# Patient Record
Sex: Male | Born: 1945 | Race: White | Hispanic: No | Marital: Married | Smoking: Former smoker
Health system: Southern US, Community
[De-identification: ages and names within clinical notes are randomized; demographics above are authoritative.]

## PROBLEM LIST (undated history)

## (undated) DIAGNOSIS — R140 Abdominal distension (gaseous): Secondary | ICD-10-CM

## (undated) DIAGNOSIS — R7301 Impaired fasting glucose: Secondary | ICD-10-CM

## (undated) DIAGNOSIS — E162 Hypoglycemia, unspecified: Secondary | ICD-10-CM

## (undated) DIAGNOSIS — G479 Sleep disorder, unspecified: Secondary | ICD-10-CM

## (undated) DIAGNOSIS — Z953 Presence of xenogenic heart valve: Secondary | ICD-10-CM

## (undated) DIAGNOSIS — R0683 Snoring: Secondary | ICD-10-CM

## (undated) DIAGNOSIS — R5383 Other fatigue: Secondary | ICD-10-CM

## (undated) DIAGNOSIS — K900 Celiac disease: Secondary | ICD-10-CM

## (undated) DIAGNOSIS — G4730 Sleep apnea, unspecified: Secondary | ICD-10-CM

## (undated) DIAGNOSIS — G4710 Hypersomnia, unspecified: Secondary | ICD-10-CM

## (undated) HISTORY — DX: Sleep disorder, unspecified: G47.9

## (undated) HISTORY — DX: Other fatigue: R53.83

## (undated) HISTORY — DX: Impaired fasting glucose: R73.01

## (undated) HISTORY — PX: TUMOR EXCISION: SHX421

## (undated) HISTORY — DX: Hypoglycemia, unspecified: E16.2

## (undated) HISTORY — DX: Sleep apnea, unspecified: G47.30

## (undated) HISTORY — DX: Hypersomnia, unspecified: G47.10

## (undated) HISTORY — DX: Snoring: R06.83

## (undated) HISTORY — DX: Abdominal distension (gaseous): R14.0

---

## 2006-10-24 ENCOUNTER — Emergency Department: Payer: Self-pay | Admitting: Internal Medicine

## 2006-10-24 ENCOUNTER — Other Ambulatory Visit: Payer: Self-pay

## 2006-11-08 ENCOUNTER — Other Ambulatory Visit: Payer: Self-pay

## 2006-11-08 ENCOUNTER — Emergency Department: Payer: Self-pay | Admitting: Emergency Medicine

## 2006-11-12 HISTORY — PX: AORTIC VALVE REPAIR: SHX6306

## 2007-04-24 DIAGNOSIS — K589 Irritable bowel syndrome without diarrhea: Secondary | ICD-10-CM | POA: Insufficient documentation

## 2007-04-24 DIAGNOSIS — F411 Generalized anxiety disorder: Secondary | ICD-10-CM | POA: Insufficient documentation

## 2008-02-08 IMAGING — CT CT HEAD WITHOUT CONTRAST
2 series · 16 of 30 positions shown, 20 images · non-contrast
Comparison: none

REASON FOR EXAM: Dizziness
COMMENTS:

PROCEDURE:     CT  - CT HEAD WITHOUT CONTRAST  - October 24, 2006 [DATE]
RESULT:
HISTORY: Dizziness.

[Series 2: without · axial · non-contrast · 0.41mm/px · z∈[+918,+1038]mm · 13 of 30 slices shown, 17 images]
[im 3/30  brain]
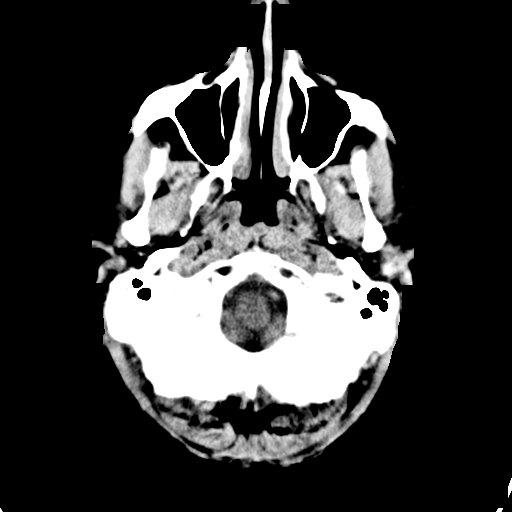
[im 3/30  bone]
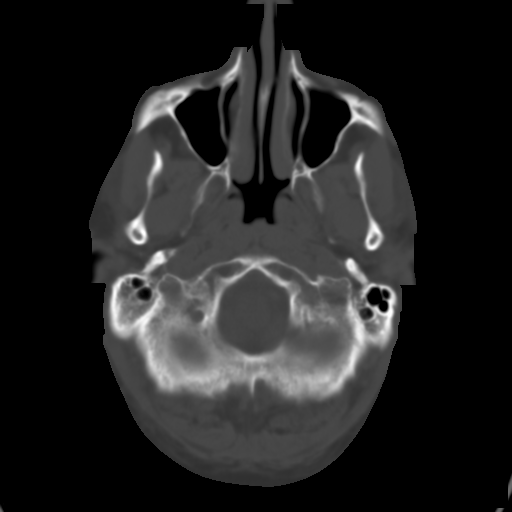
[im 5/30  brain]
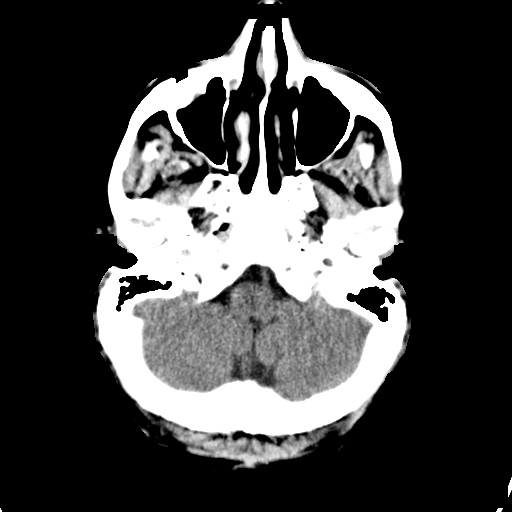
[im 7/30  brain]
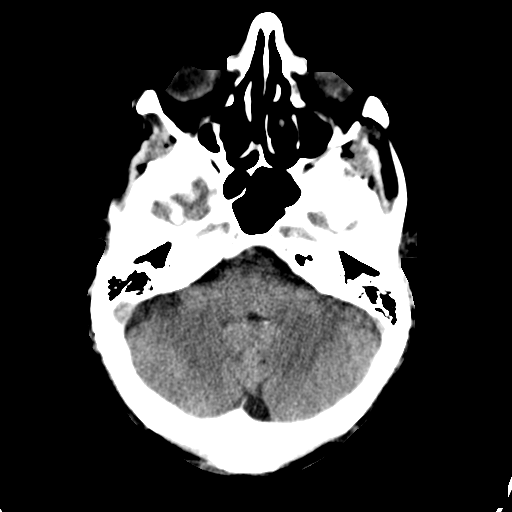
[im 9/30  brain]
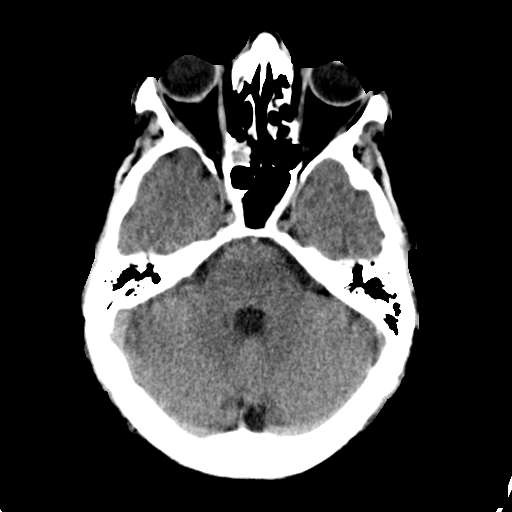
[im 11/30  brain]
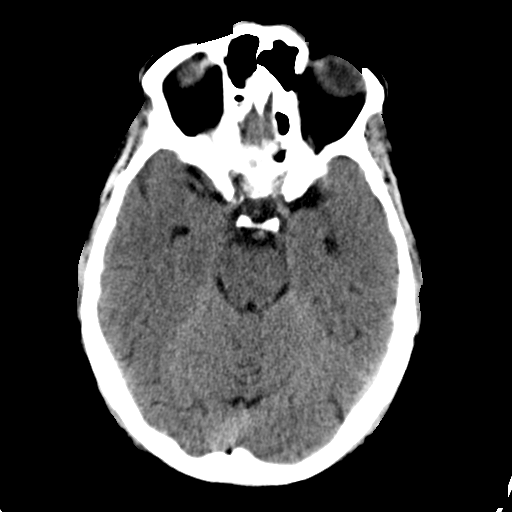
[im 11/30  bone]
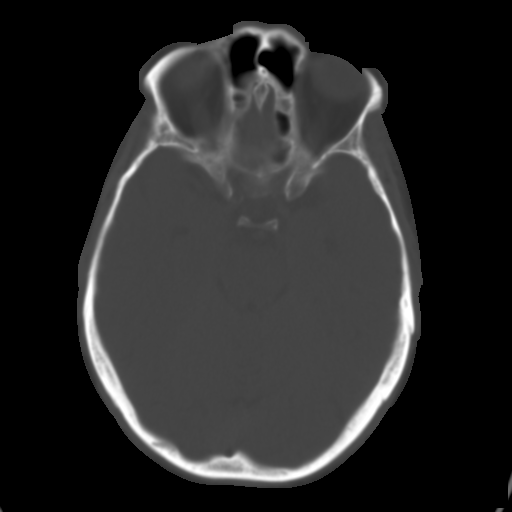
[im 13/30  brain]
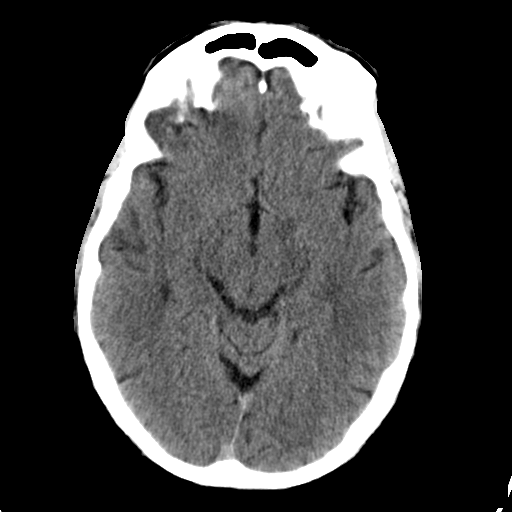
[im 15/30  brain]
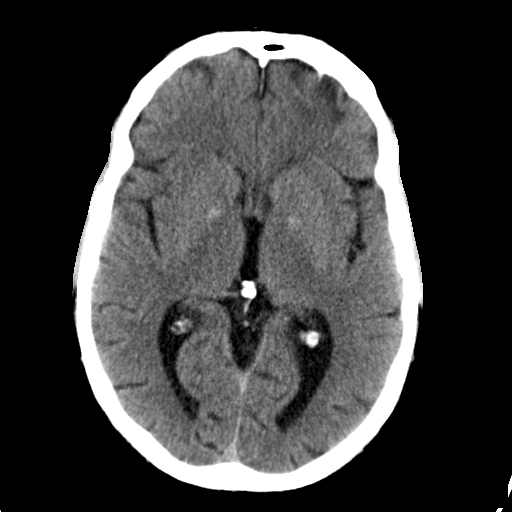
[im 17/30  brain]
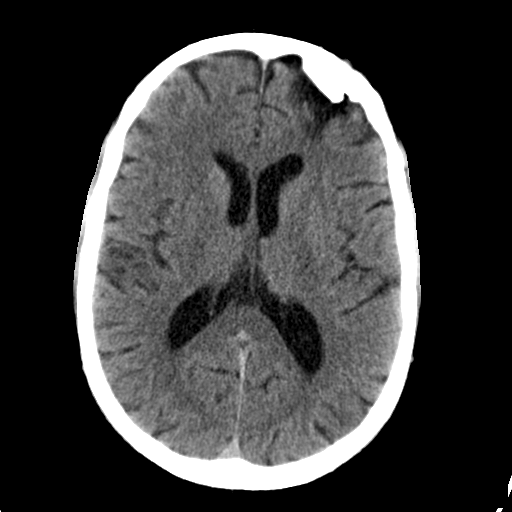
[im 19/30  brain]
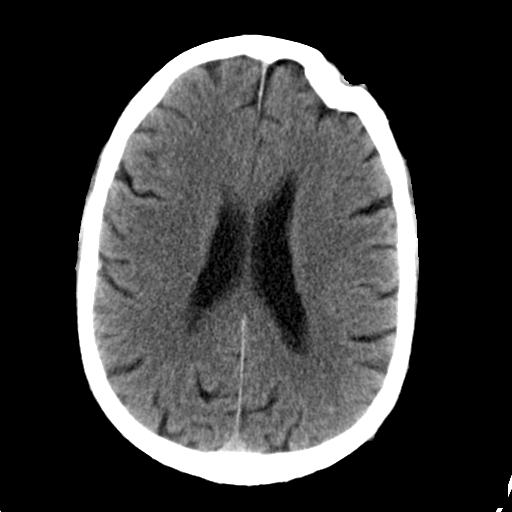
[im 19/30  bone]
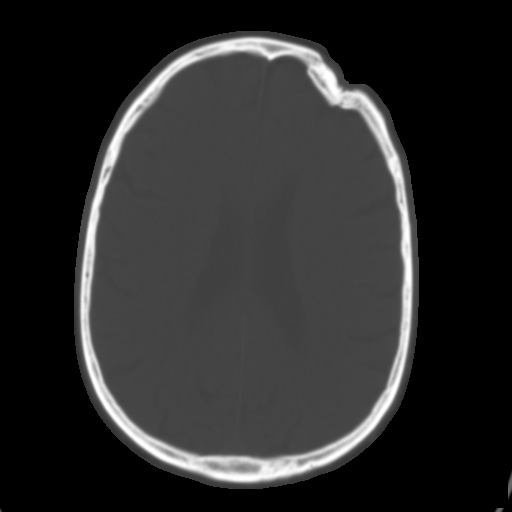
[im 21/30  brain]
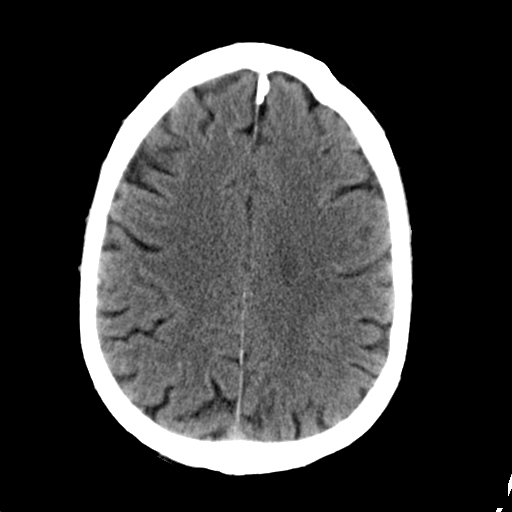
[im 23/30  brain]
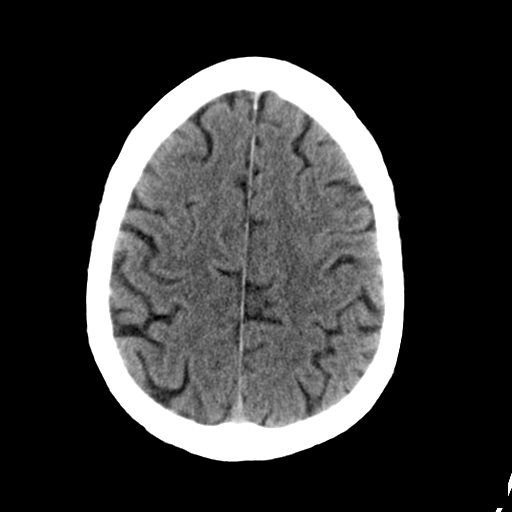
[im 25/30  brain]
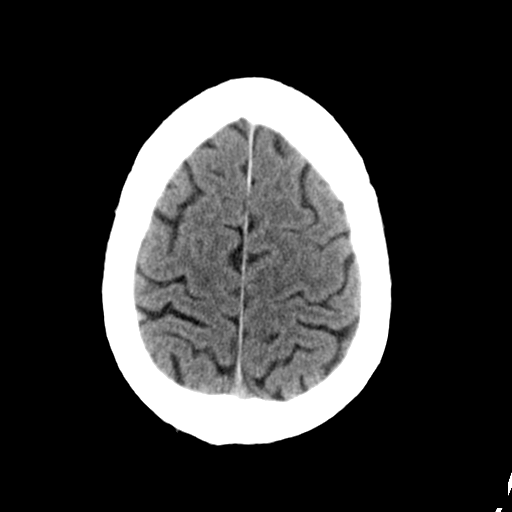
[im 27/30  brain]
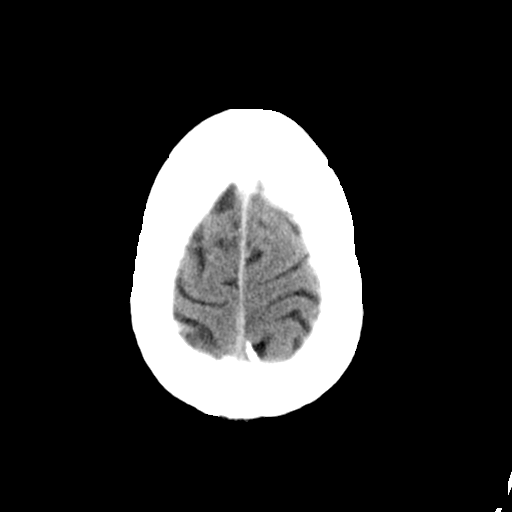
[im 27/30  bone]
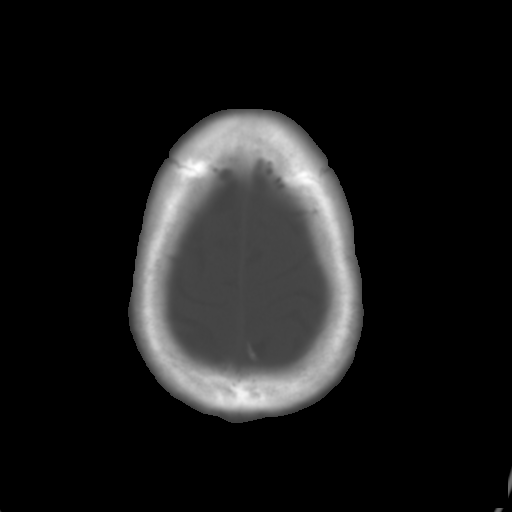

[Series 3: bone · axial · 0.41mm/px · z∈[+918,+958]mm · 3 of 30 slices shown]
[im 3/30  bone]
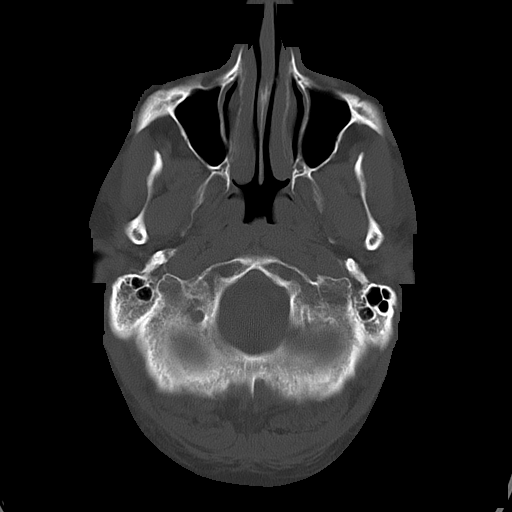
[im 7/30  bone]
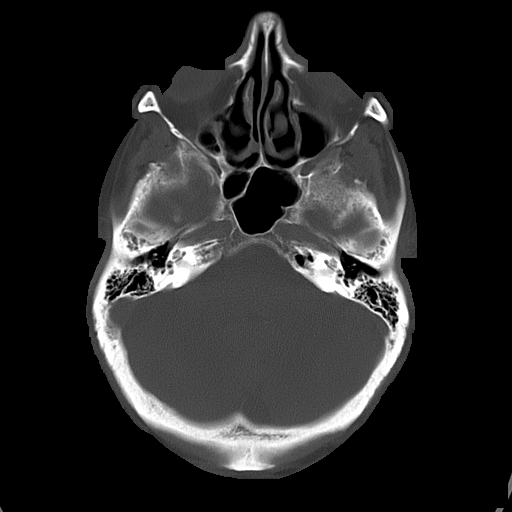
[im 11/30  bone]
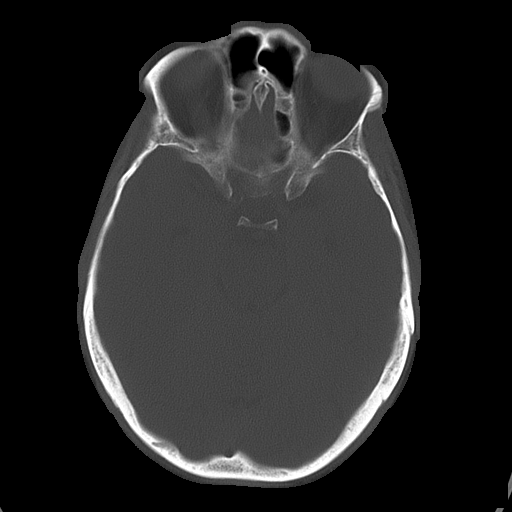

[16 of 30 positions shown; findings below may reference images not displayed]

FINDINGS: Standard nonenhanced Head CT is obtained.   The patient has a
fracture of the LEFT frontal lobe.   No evidence of mass lesion noted.
Bilateral basal ganglia calcification is noted.
IMPRESSION: 1.     Depressed LEFT frontal fracture with underlying mild
encephalomalacia.  The fracture may be old.  The fracture is nonunited.
2.     No acute intracranial abnormality is identified.

## 2008-09-13 HISTORY — PX: CATARACT EXTRACTION: SUR2

## 2011-02-05 DIAGNOSIS — Z954 Presence of other heart-valve replacement: Secondary | ICD-10-CM | POA: Insufficient documentation

## 2013-06-20 ENCOUNTER — Emergency Department: Payer: Self-pay | Admitting: Internal Medicine

## 2013-06-20 LAB — CBC
HGB: 15.1 g/dL (ref 13.0–18.0)
MCH: 31.6 pg (ref 26.0–34.0)
MCV: 89 fL (ref 80–100)
Platelet: 140 10*3/uL — ABNORMAL LOW (ref 150–440)
RBC: 4.78 10*6/uL (ref 4.40–5.90)
WBC: 7.3 10*3/uL (ref 3.8–10.6)

## 2013-06-20 LAB — TROPONIN I: Troponin-I: <0.02 ng/mL

## 2013-06-20 LAB — COMPREHENSIVE METABOLIC PANEL
Albumin: 3.8 g/dL (ref 3.4–5.0)
Anion Gap: 3 — ABNORMAL LOW (ref 7–16)
Bilirubin,Total: 0.3 mg/dL (ref 0.2–1.0)
Calcium, Total: 8.7 mg/dL (ref 8.5–10.1)
Chloride: 106 mmol/L (ref 98–107)
Creatinine: 1.03 mg/dL (ref 0.60–1.30)
EGFR (Non-African Amer.): >60
Glucose: 128 mg/dL — ABNORMAL HIGH (ref 65–99)
Osmolality: 276 (ref 275–301)
Potassium: 4 mmol/L (ref 3.5–5.1)
Sodium: 137 mmol/L (ref 136–145)
Total Protein: 6.9 g/dL (ref 6.4–8.2)

## 2013-06-20 LAB — URINALYSIS, COMPLETE
Bilirubin,UR: NEGATIVE
Blood: NEGATIVE
Glucose,UR: NEGATIVE mg/dL (ref 0–75)
Ketone: NEGATIVE
Nitrite: NEGATIVE
Ph: 5 (ref 4.5–8.0)
RBC,UR: NONE SEEN /HPF (ref 0–5)
Specific Gravity: 1.005 (ref 1.003–1.030)
Squamous Epithelial: NONE SEEN
WBC UR: <1 /HPF (ref 0–5)

## 2013-06-22 ENCOUNTER — Encounter: Payer: Self-pay | Admitting: Neurology

## 2013-06-22 ENCOUNTER — Encounter (INDEPENDENT_AMBULATORY_CARE_PROVIDER_SITE_OTHER): Payer: Self-pay

## 2013-06-22 ENCOUNTER — Ambulatory Visit (INDEPENDENT_AMBULATORY_CARE_PROVIDER_SITE_OTHER): Payer: 59 | Admitting: Neurology

## 2013-06-22 VITALS — BP 131/69 | HR 70 | Resp 16 | Ht 69.0 in | Wt 170.0 lb

## 2013-06-22 DIAGNOSIS — R0609 Other forms of dyspnea: Secondary | ICD-10-CM

## 2013-06-22 DIAGNOSIS — R351 Nocturia: Secondary | ICD-10-CM

## 2013-06-22 DIAGNOSIS — Z954 Presence of other heart-valve replacement: Secondary | ICD-10-CM

## 2013-06-22 DIAGNOSIS — R0683 Snoring: Secondary | ICD-10-CM

## 2013-06-22 DIAGNOSIS — G4710 Hypersomnia, unspecified: Secondary | ICD-10-CM | POA: Insufficient documentation

## 2013-06-22 HISTORY — DX: Snoring: R06.83

## 2013-06-22 HISTORY — DX: Hypersomnia, unspecified: G47.10

## 2013-06-22 NOTE — Progress Notes (Signed)
[facility]  Provider:  Melvyn Novas, M D  Referring Provider: Betsey Holiday, MD Primary Care Physician:  Betsey Holiday, MD      HPI:  Randy Duncan is a caucasian , right handed , 67 y.o. male  Is seen here as a referral  from Dr.John Earlene Plater for a sleep consultation.   Randy Duncan is seen here today for a sleep consultation based on a clinical symptom constellation of morning headaches, excessive fatigue, excessive daytime sleepiness, fatigue on exertion. He snores loudly according to his wife.   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 The patient wakes up with headaches but not from headaches. His sleep is not as restorative as it used to be His concern  was at a new valve leak or stenosis may have caused his current symptoms, this was not confirmed- his cardiologist told him he just yesterday that his tests look good and that the valve is functioning.  He also has an impaired fasting glucose but is not diabetic as of yet. He has sometimes hypoglycemic episodes, he complains of some abdominal bloating he seems to have irritable bowel symptoms. He just recently was tested by echocardiogram, comprehensive metabolic panel with thyroid panel hemoglobin A1c, and a CBC with differential was obtained all in normal range. His blood pressures remain at the high normal systolic range of 140 diastolic in the 70s, his pulse is regular in the 70s his body mass index is 28.5.  Randy Duncan reports that he felt too tired too fatigued to heavy even walk to his car and back, he has avoided climbing stairs, he states there is no limit from muscle tone, or shortness of breath on that also looked for pain. He just feels limited by fatigue. He takes Klonopin since 1994 1 mg tid po. This is taken  for anxiety, following the Thanksgiving 1993 - when his brother in law died attending the  dinner and his mother learnt she had breast-cancer .    Sleep habits - goes to bedroom  by 9 PM and falls asleep with the TV on , within one hour. He is too fatigued to move by that time.  He sleeps alone, due to his snoring. He has 2-4 bathroom breaks nightly, he wakes by his snoring. Dry mouth in the morning.  He can initiate sleep again, sometimes he is worried and will not sleep (rare) . He gets about 7 hours of sleep . He rises at 5.30 , wakes spontanousley, and has similar week days and week and times.  No daytime naps- but wifes reports he  Is dozing off within 15 minutes of sitting still any time , after work , on weekends.  He drinks de- caffeine caffee 2 in AM and one Pepsi with caffeine.  No regular exercise routine .    Review of Systems: Out of a complete 14 system review, the patient complains of only the following symptoms, and all other reviewed systems are negative.  snoring.  Fatigue,  Worried/anxious about finances.   History   Social History  . Marital Status: Married    Spouse Name: Harriett Sine    Number of Children: 3  . Years of Education: 14   Occupational History  .  Lab [employer]   Social History Main Topics  . Smoking status: Former Games developer  . Smokeless tobacco: Former Neurosurgeon     Comment:  quit 02/2012  . Alcohol Use: No     Comment: qluit in 2006  . Drug Use: No  . Sexual Activity: Not on file   Other Topics Concern  . Not on file   Social History Narrative   Patient is right handed, consumes 1 soda daily,resides with wife and 2 adult children.    Family History  Problem Relation Age of Onset  . Celiac disease Daughter     Past Medical History  Diagnosis Date  . Hypoglycemia   . Snoring   . Sleep disorder   . Impaired fasting glucose   . Abdominal bloating   . Fatigue     Past Surgical History  Procedure Laterality Date  . Aortic valve repair  11/2006  . Cataract extraction Bilateral 2010    Current Outpatient Prescriptions  Medication Sig  Dispense Refill  . aspirin 81 MG tablet Take 81 mg by mouth daily.      . clonazePAM (KLONOPIN) 0.5 MG tablet        No current facility-administered medications for this visit.    Allergies as of 06/22/2013 - Review Complete 06/22/2013  Allergen Reaction Noted  . Keflex [cephalexin]  06/22/2013  . Wellbutrin [bupropion]  06/22/2013    Vitals: BP 131/69  Pulse 70  Resp 16  Ht 5\' 9"  (1.753 m)  Wt 170 lb (77.111 kg)  BMI 25.09 kg/m2 Last Weight:  Wt Readings from Last 1 Encounters:  06/22/13 170 lb (77.111 kg)   Last Height:   Ht Readings from Last 1 Encounters:  06/22/13 5\' 9"  (1.753 m)    Physical exam:  General: The patient is awake, alert and appears not in acute distress. The patient is well groomed. Head: Normocephalic, atraumatic. Neck is supple. Mallampati 4 , neck circumference: 15 ,  Cardiovascular:  Regular rate and rhythm, without carotid bruit, and without distended neck veins. Respiratory: Lungs are clear to auscultation. Skin:  Without evidence of edema, or rash Trunk: BMI is elevated and patient  has normal posture.  Neurologic exam : The patient is awake and alert, oriented to place and time.  Memory subjective  described as intact.  There is a normal attention span & concentration ability. Speech is fluent without   dysarthria, dysphonia or aphasia. Mood and affect are appropriate.  Cranial nerves: Pupils are equal and briskly reactive to light. Funduscopic exam without  evidence of pallor or edema.  Extraocular movements  in vertical and horizontal planes intact and without nystagmus. Visual fields by finger perimetry are intact. Hearing to finger rub intact.  Facial sensation intact to fine touch. Facial motor strength is symmetric and tongue and uvula move midline.  Motor exam: Normal tone and normal muscle bulk and symmetric normal strength in all extremities.  Sensory:  Fine touch, pinprick and vibration were tested in all extremities.  Proprioception is tested in the upper extremities  normal.  Coordination: Rapid alternating movements in the fingers/hands is tested and normal. Finger-to-nose maneuver tested and normal without evidence of ataxia, dysmetria or tremor.  Gait and station: Patient walks without assistive device -Strength within normal limits. Stance is stable and normal. Tandem gait is  unfragmented. Romberg testing is  normal.  Deep tendon reflexes: in the  upper and lower extremities are symmetric and intact. Babinski maneuver response is downgoing.   Assessment:  After physical and neurologic examination, review of laboratory studies, imaging, neurophysiology testing and pre-existing records, assessment :  Snoring , nocturia and suspected OSA - Epworth score and  FSS score high. Epworth score is and was at 11 points, the fatigue severity score is very highly rated at 55 points.  Heart valve  Is functioning well. TSH and HBAIc were not abnormal.    Plan:  Treatment plan and additional workup : SPLIT night study. Sleep hygiene , information and implementation.

## 2013-06-22 NOTE — Patient Instructions (Signed)
Hypersomnia Hypersomnia usually brings recurrent episodes of excessive daytime sleepiness or prolonged nighttime sleep. It is different than feeling tired due to lack of or interrupted sleep at night. People with hypersomnia are compelled to nap repeatedly during the day. This is often at inappropriate times such as:  At work.  During a meal.  In conversation. These daytime naps usually provide no relief. This disorder typically affects adolescents and young adults. CAUSES  This condition may be caused by:  Another sleep disorder (such as narcolepsy or sleep apnea).  Dysfunction of the autonomic nervous system.  Drug or alcohol abuse.  A physical problem, such as:  A tumor.  Head trauma. This is damage caused by an accident.  Injury to the central nervous system.  Certain medications, or medicine withdrawal.  Medical conditions may contribute to the disorder, including:  Multiple sclerosis.  Depression.  Encephalitis.  Epilepsy.  Obesity.  Some people appear to have a genetic predisposition to this disorder. In others, there is no known cause. SYMPTOMS   Patients often have difficulty waking from a long sleep. They may feel dazed or confused.  Other symptoms may include:  Anxiety.  Increased irritation (inflammation).  Decreased energy.  Restlessness.  Slow thinking.  Slow speech.  Loss of appetite.  Hallucinations.  Memory difficulty.  Tremors, Tics.  Some patients lose the ability to function in family, social, occupational, or other settings. TREATMENT  Treatment is symptomatic in nature. Stimulants and other drugs may be used to treat this disorder. Changes in behavior may help. For example, avoid night work and social activities that delay bed time. Changes in diet may offer some relief. Patients should avoid alcohol and caffeine. PROGNOSIS  The likely outcome (prognosis) for persons with hypersomnia depends on the cause of the disorder.  The disorder itself is not life threatening. But it can have serious consequences. For example, automobile accidents can be caused by falling asleep while driving. The attacks usually continue indefinitely. Document Released: 08/20/2002 Document Revised: 11/22/2011 Document Reviewed: 07/24/2008 Exit[employer] Patient Information 2014 [employer].  

## 2013-07-08 ENCOUNTER — Ambulatory Visit (INDEPENDENT_AMBULATORY_CARE_PROVIDER_SITE_OTHER): Payer: 59

## 2013-07-08 DIAGNOSIS — R0683 Snoring: Secondary | ICD-10-CM

## 2013-07-08 DIAGNOSIS — G4733 Obstructive sleep apnea (adult) (pediatric): Secondary | ICD-10-CM

## 2013-07-08 DIAGNOSIS — R351 Nocturia: Secondary | ICD-10-CM

## 2013-07-08 DIAGNOSIS — G4710 Hypersomnia, unspecified: Secondary | ICD-10-CM

## 2013-07-08 DIAGNOSIS — Z954 Presence of other heart-valve replacement: Secondary | ICD-10-CM

## 2013-07-19 ENCOUNTER — Telehealth: Payer: Self-pay | Admitting: Neurology

## 2013-07-19 NOTE — Telephone Encounter (Signed)
I called and left a message for the patient to callback to the office to speak with me Jasmine December) about his sleep study results.

## 2013-07-23 ENCOUNTER — Telehealth: Payer: Self-pay | Admitting: Neurology

## 2013-07-23 NOTE — Telephone Encounter (Signed)
I called and spoke with the patient about his sleep study results. I informed the patient that the didn't reveal any obstructive sleep apnea or central sleep apnea, but it did reveal UARS and Dr. Vickey Huger would like for him to see his dentist about using dental device. Patient understood and I will mail a copy to him and fax a copy to Dr. Duayne Cal office.

## 2013-07-24 ENCOUNTER — Encounter: Payer: Self-pay | Admitting: *Deleted

## 2018-02-01 DIAGNOSIS — R918 Other nonspecific abnormal finding of lung field: Secondary | ICD-10-CM | POA: Insufficient documentation

## 2018-08-20 ENCOUNTER — Encounter (HOSPITAL_COMMUNITY): Payer: Self-pay | Admitting: *Deleted

## 2018-08-20 ENCOUNTER — Other Ambulatory Visit: Payer: Self-pay

## 2018-08-20 ENCOUNTER — Emergency Department (HOSPITAL_COMMUNITY)
Admission: EM | Admit: 2018-08-20 | Discharge: 2018-08-20 | Disposition: A | Discharge status: Home / Self Care | Payer: Medicare HMO | Attending: Emergency Medicine | Admitting: Emergency Medicine

## 2018-08-20 DIAGNOSIS — R29898 Other symptoms and signs involving the musculoskeletal system: Secondary | ICD-10-CM

## 2018-08-20 DIAGNOSIS — Z87891 Personal history of nicotine dependence: Secondary | ICD-10-CM | POA: Insufficient documentation

## 2018-08-20 DIAGNOSIS — R030 Elevated blood-pressure reading, without diagnosis of hypertension: Secondary | ICD-10-CM | POA: Diagnosis not present

## 2018-08-20 DIAGNOSIS — R002 Palpitations: Secondary | ICD-10-CM

## 2018-08-20 DIAGNOSIS — Z7982 Long term (current) use of aspirin: Secondary | ICD-10-CM | POA: Diagnosis not present

## 2018-08-20 DIAGNOSIS — R531 Weakness: Secondary | ICD-10-CM | POA: Diagnosis not present

## 2018-08-20 HISTORY — DX: Celiac disease: K90.0

## 2018-08-20 LAB — URINALYSIS, ROUTINE W REFLEX MICROSCOPIC
Bilirubin Urine: NEGATIVE
Glucose, UA: NEGATIVE mg/dL
Hgb urine dipstick: NEGATIVE
Ketones, ur: NEGATIVE mg/dL
LEUKOCYTES UA: NEGATIVE
Nitrite: NEGATIVE
Protein, ur: NEGATIVE mg/dL
Specific Gravity, Urine: 1.006 (ref 1.005–1.030)
pH: 5 (ref 5.0–8.0)

## 2018-08-20 LAB — CBC WITH DIFFERENTIAL/PLATELET
Abs Immature Granulocytes: 0.08 10*3/uL — ABNORMAL HIGH (ref 0.00–0.07)
Basophils Absolute: 0 10*3/uL (ref 0.0–0.1)
Basophils Relative: 0 %
Eosinophils Absolute: 0 10*3/uL (ref 0.0–0.5)
Eosinophils Relative: 0 %
HCT: 45.5 % (ref 39.0–52.0)
HEMOGLOBIN: 15.6 g/dL (ref 13.0–17.0)
Immature Granulocytes: 1 %
Lymphocytes Relative: 19 %
Lymphs Abs: 2.6 10*3/uL (ref 0.7–4.0)
MCH: 31.3 pg (ref 26.0–34.0)
MCHC: 34.3 g/dL (ref 30.0–36.0)
MCV: 91.2 fL (ref 80.0–100.0)
MONO ABS: 0.8 10*3/uL (ref 0.1–1.0)
Monocytes Relative: 6 %
Neutro Abs: 10.2 10*3/uL — ABNORMAL HIGH (ref 1.7–7.7)
Neutrophils Relative %: 74 %
Platelets: 174 10*3/uL (ref 150–400)
RBC: 4.99 MIL/uL (ref 4.22–5.81)
RDW: 11.8 % (ref 11.5–15.5)
WBC: 13.8 10*3/uL — ABNORMAL HIGH (ref 4.0–10.5)
nRBC: 0 % (ref 0.0–0.2)

## 2018-08-20 LAB — BASIC METABOLIC PANEL
Anion gap: 10 (ref 5–15)
BUN: 13 mg/dL (ref 8–23)
CHLORIDE: 105 mmol/L (ref 98–111)
CO2: 24 mmol/L (ref 22–32)
Calcium: 9.7 mg/dL (ref 8.9–10.3)
Creatinine, Ser: 0.86 mg/dL (ref 0.61–1.24)
GFR calc Af Amer: >60 mL/min (ref >60)
GFR calc non Af Amer: >60 mL/min (ref >60)
GLUCOSE: 120 mg/dL — AB (ref 70–99)
Potassium: 3.8 mmol/L (ref 3.5–5.1)
Sodium: 139 mmol/L (ref 135–145)

## 2018-08-20 LAB — TSH: TSH: 1.785 u[IU]/mL (ref 0.350–4.500)

## 2018-08-20 LAB — MAGNESIUM: Magnesium: 2.5 mg/dL — ABNORMAL HIGH (ref 1.7–2.4)

## 2018-08-20 NOTE — Discharge Instructions (Addendum)
You were seen in the ER for palpitations and elevated blood pressure reading.  You also reported overall low energy, leg weakness.  Work-up today is reassuring.  No signs of urinary tract infection.  Your electrolytes are normal.  Your thyroid blood work is normal as well.  The cause of your palpitations may be from recent medication changes.  We will defer any further medication adjustment to your primary cardiology and primary care team.  Continue tracking and logging her blood pressure readings at home.  Follow-up with cardiology as soon as possible for further discussion of your palpitations, elevated blood pressure.  I recommend you also contact psychiatrist to further discuss recent medication changes and possible side effects.  Call your primary care doctor to make an appointment for further discussion if you continue to have leg weakness, low energy.

## 2018-08-20 NOTE — ED Provider Notes (Signed)
[facility]-EMERGENCY DEPT Provider Note   CSN: [id number] Arrival date & time: 08/20/18  1855     History   Chief Complaint Chief Complaint  Patient presents with  . Hypertension    HPI Randy Duncan is a 72 y.o. male with history of aortic valve replacement, anxiety, depression, insomnia, former smoker is here for evaluation of elevated blood pressure and palpitations.  Patient states he started feeling nighttime palpitations in the beginning of November.  Palpitations are intermittent and occur only in the middle of the night when he gets up to urinate.  Initially he thought it was because he stopped taking Remeron on October 16.  He made an appointment to speak to his cardiologist about this and saw Dr. Leeann Must 2 days ago.  Dr. Wendie Simmer recommended observation and monitoring of his BP and follow-up in the office if it did not improve.  Patient has been very vigilant about his blood pressure and he brings 3 pages of BP logs.  His blood pressure readings range from 140-210 systolic, 60-111 diastolic over the last 2 days.  Tonight his blood pressure was 200 systolic and he called cardiology emergency line, the RN told him to come to the ER.  He has never been on antihypertensive medications before.  He has associated leg weakness for 1 week, he describes this as his legs feeling heavy and weaker.  He overall feels less energy.  He had similar symptoms 1 years ago when he was told he had vitamin D deficiency.  He has been compliant with vitamin D supplementation but recently stopped taking a multivitamin.  Since stopping Remeron he has noticed his anxiety is worse in his sleeping pattern has been more irregular.  Wife at bedside states he appears to be more anxious. No recent changes to caffeine intake. No tobacco use.    He denies any fevers, cough, chest pain, shortness of breath, headache, vision changes, abdominal or back pain.  No interventions.  No alleviating or  aggravating factors. HPI  Past Medical History:  Diagnosis Date  . Abdominal bloating   . Celiac disease   . Fatigue   . 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 Hypoglycemia   . Impaired fasting glucose   . Sleep disorder   . Snoring   . Snoring 06/22/2013    Patient Active Problem List   Diagnosis Date Noted  . 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 Snoring 06/22/2013    Past Surgical History:  Procedure Laterality Date  . AORTIC VALVE REPAIR  11/2006  . CATARACT EXTRACTION Bilateral 2010  . TUMOR EXCISION     Left parotid gland ([place] in [place]        Home Medications    Prior to Admission medications   Medication Sig Start Date End Date Taking? Authorizing Provider  aspirin 81 MG tablet Take 81 mg by mouth daily.   Yes [provider]  clonazePAM (KLONOPIN) 0.5 MG tablet Take 0.5 mg by mouth 3 (three) times daily.  05/10/13  Yes [provider]    Family History Family History  Problem Relation Age of Onset  . Celiac disease Daughter     Social History Social History   Tobacco Use  . Smoking status: Former Games developer  . Smokeless tobacco: Never Used  . Tobacco comment: quit 02/2012  Substance Use Topics  . Alcohol use: No    Comment: quit in 2006  . Drug use: No     Allergies  Keflex [cephalexin] and Wellbutrin [bupropion]   Review of Systems Review of Systems  Cardiovascular: Positive for palpitations.  Neurological: Positive for weakness.  Psychiatric/Behavioral: Positive for sleep disturbance. The patient is nervous/anxious.   All other systems reviewed and are negative.    Physical Exam Updated Vital Signs BP (!) 173/86   Pulse 66   Temp (!) 97.5 F (36.4 C) (Oral)   Resp 13   Ht 5\' 7"  (1.702 m)   Wt 82.6 kg   SpO2 96%   BMI 28.51 kg/m   Physical Exam  Constitutional: He is oriented to person, place, and time. He appears well-developed and well-nourished.  No distress. Non  toxic.   HENT:  Head: Normocephalic and atraumatic.  Nose: Nose normal.  Moist mucous membranes. Oropharynx and tonsils normal.   Eyes: Pupils are equal, round, and reactive to light. EOM are normal.  Neck: Neck supple.  Cardiovascular: Normal rate and regular rhythm.  RRR. No S3.   2+ DP, radial and femoral pulses bilaterally No LE edema  Pulmonary/Chest: Effort normal and breath sounds normal. No respiratory distress.  Lungs CTAB. No crackles.  Abdominal: Soft. Bowel sounds are normal. There is no tenderness.  No obvious distention.   Neurological: He is alert and oriented to person, place, and time.  Speech is fluent without obvious dysarthria or aphasia. Strength 5/5 in upper and lower extremities   Sensation to light touch intact in bilateral face, upper and lower extremities No pronator drift. No leg drop.  CN II-XII grossly intact bilaterally.  Skin: Skin is warm and dry. Capillary refill takes less than 2 seconds.  Psychiatric: He has a normal mood and affect. His behavior is normal. Judgment and thought content normal.     ED Treatments / Results  Labs (all labs ordered are listed, but only abnormal results are displayed) Labs Reviewed  CBC WITH DIFFERENTIAL/PLATELET - Abnormal; Notable for the following components:      Result Value   WBC 13.8 (*)    Neutro Abs 10.2 (*)    Abs Immature Granulocytes 0.08 (*)    All other components within normal limits  BASIC METABOLIC PANEL - Abnormal; Notable for the following components:   Glucose, Bld 120 (*)    All other components within normal limits  MAGNESIUM - Abnormal; Notable for the following components:   Magnesium 2.5 (*)    All other components within normal limits  URINALYSIS, ROUTINE W REFLEX MICROSCOPIC - Abnormal; Notable for the following components:   Color, Urine STRAW (*)    All other components within normal limits  TSH  T4, FREE    EKG EKG Interpretation  Date/Time:  Sunday August 20 2018  20:51:31 EST Ventricular Rate:  80 PR Interval:    QRS Duration: 92 QT Interval:  393 QTC Calculation: 454 R Axis:   21 Text Interpretation:  Sinus rhythm Anteroseptal infarct, age indeterminate Confirmed by Benjiman CorePickering, Nathan [phone]([id number]) on 08/20/2018 10:58:12 PM   Radiology No results found.  Procedures Procedures (including critical care time)  Medications Ordered in ED Medications - No data to display   Initial Impression / Assessment and Plan / ED Course  I have reviewed the triage vital signs and the nursing notes.  Pertinent labs & imaging results that were available during my care of the patient were reviewed by me and considered in my medical decision making (see chart for details).    [age] year old is here for elevated blood pressure readings.  History, work-up in the ER consistent  with uncomplicated, asymptomatic hypertension.  He was seen by cardiology 2 days ago.  I reviewed cardiology note.  Dr. Leeann Must suspects patient's palpitations are from increased cardiac awareness from coming off of Remeron.  Reported BP range 140-210/60-111 over last two days.  BP range in ER 140-200/73-102.  He reports associated palpitations.  No clinical features to suggest end organ dysfunction: no thunderclap HA, seizures, stroke symptoms, vision changes, CP, SOB, cough, orthopnea, LE edema, back/abdominal pain, hematuria. Given reported leg weakness, age, lack of infectious symptoms I will add TSH/T4, UA to r/o infection.  I do not think we need CXR, he has no fever, cough, CP, tachycardia, tachypnea, hypoxia and I doubt PNA.  His neuro exam is grossly normal without asymmetric weakness in extremities, I doubt CVA or CNS pathology given overall presentation.   2253: Creatinine normal. UA without infection. Electrolytes WNL. TSH WNL.  No arrhythmias on EKG, intermittent PVCs while on monitor as previously noted by cardiology.  Work up today in ER is not suggestive of acute end organ damage.  I do not  think there is indication for aggressive BP control or further emergent work up.  I considered initiating antihypertensives but given pt's underlying worsening anxiety, it is unclear if this could be contributing.  I would prefer to have pt f/u with his cardiologist, psychiatrist for any further medication adjustments.  Will dc with f/u with cardiology within one week for recheck of BP for any needed med changes.  His palpitations may be multifactorial from anxiety vs PVCs.  Discussed with Dr Rubin Payor. Pt and wife are comfortable with close f/u with cards/PCP/psych. Return preacutions given. I will message his outpatient physicians to facilitate f/u for further discussion of palpitations, elevated BP, generalized/leg weakness and low energy.   Final Clinical Impressions(s) / ED Diagnoses   Final diagnoses:  Elevated blood pressure reading in office without diagnosis of hypertension  Leg weakness, bilateral    ED Discharge Orders    None       Jerrell Mylar 08/20/18 2309    Benjiman Core, MD 08/21/18 [phone]

## 2018-08-20 NOTE — ED Triage Notes (Signed)
Pt presents with high BP that has been increasing over the past 3 weeks.  Pt was told to stop taking his Remeron. Pt up to this point, pt did not need any BP medication except when he was recovering from his valve surgery that occurred on 11/23/06.  Pt a/o x 4 and ambulatory. Pt reports no pain but feels weak.

## 2018-08-21 LAB — T4, FREE: Free T4: 0.94 ng/dL (ref 0.82–1.77)

## 2018-10-02 DIAGNOSIS — I10 Essential (primary) hypertension: Secondary | ICD-10-CM | POA: Diagnosis present

## 2018-10-25 DIAGNOSIS — I2510 Atherosclerotic heart disease of native coronary artery without angina pectoris: Secondary | ICD-10-CM | POA: Diagnosis present

## 2019-10-05 ENCOUNTER — Ambulatory Visit: Payer: Medicare HMO

## 2019-10-05 ENCOUNTER — Ambulatory Visit: Payer: Medicare HMO | Attending: Internal Medicine

## 2019-10-05 DIAGNOSIS — Z23 Encounter for immunization: Secondary | ICD-10-CM | POA: Insufficient documentation

## 2019-10-05 NOTE — Progress Notes (Signed)
   Covid-19 Vaccination Clinic  Name:  Randy Duncan    MRN: [id number] DOB: August 05, 1946  10/05/2019  [name] was observed post Covid-19 immunization for 15 minutes without incidence. He was provided with Vaccine Information Sheet and instruction to access the V-Safe system.   [name] was instructed to call 911 with any severe reactions post vaccine: [name] Difficulty breathing  . Swelling of your face and throat  . A fast heartbeat  . A bad rash all over your body  . Dizziness and weakness    Immunizations Administered    Name Date Dose VIS Date Route   Pfizer COVID-19 Vaccine 10/05/2019 11:18 AM 0.3 mL 08/24/2019 Intramuscular   Manufacturer: [employer], [employer]   Lot: [id number]   NDC: [address]-8

## 2019-10-26 ENCOUNTER — Ambulatory Visit: Payer: Medicare HMO | Attending: Internal Medicine

## 2019-10-26 ENCOUNTER — Ambulatory Visit: Payer: Medicare HMO

## 2019-10-26 DIAGNOSIS — Z23 Encounter for immunization: Secondary | ICD-10-CM | POA: Insufficient documentation

## 2019-10-26 NOTE — Progress Notes (Signed)
   Covid-19 Vaccination Clinic  Name:  Maude Gloor    MRN: [id number] DOB: May 22, 1946  10/26/2019  Mr. Mian was observed post Covid-19 immunization for 15 minutes without incidence. He was provided with Vaccine Information Sheet and instruction to access the V-Safe system.   Mr. Costales was instructed to call 911 with any severe reactions post vaccine: [name] Difficulty breathing  . Swelling of your face and throat  . A fast heartbeat  . A bad rash all over your body  . Dizziness and weakness    Immunizations Administered    Name Date Dose VIS Date Route   Pfizer COVID-19 Vaccine 10/26/2019 10:40 AM 0.3 mL 08/24/2019 Intramuscular   Manufacturer: [employer], [employer]   Lot: [id number]   NDC: [address]-8

## 2020-06-05 DIAGNOSIS — F1320 Sedative, hypnotic or anxiolytic dependence, uncomplicated: Secondary | ICD-10-CM | POA: Insufficient documentation

## 2022-01-28 DIAGNOSIS — J432 Centrilobular emphysema: Secondary | ICD-10-CM | POA: Insufficient documentation

## 2022-05-07 DIAGNOSIS — D61818 Other pancytopenia: Secondary | ICD-10-CM | POA: Insufficient documentation

## 2023-01-19 ENCOUNTER — Emergency Department (HOSPITAL_COMMUNITY)
Admission: EM | Admit: 2023-01-19 | Discharge: 2023-01-19 | Disposition: A | Discharge status: Home / Self Care | Payer: Medicare HMO | Attending: Emergency Medicine | Admitting: Emergency Medicine

## 2023-01-19 ENCOUNTER — Encounter (HOSPITAL_COMMUNITY): Payer: Self-pay

## 2023-01-19 ENCOUNTER — Emergency Department (HOSPITAL_COMMUNITY): Payer: Medicare HMO

## 2023-01-19 ENCOUNTER — Other Ambulatory Visit: Payer: Self-pay

## 2023-01-19 DIAGNOSIS — R531 Weakness: Secondary | ICD-10-CM | POA: Insufficient documentation

## 2023-01-19 DIAGNOSIS — Z7982 Long term (current) use of aspirin: Secondary | ICD-10-CM | POA: Insufficient documentation

## 2023-01-19 DIAGNOSIS — R5383 Other fatigue: Secondary | ICD-10-CM | POA: Diagnosis not present

## 2023-01-19 DIAGNOSIS — R0602 Shortness of breath: Secondary | ICD-10-CM | POA: Diagnosis not present

## 2023-01-19 DIAGNOSIS — D696 Thrombocytopenia, unspecified: Secondary | ICD-10-CM | POA: Insufficient documentation

## 2023-01-19 DIAGNOSIS — M25512 Pain in left shoulder: Secondary | ICD-10-CM | POA: Insufficient documentation

## 2023-01-19 LAB — URINALYSIS, ROUTINE W REFLEX MICROSCOPIC
Bilirubin Urine: NEGATIVE
Glucose, UA: NEGATIVE mg/dL
Hgb urine dipstick: NEGATIVE
Ketones, ur: NEGATIVE mg/dL
Leukocytes,Ua: NEGATIVE
Nitrite: NEGATIVE
Protein, ur: NEGATIVE mg/dL
Specific Gravity, Urine: 1.002 — ABNORMAL LOW (ref 1.005–1.030)
pH: 7 (ref 5.0–8.0)

## 2023-01-19 LAB — BASIC METABOLIC PANEL
Anion gap: 6 (ref 5–15)
BUN: 16 mg/dL (ref 8–23)
CO2: 25 mmol/L (ref 22–32)
Calcium: 9.2 mg/dL (ref 8.9–10.3)
Chloride: 107 mmol/L (ref 98–111)
Creatinine, Ser: 1.09 mg/dL (ref 0.61–1.24)
GFR, Estimated: >60 mL/min (ref >60)
Glucose, Bld: 113 mg/dL — ABNORMAL HIGH (ref 70–99)
Potassium: 4.4 mmol/L (ref 3.5–5.1)
Sodium: 138 mmol/L (ref 135–145)

## 2023-01-19 LAB — BRAIN NATRIURETIC PEPTIDE: B Natriuretic Peptide: 229.2 pg/mL — ABNORMAL HIGH (ref 0.0–100.0)

## 2023-01-19 LAB — CBC
HCT: 36.9 % — ABNORMAL LOW (ref 39.0–52.0)
Hemoglobin: 12.7 g/dL — ABNORMAL LOW (ref 13.0–17.0)
MCH: 34 pg (ref 26.0–34.0)
MCHC: 34.4 g/dL (ref 30.0–36.0)
MCV: 98.9 fL (ref 80.0–100.0)
Platelets: 117 10*3/uL — ABNORMAL LOW (ref 150–400)
RBC: 3.73 MIL/uL — ABNORMAL LOW (ref 4.22–5.81)
RDW: 11.9 % (ref 11.5–15.5)
WBC: 6.6 10*3/uL (ref 4.0–10.5)
nRBC: 0 % (ref 0.0–0.2)

## 2023-01-19 LAB — TROPONIN I (HIGH SENSITIVITY): Troponin I (High Sensitivity): 11 ng/L (ref <18)

## 2023-01-19 LAB — CBG MONITORING, ED: Glucose-Capillary: 92 mg/dL (ref 70–99)

## 2023-01-19 MED ORDER — SODIUM CHLORIDE 0.9 % IV BOLUS
500.0000 mL | Freq: Once | INTRAVENOUS | Status: AC
  Administered 2023-01-19: 500 mL via INTRAVENOUS

## 2023-01-19 NOTE — ED Provider Notes (Signed)
I provided a substantive portion of the care of this patient.  I personally made/approved the management plan for this patient and take responsibility for the patient management.  EKG Interpretation  Date/Time:  Wednesday Jan 19 2023 11:39:57 EDT Ventricular Rate:  52 PR Interval:  222 QRS Duration: 94 QT Interval:  489 QTC Calculation: 455 R Axis:   21 Text Interpretation: Sinus rhythm Prolonged PR interval Abnormal R-wave progression, early transition No significant change since last tracing Confirmed by Lorre Nick ([id number]) on 01/19/2023 1:64:105 PM   77 year old male presents with weakness that started yesterday continued till today.  Patient states he has been fatigued and weak with slightly foggy memory.  Denies any focal weakness.  Feels at his baseline at this time.  Workup here is negative.  Patient assessed today and feels well enough to go home.  Will follow-up with his doctor return precautions given   Lorre Nick, MD 01/19/23 1624

## 2023-01-19 NOTE — Discharge Instructions (Signed)
You have been seen today for your complaint of fatigue. Your lab work was overall very reassuring. Your imaging was reassuring and showed no abnormalities. Home care instructions are as follows:  Eat and drink normal diet with plenty of protein Follow up with: Your primary care provider within the next week if your symptoms do not improve Please seek immediate medical care if you develop any of the following symptoms: You feel confused, feel like you might faint, or faint. Your vision is blurry or you have a severe headache. You have severe pain in your abdomen, your back, or the area between your waist and hips (pelvis). You have chest pain, shortness of breath, or an irregular or fast heartbeat. You are unable to urinate, or you urinate less than normal. You have abnormal bleeding from the rectum, nose, lungs, nipples, or, if you are male, the vagina. You vomit blood. You have thoughts about hurting yourself or others. At this time there does not appear to be the presence of an emergent medical condition, however there is always the potential for conditions to change. Please read and follow the below instructions.  Do not take your medicine if  develop an itchy rash, swelling in your mouth or lips, or difficulty breathing; call 911 and seek immediate emergency medical attention if this occurs.  You may review your lab tests and imaging results in their entirety on your MyChart account.  Please discuss all results of fully with your primary care provider and other specialist at your follow-up visit.  Note: Portions of this text may have been transcribed using voice recognition software. Every effort was made to ensure accuracy; however, inadvertent computerized transcription errors may still be present.

## 2023-01-19 NOTE — ED Provider Notes (Signed)
[place] EMERGENCY DEPARTMENT AT [facility] Provider Note   CSN: [id number] Arrival date & time: 01/19/23  1126     History  Chief Complaint  Patient presents with   Fatigue   Weakness    Randy Duncan is a 77 y.o. male.  With history of aortic valve replacement, celiac disease, fatigue who presents to the ED for evaluation of generalized weakness.  He states his weakness began yesterday and has gotten worse.  Typically plays golf 3 days/week.  Attempted to play today but states he was too fatigued and had to stop after 2 holes.  He noticed some left arm numbness yesterday that has since resolved.  He is attributing this to some left shoulder pain that he had yesterday.  Also endorsing memory fog and slight shortness of breath.  States that shortness of breath is constant but worse with exertion, this started early this morning.  He does have some left-sided chest pressure.  He denies chest pain, abdominal pain, fevers, chills, back pain, cough.   Weakness      Home Medications Prior to Admission medications   Medication Sig Start Date End Date Taking? Authorizing Provider  aspirin 81 MG tablet Take 81 mg by mouth daily.    [provider]  clonazePAM (KLONOPIN) 0.5 MG tablet Take 0.5 mg by mouth 3 (three) times daily.  05/10/13   [provider]      Allergies    Keflex [cephalexin] and Wellbutrin [bupropion]    Review of Systems   Review of Systems  Constitutional:  Positive for fatigue.  Neurological:  Positive for weakness.  All other systems reviewed and are negative.   Physical Exam Updated Vital Signs BP (!) 140/56 (BP Location: Right Arm)   Pulse (!) 50   Temp 97.6 F (36.4 C) (Oral)   Resp 13   Ht 5\' 7"  (1.702 m)   Wt 73.2 kg   SpO2 100%   BMI 25.26 kg/m  Physical Exam Vitals and nursing note reviewed.  Constitutional:      General: He is not in acute distress.    Appearance: He is well-developed.     Comments: Resting  comfortably in bed  HENT:     Head: Normocephalic and atraumatic.  Eyes:     Conjunctiva/sclera: Conjunctivae normal.  Cardiovascular:     Rate and Rhythm: Normal rate and regular rhythm.     Pulses:          Radial pulses are 2+ on the right side and 2+ on the left side.     Heart sounds: No murmur heard. Pulmonary:     Effort: Pulmonary effort is normal. No respiratory distress.     Breath sounds: Normal breath sounds. No wheezing, rhonchi or rales.  Abdominal:     Palpations: Abdomen is soft.     Tenderness: There is no abdominal tenderness. There is no guarding.  Musculoskeletal:        General: No swelling.     Cervical back: Neck supple.     Right lower leg: No edema.     Left lower leg: No edema.  Skin:    General: Skin is warm and dry.     Capillary Refill: Capillary refill takes less than 2 seconds.  Neurological:     General: No focal deficit present.     Mental Status: He is alert and oriented to person, place, and time.  Psychiatric:        Mood and Affect: Mood  normal.        Behavior: Behavior normal.     ED Results / Procedures / Treatments   Labs (all labs ordered are listed, but only abnormal results are displayed) Labs Reviewed  BASIC METABOLIC PANEL - Abnormal; Notable for the following components:      Result Value   Glucose, Bld 113 (*)    All other components within normal limits  CBC - Abnormal; Notable for the following components:   RBC 3.73 (*)    Hemoglobin 12.7 (*)    HCT 36.9 (*)    Platelets 117 (*)    All other components within normal limits  URINALYSIS, ROUTINE W REFLEX MICROSCOPIC - Abnormal; Notable for the following components:   Color, Urine COLORLESS (*)    Specific Gravity, Urine 1.002 (*)    All other components within normal limits  BRAIN NATRIURETIC PEPTIDE - Abnormal; Notable for the following components:   B Natriuretic Peptide 229.2 (*)    All other components within normal limits  CBG MONITORING, ED  TROPONIN I (HIGH  SENSITIVITY)  TROPONIN I (HIGH SENSITIVITY)    EKG EKG Interpretation  Date/Time:  Wednesday Jan 19 2023 11:39:57 EDT Ventricular Rate:  52 PR Interval:  222 QRS Duration: 94 QT Interval:  489 QTC Calculation: 455 R Axis:   21 Text Interpretation: Sinus rhythm Prolonged PR interval Abnormal R-wave progression, early transition No significant change since last tracing Confirmed by Lorre Nick ([id number]) on 01/19/2023 1:27:43 PM  Radiology DG Chest 2 View  Result Date: 01/19/2023 CLINICAL DATA:  Shortness of breath EXAM: CHEST - 2 VIEW COMPARISON:  CXR 06/20/13 FINDINGS: Status post median sternotomy. No pleural effusion. No pneumothorax. Normal cardiac and mediastinal contours. No focal airspace opacity. No radiographically apparent displaced rib fractures. Visualized upper abdomen is unremarkable. Degenerative changes at the right [facility] joint. IMPRESSION: No focal airspace opacity. Electronically Signed   By: Lorenza Cambridge M.D.   On: 01/19/2023 14:28    Procedures Procedures    Medications Ordered in ED Medications  sodium chloride 0.9 % bolus 500 mL (500 mLs Intravenous New Bag/Given 01/19/23 1532)    ED Course/ Medical Decision Making/ A&P                             Medical Decision Making Amount and/or Complexity of Data Reviewed Labs: ordered. Radiology: ordered.  This patient presents to the ED for concern of fatigue, this involves an extensive number of treatment options, and is a complaint that carries with it a high risk of complications and morbidity. The differential diagnosis of weakness includes but is not limited to neurologic causes (GBS, myasthenia gravis, CVA, MS, ALS, transverse myelitis, spinal cord injury, CVA, botulism, ) and other causes: ACS, Arrhythmia, syncope, orthostatic hypotension, sepsis, hypoglycemia, electrolyte disturbance, hypothyroidism, respiratory failure, symptomatic anemia, dehydration, heat injury, polypharmacy, malignancy.    Co morbidities  that complicate the patient evaluation   aortic valve replacement, anxiety, depression, former smoker  My initial workup includes labs, imaging, EKG  Additional history obtained from: Nursing notes from this visit. Family wife is present and provides a portion of the history  I ordered, reviewed and interpreted labs which include: CBC, BMP, urinalysis, troponin, BNP.  Borderline anemia with hemoglobin of 12.7.  Thrombocytopenia of 117 which appears similar to previous labs on care everywhere.  Elevation of his BNP to 229 without evidence of fluid overload on exam or imaging.  I ordered imaging studies  including chest x-ray I independently visualized and interpreted imaging which showed normal I agree with the radiologist interpretation  Cardiac Monitoring:  The patient was maintained on a cardiac monitor.  I personally viewed and interpreted the cardiac monitored which showed an underlying rhythm of: Sinus bradycardia  Afebrile, hemodynamically stable.  77 year old male presenting to the ED for evaluation of generalized fatigue that began yesterday.  Got worse today.  States he did not eat much at all yesterday.  Has also not been drinking much water.  This is likely the cause of his symptoms.  He was complaining of some left arm numbness that he noticed after an episode of left shoulder pain.  EKG without ischemic changes, initial troponin and chest x-ray normal.  Do not believe this is cardiac in nature.  He appears otherwise very well on exam and is in no acute distress.  He was encouraged to follow-up with his primary care provider and primary cardiologist within the next week for reevaluation if his symptoms do not improve.  He was given strict return precautions.  Stable at discharge.  At this time there does not appear to be any evidence of an acute emergency medical condition and the patient appears stable for discharge with appropriate outpatient follow up. Diagnosis was discussed with  patient who verbalizes understanding of care plan and is agreeable to discharge. I have discussed return precautions with patient and wife who verbalizes understanding. Patient encouraged to follow-up with their PCP within 1 week. All questions answered.  Patient's case discussed with Dr. Freida Busman who agrees with plan to discharge with follow-up.   Note: Portions of this report may have been transcribed using voice recognition software. Every effort was made to ensure accuracy; however, inadvertent computerized transcription errors may still be present.        Final Clinical Impression(s) / ED Diagnoses Final diagnoses:  Other fatigue    Rx / DC Orders ED Discharge Orders     None         Mora Bellman 01/19/23 1626    Lorre Nick, MD 01/20/23 2317

## 2023-01-19 NOTE — ED Triage Notes (Signed)
Left hand/arm numbness and tingling and left shoulder pain that started yesterday. Some numbness in right fingers today. Fatigue, weakness, memory fog, slight shortness of breath all the time, worse with exertion.  Hx of aortic valve replacement.

## 2023-02-09 ENCOUNTER — Ambulatory Visit: Payer: Medicare HMO | Admitting: Internal Medicine

## 2023-02-09 ENCOUNTER — Encounter: Payer: Self-pay | Admitting: Internal Medicine

## 2023-02-09 VITALS — BP 160/76 | HR 62 | Temp 97.7°F | Ht 67.0 in | Wt 155.6 lb

## 2023-02-09 DIAGNOSIS — R0602 Shortness of breath: Secondary | ICD-10-CM

## 2023-02-09 DIAGNOSIS — Z122 Encounter for screening for malignant neoplasm of respiratory organs: Secondary | ICD-10-CM | POA: Diagnosis not present

## 2023-02-09 DIAGNOSIS — I10 Essential (primary) hypertension: Secondary | ICD-10-CM

## 2023-02-09 DIAGNOSIS — Z87891 Personal history of nicotine dependence: Secondary | ICD-10-CM | POA: Diagnosis not present

## 2023-02-09 NOTE — Patient Instructions (Signed)
Please schedule follow up scheduled with myself in 1 months.  If my schedule is not open yet, we will contact you with a reminder closer to that time. Please call [phone] if you haven't heard from [place] a month before.   Before your next visit I would like you to have:  Full set of PFTs, see me after.   Please measure your blood pressure and heart rate daily and call your primary care or your cardiologist to get your blood pressure under better control. I think this is most responsible for your symptoms.   We will order breathing testing to rule out lung disease.  You are next due for a CT scan for lung cancer screening in May 2025.

## 2023-02-09 NOTE — Progress Notes (Signed)
Randy Duncan    [id number]    Feb 10, 1946  Primary Care Physician:Bouska, Onalee Hua, MD  Referring Physician: Tracey Harries, MD [address] [place],  [place] [address] Reason for Consultation: shortness of breath, fatigue Date of Consultation: 02/09/2023  Chief complaint:   Chief Complaint  Patient presents with   Follow-up    Sob, and fatigue. pt had a stress test, and a cath. Last week and is still feeling sob. This all started May 8th      HPI: Randy Duncan is a 77 y.o. man who presents for new patient evaluation for shortness of breath and fatigue. He has a history of childhood asthma and tobacco use disorder. He has a history of CAD.   On  May 8th had sudden onset shortness of breath and fatigue. Went to [facility] ED and they ruled out MI. Saw his cardiologist at [facility]. He had a positive stress test May 17th and underwent LHC at [facility].   Results were: "Fatigue and weakness Hypertensive hemodynamics Elevated left ventricular end-diastolic filling pressure Nonobstructive coronary artery disease.  Moderate branch vessel disease of the ostial proximal first diagonal branch.  Mild nonobstructive disease in the right coronary artery. False positive Cardiolite stress test Status post aortic valve replacement with no significant hemodynamic Gradient  RECOMMENDATIONS: Continue with medical management and risk factor modification. Follow-up with Dr. Fransisco Beau for further evaluation and management."   Sudden onset sob and fatigue three weeks ago. He plays golf three days a week. He was unable to play golf after 2 holes. Whole body weakness. No cough or wheezing. Felt like he couldn't get a full breath in.  Symptoms are constant, all day, but do not bother him in his sleep. He has not been prescribed anything for this. His Blood pressure has been a little on the high side. SBP>150s. His amlodipine was increased from 5 to 10 and he had a bright red flushed rash on  his cheeks, face, neck. He is now doing ok on 5 mg. He also takes 12.5 mg of toprol XL.    Denies recurrent pneumonia or bronchitis.     Social history:  Occupation: retired, used to work as Tree surgeon.  Exposures: Smoking history: Has smoked on and off for 40 years. 1 ppd = 40 pack years. Quit June 2013  Social History   Occupational History    Employer: [employer]  Tobacco Use   Smoking status: Former    Packs/day: 1.00    Years: 40.00    Additional pack years: 0.00    Total pack years: 40.00    Types: Cigarettes   Smokeless tobacco: Never   Tobacco comments:    quit 02/2012  Vaping Use   Vaping Use: Never used  Substance and Sexual Activity   Alcohol use: No    Comment: quit in 2006   Drug use: No   Sexual activity: Not on file    Relevant family history:  Family History  Problem Relation Age of Onset   Emphysema Maternal Grandfather    Celiac disease Daughter    Lung disease Neg Hx     Past Medical History:  Diagnosis Date   Abdominal bloating    Celiac disease    Fatigue    Hypersomnia with sleep apnea, 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 Gen:      No acute distress ENT:  no nasal polyps, mucus membranes moist Lungs:    No increased respiratory effort, symmetric chest wall excursion, clear to auscultation bilaterally, no wheezes or crackles CV:         Regular rate and rhythm; no murmurs, rubs, or gallops.  No pedal edema Abd:      + bowel sounds; soft, non-tender; no distension MSK: no acute synovitis of  DIP or PIP joints, no mechanics hands.  Skin:      Warm and dry; no rashes Neuro: normal speech, no focal facial asymmetry Psych: alert and oriented x3, normal mood and affect   Data Reviewed/Medical Decision Making:  Independent interpretation of tests: Imaging:  Review of patient's chest xray May 2024 images revealed no acute pulmonary process. [name] images have been independently reviewed by me.    PFTs:    Labs:  Lab Results  Component Value Date   NA 138 01/19/2023   K 4.4 01/19/2023   CO2 25 01/19/2023   GLUCOSE 113 (H) 01/19/2023   BUN 16 01/19/2023   CREATININE 1.09 01/19/2023   CALCIUM 9.2 01/19/2023   GFRNONAA >60 01/19/2023   Lab Results  Component Value Date   WBC 6.6 01/19/2023   HGB 12.7 (L) 01/19/2023   HCT 36.9 (L) 01/19/2023   MCV 98.9 01/19/2023   PLT 117 (L) 01/19/2023      Immunization status:  Immunization History  Administered Date(s) Administered   Fluad Quad(high Dose 65+) 06/05/2020, 09/09/2022   PFIZER(Purple Top)SARS-COV-2 Vaccination 10/05/2019, 10/26/2019, 07/19/2020   PNEUMOCOCCAL CONJUGATE-20 09/09/2022   Pneumococcal Polysaccharide-23 05/02/2019   Tdap 07/24/2007     I reviewed prior external note(s) from pcp, cardiology  I reviewed the result(s) of the labs and imaging as noted above.   I have ordered pft   Assessment:  Shortness of breath History of Smoking CAD S/p AVR Hypertension, not well controlled   Plan/Recommendations: SBP was 160 and on repeat was 158. He reports white coat hypertension,.   Please measure your blood pressure and heart rate daily and call your primary care or your cardiologist to get your blood pressure under better control. I think this is most responsible for your symptoms.   Full set of PFTs, see me after.  We will order breathing testing to rule out lung disease.  You are next due for a CT scan for lung cancer screening in May 2025.    Return to Care: Return in about 4 weeks  (around 03/09/2023) for next available PFT, follow up after.  Durel Salts, MD Pulmonary and Critical Care Medicine [facility] Office:[phone]  CC: Tracey Harries, MD

## 2023-02-10 ENCOUNTER — Telehealth: Payer: Self-pay | Admitting: Internal Medicine

## 2023-02-10 NOTE — Telephone Encounter (Signed)
Pt calling in because he want to try to get a Pft sch before July 9th informed pt we dont have a Full Pft avail before the 9th

## 2023-02-12 ENCOUNTER — Encounter: Payer: Self-pay | Admitting: Internal Medicine

## 2023-02-14 NOTE — Telephone Encounter (Signed)
ATC X1 LVM for patient to call the office back 

## 2023-02-14 NOTE — Telephone Encounter (Signed)
Pft has already been rescheduled. Closing encounter.

## 2023-02-27 ENCOUNTER — Emergency Department (HOSPITAL_COMMUNITY): Payer: Medicare HMO

## 2023-02-27 ENCOUNTER — Encounter (HOSPITAL_COMMUNITY): Payer: Self-pay

## 2023-02-27 ENCOUNTER — Inpatient Hospital Stay (HOSPITAL_COMMUNITY)
Admission: EM | Admit: 2023-02-27 | Discharge: 2023-03-05 | DRG: 291 | Disposition: A | Discharge status: Home Health | Payer: Medicare HMO | Attending: Internal Medicine | Admitting: Internal Medicine

## 2023-02-27 ENCOUNTER — Other Ambulatory Visit: Payer: Self-pay

## 2023-02-27 DIAGNOSIS — I351 Nonrheumatic aortic (valve) insufficiency: Secondary | ICD-10-CM | POA: Insufficient documentation

## 2023-02-27 DIAGNOSIS — Z8379 Family history of other diseases of the digestive system: Secondary | ICD-10-CM | POA: Diagnosis not present

## 2023-02-27 DIAGNOSIS — Z79899 Other long term (current) drug therapy: Secondary | ICD-10-CM

## 2023-02-27 DIAGNOSIS — R072 Precordial pain: Secondary | ICD-10-CM | POA: Diagnosis not present

## 2023-02-27 DIAGNOSIS — Z91018 Allergy to other foods: Secondary | ICD-10-CM

## 2023-02-27 DIAGNOSIS — G4733 Obstructive sleep apnea (adult) (pediatric): Secondary | ICD-10-CM | POA: Diagnosis present

## 2023-02-27 DIAGNOSIS — I5043 Acute on chronic combined systolic (congestive) and diastolic (congestive) heart failure: Secondary | ICD-10-CM

## 2023-02-27 DIAGNOSIS — E441 Mild protein-calorie malnutrition: Secondary | ICD-10-CM | POA: Diagnosis present

## 2023-02-27 DIAGNOSIS — R17 Unspecified jaundice: Secondary | ICD-10-CM | POA: Diagnosis present

## 2023-02-27 DIAGNOSIS — Z825 Family history of asthma and other chronic lower respiratory diseases: Secondary | ICD-10-CM

## 2023-02-27 DIAGNOSIS — E119 Type 2 diabetes mellitus without complications: Secondary | ICD-10-CM | POA: Diagnosis present

## 2023-02-27 DIAGNOSIS — Z7982 Long term (current) use of aspirin: Secondary | ICD-10-CM | POA: Diagnosis not present

## 2023-02-27 DIAGNOSIS — Z87891 Personal history of nicotine dependence: Secondary | ICD-10-CM

## 2023-02-27 DIAGNOSIS — I2489 Other forms of acute ischemic heart disease: Secondary | ICD-10-CM | POA: Diagnosis present

## 2023-02-27 DIAGNOSIS — E785 Hyperlipidemia, unspecified: Secondary | ICD-10-CM | POA: Diagnosis present

## 2023-02-27 DIAGNOSIS — R7989 Other specified abnormal findings of blood chemistry: Secondary | ICD-10-CM | POA: Diagnosis present

## 2023-02-27 DIAGNOSIS — K900 Celiac disease: Secondary | ICD-10-CM | POA: Diagnosis present

## 2023-02-27 DIAGNOSIS — Z1152 Encounter for screening for COVID-19: Secondary | ICD-10-CM

## 2023-02-27 DIAGNOSIS — I509 Heart failure, unspecified: Secondary | ICD-10-CM | POA: Diagnosis not present

## 2023-02-27 DIAGNOSIS — E871 Hypo-osmolality and hyponatremia: Secondary | ICD-10-CM | POA: Diagnosis not present

## 2023-02-27 DIAGNOSIS — I420 Dilated cardiomyopathy: Secondary | ICD-10-CM | POA: Diagnosis present

## 2023-02-27 DIAGNOSIS — Z953 Presence of xenogenic heart valve: Secondary | ICD-10-CM | POA: Diagnosis not present

## 2023-02-27 DIAGNOSIS — G4710 Hypersomnia, unspecified: Secondary | ICD-10-CM | POA: Diagnosis present

## 2023-02-27 DIAGNOSIS — Z881 Allergy status to other antibiotic agents status: Secondary | ICD-10-CM

## 2023-02-27 DIAGNOSIS — J811 Chronic pulmonary edema: Secondary | ICD-10-CM | POA: Diagnosis present

## 2023-02-27 DIAGNOSIS — J432 Centrilobular emphysema: Secondary | ICD-10-CM | POA: Diagnosis not present

## 2023-02-27 DIAGNOSIS — N179 Acute kidney failure, unspecified: Secondary | ICD-10-CM | POA: Diagnosis present

## 2023-02-27 DIAGNOSIS — D649 Anemia, unspecified: Secondary | ICD-10-CM | POA: Diagnosis present

## 2023-02-27 DIAGNOSIS — Z952 Presence of prosthetic heart valve: Secondary | ICD-10-CM | POA: Diagnosis not present

## 2023-02-27 DIAGNOSIS — I10 Essential (primary) hypertension: Secondary | ICD-10-CM | POA: Diagnosis not present

## 2023-02-27 DIAGNOSIS — I110 Hypertensive heart disease with heart failure: Secondary | ICD-10-CM | POA: Diagnosis not present

## 2023-02-27 DIAGNOSIS — E8779 Other fluid overload: Principal | ICD-10-CM

## 2023-02-27 DIAGNOSIS — L89151 Pressure ulcer of sacral region, stage 1: Secondary | ICD-10-CM | POA: Diagnosis present

## 2023-02-27 DIAGNOSIS — D696 Thrombocytopenia, unspecified: Secondary | ICD-10-CM | POA: Diagnosis present

## 2023-02-27 DIAGNOSIS — Z886 Allergy status to analgesic agent status: Secondary | ICD-10-CM

## 2023-02-27 DIAGNOSIS — I2510 Atherosclerotic heart disease of native coronary artery without angina pectoris: Secondary | ICD-10-CM | POA: Diagnosis present

## 2023-02-27 DIAGNOSIS — J189 Pneumonia, unspecified organism: Secondary | ICD-10-CM | POA: Diagnosis present

## 2023-02-27 DIAGNOSIS — J810 Acute pulmonary edema: Secondary | ICD-10-CM | POA: Diagnosis not present

## 2023-02-27 DIAGNOSIS — J9601 Acute respiratory failure with hypoxia: Secondary | ICD-10-CM | POA: Diagnosis present

## 2023-02-27 DIAGNOSIS — I350 Nonrheumatic aortic (valve) stenosis: Secondary | ICD-10-CM | POA: Diagnosis not present

## 2023-02-27 DIAGNOSIS — L8990 Pressure ulcer of unspecified site, unspecified stage: Secondary | ICD-10-CM | POA: Diagnosis present

## 2023-02-27 DIAGNOSIS — Z888 Allergy status to other drugs, medicaments and biological substances status: Secondary | ICD-10-CM

## 2023-02-27 HISTORY — DX: Presence of xenogenic heart valve: Z95.3

## 2023-02-27 LAB — TROPONIN I (HIGH SENSITIVITY)
Troponin I (High Sensitivity): 45 ng/L — ABNORMAL HIGH (ref <18)
Troponin I (High Sensitivity): 50 ng/L — ABNORMAL HIGH (ref <18)
Troponin I (High Sensitivity): 101 ng/L (ref <18)
Troponin I (High Sensitivity): 91 ng/L — ABNORMAL HIGH (ref <18)

## 2023-02-27 LAB — CBC WITH DIFFERENTIAL/PLATELET
Abs Immature Granulocytes: 0.2 10*3/uL — ABNORMAL HIGH (ref 0.00–0.07)
Basophils Absolute: 0 10*3/uL (ref 0.0–0.1)
Basophils Relative: 0 %
Eosinophils Absolute: 0 10*3/uL (ref 0.0–0.5)
Eosinophils Relative: 0 %
HCT: 32 % — ABNORMAL LOW (ref 39.0–52.0)
Hemoglobin: 11.2 g/dL — ABNORMAL LOW (ref 13.0–17.0)
Immature Granulocytes: 2 %
Lymphocytes Relative: 7 %
Lymphs Abs: 0.8 10*3/uL (ref 0.7–4.0)
MCH: 33.9 pg (ref 26.0–34.0)
MCHC: 35 g/dL (ref 30.0–36.0)
MCV: 97 fL (ref 80.0–100.0)
Monocytes Absolute: 1.2 10*3/uL — ABNORMAL HIGH (ref 0.1–1.0)
Monocytes Relative: 10 %
Neutro Abs: 9.4 10*3/uL — ABNORMAL HIGH (ref 1.7–7.7)
Neutrophils Relative %: 81 %
Platelets: 134 10*3/uL — ABNORMAL LOW (ref 150–400)
RBC: 3.3 MIL/uL — ABNORMAL LOW (ref 4.22–5.81)
RDW: 11.8 % (ref 11.5–15.5)
WBC: 11.7 10*3/uL — ABNORMAL HIGH (ref 4.0–10.5)
nRBC: 0 % (ref 0.0–0.2)

## 2023-02-27 LAB — COMPREHENSIVE METABOLIC PANEL
ALT: 39 U/L (ref 0–44)
AST: 28 U/L (ref 15–41)
Albumin: 3.2 g/dL — ABNORMAL LOW (ref 3.5–5.0)
Alkaline Phosphatase: 59 U/L (ref 38–126)
Anion gap: 10 (ref 5–15)
BUN: 15 mg/dL (ref 8–23)
CO2: 20 mmol/L — ABNORMAL LOW (ref 22–32)
Calcium: 8.4 mg/dL — ABNORMAL LOW (ref 8.9–10.3)
Chloride: 98 mmol/L (ref 98–111)
Creatinine, Ser: 0.83 mg/dL (ref 0.61–1.24)
GFR, Estimated: >60 mL/min (ref >60)
Glucose, Bld: 138 mg/dL — ABNORMAL HIGH (ref 70–99)
Potassium: 4 mmol/L (ref 3.5–5.1)
Sodium: 128 mmol/L — ABNORMAL LOW (ref 135–145)
Total Bilirubin: 1.4 mg/dL — ABNORMAL HIGH (ref 0.3–1.2)
Total Protein: 6.2 g/dL — ABNORMAL LOW (ref 6.5–8.1)

## 2023-02-27 LAB — RESP PANEL BY RT-PCR (RSV, FLU A&B, COVID)  RVPGX2
Influenza A by PCR: NEGATIVE
Influenza B by PCR: NEGATIVE
Resp Syncytial Virus by PCR: NEGATIVE
SARS Coronavirus 2 by RT PCR: NEGATIVE

## 2023-02-27 LAB — URINALYSIS, W/ REFLEX TO CULTURE (INFECTION SUSPECTED)
Bacteria, UA: NONE SEEN
Bilirubin Urine: NEGATIVE
Glucose, UA: NEGATIVE mg/dL
Hgb urine dipstick: NEGATIVE
Ketones, ur: 5 mg/dL — AB
Leukocytes,Ua: NEGATIVE
Nitrite: NEGATIVE
Protein, ur: NEGATIVE mg/dL
Specific Gravity, Urine: 1.013 (ref 1.005–1.030)
pH: 5 (ref 5.0–8.0)

## 2023-02-27 LAB — MAGNESIUM: Magnesium: 1.8 mg/dL (ref 1.7–2.4)

## 2023-02-27 LAB — BRAIN NATRIURETIC PEPTIDE: B Natriuretic Peptide: 744.7 pg/mL — ABNORMAL HIGH (ref 0.0–100.0)

## 2023-02-27 LAB — LACTIC ACID, PLASMA
Lactic Acid, Venous: 0.9 mmol/L (ref 0.5–1.9)
Lactic Acid, Venous: 0.8 mmol/L (ref 0.5–1.9)

## 2023-02-27 MED ORDER — VANCOMYCIN HCL 1500 MG/300ML IV SOLN
1500.0000 mg | INTRAVENOUS | Status: AC
  Administered 2023-02-27: 1500 mg via INTRAVENOUS
  Filled 2023-02-27: qty 300

## 2023-02-27 MED ORDER — CLONAZEPAM 0.25 MG PO TBDP
0.2500 mg | ORAL_TABLET | ORAL | Status: DC | PRN
  Administered 2023-02-27 – 2023-03-05 (×19): 0.25 mg via ORAL
  Filled 2023-02-27: qty 1
  Filled 2023-02-27: qty 2
  Filled 2023-02-27: qty 1
  Filled 2023-02-27 (×2): qty 2
  Filled 2023-02-27 (×4): qty 1
  Filled 2023-02-27: qty 2
  Filled 2023-02-27 (×3): qty 1
  Filled 2023-02-27 (×6): qty 2

## 2023-02-27 MED ORDER — SODIUM CHLORIDE 0.9 % IV BOLUS
1000.0000 mL | Freq: Once | INTRAVENOUS | Status: AC
  Administered 2023-02-27: 1000 mL via INTRAVENOUS

## 2023-02-27 MED ORDER — IOHEXOL 350 MG/ML SOLN
100.0000 mL | Freq: Once | INTRAVENOUS | Status: AC | PRN
  Administered 2023-02-27: 75 mL via INTRAVENOUS

## 2023-02-27 MED ORDER — HYDRALAZINE HCL 25 MG PO TABS
25.0000 mg | ORAL_TABLET | Freq: Two times a day (BID) | ORAL | Status: DC
  Administered 2023-02-27 – 2023-03-05 (×6): 25 mg via ORAL
  Filled 2023-02-27 (×10): qty 1

## 2023-02-27 MED ORDER — SODIUM CHLORIDE (PF) 0.9 % IJ SOLN
INTRAMUSCULAR | Status: AC
  Filled 2023-02-27: qty 50

## 2023-02-27 MED ORDER — NITROGLYCERIN 0.4 MG SL SUBL
0.4000 mg | SUBLINGUAL_TABLET | Freq: Once | SUBLINGUAL | Status: AC
  Administered 2023-02-27: 0.4 mg via SUBLINGUAL
  Filled 2023-02-27: qty 1

## 2023-02-27 MED ORDER — ASPIRIN 81 MG PO TBEC
81.0000 mg | DELAYED_RELEASE_TABLET | Freq: Every day | ORAL | Status: DC
  Administered 2023-02-27 – 2023-03-05 (×6): 81 mg via ORAL
  Filled 2023-02-27 (×7): qty 1

## 2023-02-27 MED ORDER — SODIUM CHLORIDE 0.9 % IV SOLN
2.0000 g | INTRAVENOUS | Status: AC
  Administered 2023-02-27: 2 g via INTRAVENOUS
  Filled 2023-02-27: qty 12.5

## 2023-02-27 MED ORDER — ONDANSETRON HCL 4 MG/2ML IJ SOLN: 4.0000 mg | Freq: Four times a day (QID) | INTRAMUSCULAR | Status: DC | PRN

## 2023-02-27 MED ORDER — RANOLAZINE ER 500 MG PO TB12
1000.0000 mg | ORAL_TABLET | Freq: Two times a day (BID) | ORAL | Status: DC
  Administered 2023-02-27 – 2023-03-05 (×11): 1000 mg via ORAL
  Filled 2023-02-27 (×13): qty 2

## 2023-02-27 MED ORDER — NITROGLYCERIN 0.4 MG/SPRAY TL SOLN: 1.0000 | Status: DC | PRN

## 2023-02-27 MED ORDER — ALBUTEROL SULFATE (2.5 MG/3ML) 0.083% IN NEBU
2.5000 mg | INHALATION_SOLUTION | Freq: Three times a day (TID) | RESPIRATORY_TRACT | Status: DC
  Administered 2023-02-27 – 2023-03-03 (×11): 2.5 mg via RESPIRATORY_TRACT
  Filled 2023-02-27 (×12): qty 3

## 2023-02-27 MED ORDER — ACETAMINOPHEN 650 MG RE SUPP: 650.0000 mg | Freq: Four times a day (QID) | RECTAL | Status: DC | PRN

## 2023-02-27 MED ORDER — SODIUM CHLORIDE 0.9 % IV SOLN
1.0000 g | Freq: Every day | INTRAVENOUS | Status: DC
  Administered 2023-02-27: 1 g via INTRAVENOUS
  Filled 2023-02-27 (×2): qty 10

## 2023-02-27 MED ORDER — ONDANSETRON HCL 4 MG PO TABS: 4.0000 mg | ORAL_TABLET | Freq: Four times a day (QID) | ORAL | Status: DC | PRN

## 2023-02-27 MED ORDER — ALBUTEROL SULFATE HFA 108 (90 BASE) MCG/ACT IN AERS: 2.0000 | INHALATION_SPRAY | RESPIRATORY_TRACT | Status: DC | PRN

## 2023-02-27 MED ORDER — FUROSEMIDE 10 MG/ML IJ SOLN
40.0000 mg | Freq: Once | INTRAMUSCULAR | Status: AC
  Administered 2023-02-27: 40 mg via INTRAVENOUS
  Filled 2023-02-27: qty 4

## 2023-02-27 MED ORDER — POTASSIUM CHLORIDE CRYS ER 20 MEQ PO TBCR
40.0000 meq | EXTENDED_RELEASE_TABLET | Freq: Once | ORAL | Status: AC
  Administered 2023-02-27: 40 meq via ORAL
  Filled 2023-02-27: qty 2

## 2023-02-27 MED ORDER — SODIUM CHLORIDE 0.9 % IV SOLN
500.0000 mg | Freq: Every day | INTRAVENOUS | Status: DC
  Administered 2023-02-27: 500 mg via INTRAVENOUS
  Filled 2023-02-27: qty 5

## 2023-02-27 MED ORDER — CLONAZEPAM 0.5 MG PO TABS: 0.5000 mg | ORAL_TABLET | Freq: Four times a day (QID) | ORAL | Status: DC | PRN

## 2023-02-27 MED ORDER — LABETALOL HCL 5 MG/ML IV SOLN
5.0000 mg | Freq: Once | INTRAVENOUS | Status: AC
  Administered 2023-02-27: 5 mg via INTRAVENOUS
  Filled 2023-02-27: qty 4

## 2023-02-27 MED ORDER — CHLORHEXIDINE GLUCONATE CLOTH 2 % EX PADS
6.0000 | MEDICATED_PAD | Freq: Every day | CUTANEOUS | Status: DC
  Administered 2023-02-28 – 2023-03-05 (×4): 6 via TOPICAL

## 2023-02-27 MED ORDER — ACETAMINOPHEN 325 MG PO TABS
650.0000 mg | ORAL_TABLET | Freq: Four times a day (QID) | ORAL | Status: DC | PRN
  Administered 2023-03-05: 650 mg via ORAL
  Filled 2023-02-27: qty 2

## 2023-02-27 MED ORDER — ALBUTEROL SULFATE (2.5 MG/3ML) 0.083% IN NEBU: 2.5000 mg | INHALATION_SOLUTION | Freq: Four times a day (QID) | RESPIRATORY_TRACT | Status: DC

## 2023-02-27 MED ORDER — SODIUM CHLORIDE 0.9 % IV SOLN: INTRAVENOUS | Status: DC | PRN

## 2023-02-27 MED ORDER — ATORVASTATIN CALCIUM 80 MG PO TABS
80.0000 mg | ORAL_TABLET | Freq: Every day | ORAL | Status: DC
  Administered 2023-02-27 – 2023-03-04 (×6): 80 mg via ORAL
  Filled 2023-02-27 (×2): qty 2
  Filled 2023-02-27 (×3): qty 1
  Filled 2023-02-27: qty 2

## 2023-02-27 MED ORDER — METOPROLOL SUCCINATE ER 25 MG PO TB24
12.5000 mg | ORAL_TABLET | Freq: Every day | ORAL | Status: DC
  Administered 2023-02-27 – 2023-03-04 (×5): 12.5 mg via ORAL
  Filled 2023-02-27 (×7): qty 1

## 2023-02-27 MED ORDER — ORAL CARE MOUTH RINSE: 15.0000 mL | OROMUCOSAL | Status: DC | PRN

## 2023-02-27 MED ORDER — CHLORHEXIDINE GLUCONATE CLOTH 2 % EX PADS: 6.0000 | MEDICATED_PAD | Freq: Every day | CUTANEOUS | Status: DC

## 2023-02-27 MED ORDER — PANTOPRAZOLE SODIUM 40 MG PO TBEC
40.0000 mg | DELAYED_RELEASE_TABLET | Freq: Every day | ORAL | Status: DC
  Administered 2023-02-27 – 2023-03-05 (×7): 40 mg via ORAL
  Filled 2023-02-27 (×7): qty 1

## 2023-02-27 NOTE — Plan of Care (Signed)
Plan of care and goals discussed with patient and family, time given for questions, both have good understanding of POC for tonight.  Problem: Education: Goal: Knowledge of disease or condition will improve Outcome: Progressing Goal: Knowledge of the prescribed therapeutic regimen will improve Outcome: Progressing Goal: Individualized Educational Video(s) Outcome: Progressing   Problem: Activity: Goal: Ability to tolerate increased activity will improve Outcome: Progressing Goal: Will verbalize the importance of balancing activity with adequate rest periods Outcome: Progressing   Problem: Respiratory: Goal: Ability to maintain a clear airway will improve Outcome: Progressing Goal: Levels of oxygenation will improve Outcome: Progressing Goal: Ability to maintain adequate ventilation will improve Outcome: Progressing   Problem: Activity: Goal: Ability to tolerate increased activity will improve Outcome: Progressing   Problem: Clinical Measurements: Goal: Ability to maintain a body temperature in the normal range will improve Outcome: Progressing   Problem: Respiratory: Goal: Ability to maintain adequate ventilation will improve Outcome: Progressing Goal: Ability to maintain a clear airway will improve Outcome: Progressing   Problem: Education: Goal: Knowledge of General Education information will improve Description: Including pain rating scale, medication(s)/side effects and non-pharmacologic comfort measures Outcome: Progressing   Problem: Health Behavior/Discharge Planning: Goal: Ability to manage health-related needs will improve Outcome: Progressing   Problem: Clinical Measurements: Goal: Ability to maintain clinical measurements within normal limits will improve Outcome: Progressing Goal: Will remain free from infection Outcome: Progressing Goal: Diagnostic test results will improve Outcome: Progressing Goal: Respiratory complications will improve Outcome:  Progressing Goal: Cardiovascular complication will be avoided Outcome: Progressing   Problem: Activity: Goal: Risk for activity intolerance will decrease Outcome: Progressing   Problem: Nutrition: Goal: Adequate nutrition will be maintained Outcome: Progressing   Problem: Coping: Goal: Level of anxiety will decrease Outcome: Progressing   Problem: Elimination: Goal: Will not experience complications related to bowel motility Outcome: Progressing Goal: Will not experience complications related to urinary retention Outcome: Progressing   Problem: Pain Managment: Goal: General experience of comfort will improve Outcome: Progressing   Problem: Safety: Goal: Ability to remain free from injury will improve Outcome: Progressing   Problem: Skin Integrity: Goal: Risk for impaired skin integrity will decrease Outcome: Progressing

## 2023-02-27 NOTE — Progress Notes (Signed)
   02/27/23 2012  BiPAP/CPAP/SIPAP  BiPAP/CPAP/SIPAP Pt Type Adult  BiPAP/CPAP/SIPAP V60  Mask Type Full face mask  Mask Size Large  Set Rate 12 breaths/min  Respiratory Rate 20 breaths/min  IPAP 10 cmH20  EPAP 5 cmH2O  FiO2 (%) 28 %  Minute Ventilation 13  Leak 14  Peak Inspiratory Pressure (PIP) 9  Tidal Volume (Vt) 788  Patient Home Equipment No  Auto Titrate No  Press High Alarm 35 cmH2O  Press Low Alarm 5 cmH2O  CPAP/SIPAP surface wiped down Yes  Oxygen Percent 28 %  BiPAP/CPAP /SiPAP Vitals  Pulse Rate 82  Resp (!) 21  BP (!) 141/45  SpO2 100 %  Bilateral Breath Sounds Diminished  MEWS Score/Color  MEWS Score 1  MEWS Score Color Green

## 2023-02-27 NOTE — Progress Notes (Signed)
A consult was received from an ED physician for Vancomycin and Cefepime per pharmacy dosing.  [name] profile has been reviewed for ht/wt/allergies/indication/available labs.    A one time order has been placed for Vancomycin 1500mg  IV and Cefepime 2g IV.  Further antibiotics/pharmacy consults should be ordered by admitting physician if indicated.                       Thank you, Jamse Mead 02/27/2023  12:50 PM

## 2023-02-27 NOTE — ED Provider Notes (Cosign Needed Addendum)
[place] EMERGENCY DEPARTMENT AT [facility] Provider Note   CSN: [id number] Arrival date & time: 02/27/23  0845     History  Chief Complaint  Patient presents with   Shortness of Breath   Cough    Randy Duncan is a 77 y.o. male with medical history of abdominal bloating, celiac disease, fatigue, hypoglycemia, impaired fasting glucose, sleep disorder.  Patient presents to ED for evaluation of shortness of breath, cough, chest pain, polyuria and polydipsia.  The patient reports that ever since May 8 he has had increased chest tightness, shortness of breath, lightheadedness and dizziness.  The patient reports that he was playing golf on May 8 when he had sudden onset of the symptoms.  The patient has been seen by numerous specialists including pulmonology and cardiology regarding this event on May 8.  He states that ever since May 8 he has had the symptoms persistently.  The patient reports that last night he became febrile and developed a cough.  He is certain there is an "infection in my lungs".  Patient reports that his fever reached 101 Fahrenheit.  The patient is not febrile here on arrival.  The patient goes on to state that he has had chest tightness, lightheadedness, dizziness and associated shortness of breath with exertion since May 8.  The patient reports that the only new feature to his symptoms today is a cough that began last night associated with a fever that he recorded with an oral thermometer measuring 101F oral. The patient reports that he feels dehydrated.  The patient is also complaining of polyuria and polydipsia and states that he has had 2 years of these symptoms, on chart review it appears the patient was recently referred to endocrinology.  Patient states that he did a 24-hour urine test on himself where he collected 123 mL of urine and is concerned about diabetes.  The patient has undergone numerous evaluations and procedures since May 8 to try and determine  underlying cause of symptoms.  Patient had a CT angiogram assessing for PEs on 5/15 which was negative.  He had a pharmacological nuclear stress test on 5/17 which was borderline for demonstration of inducible ischemia in the inferior region.  The patient subsequently had a cardiac catheterization on 5/24 with Dr. Ivan Croft which showed a 55% proximal diagonal lesion, 30% mid RCA lesion and otherwise no significant obstruction.  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  The first diagonal lesion was thought to be 90% obstructed and as per prior cardiac catheterization in 2020 has now reduced to 50 to 60%. His most recent ECHO on 11/11/22 shows EF of 65% to 67% in the LV. The patient reports he feels as if he is dehydrated. The patient is currently wearing a Holter monitor.   Shortness of Breath Associated symptoms: chest pain, cough and fever   Cough Associated symptoms: chest pain, fever and shortness of breath        Home Medications Prior to Admission medications   Medication Sig Start Date End Date Taking? Authorizing Provider  aspirin 81 MG tablet Take 81 mg by mouth daily.   Yes [provider]  atorvastatin (LIPITOR) 80 MG tablet Take 80 mg by mouth at bedtime.   Yes [provider]  cholecalciferol (VITAMIN D3) 25 MCG (1000 UNIT) tablet Take 1,000 Units by mouth daily.   Yes [provider]  clonazePAM (KLONOPIN) 0.5 MG tablet Take 0.25 mg by mouth every  4 (four) hours. 05/10/13  Yes [provider]  hydrALAZINE (APRESOLINE) 25 MG tablet Take 25 mg by mouth 2 (two) times daily. 02/17/23  Yes [provider]  metoprolol succinate (TOPROL-XL) 25 MG 24 hr tablet Take 12.5 mg by mouth daily.   Yes [provider]  ranolazine (RANEXA) 1000 MG SR tablet Take 1,000 mg by mouth 2 (two) times daily.   Yes [provider]  amLODipine (NORVASC) 10 MG tablet Take 10 mg by mouth  daily. Patient not taking: Reported on 02/27/2023 01/24/23   [provider]      Allergies    Losartan potassium, Naproxen, Wheat, Keflex [cephalexin], and Wellbutrin [bupropion]    Review of Systems   Review of Systems  Constitutional:  Positive for fever.  Respiratory:  Positive for cough and shortness of breath.   Cardiovascular:  Positive for chest pain.  All other systems reviewed and are negative.   Physical Exam Updated Vital Signs BP (!) 130/43   Pulse 76   Temp 97.8 F (36.6 C) (Oral)   Resp (!) 30   Ht 5\' 7"  (1.702 m)   Wt 71.2 kg   SpO2 98%   BMI 24.59 kg/m  Physical Exam Vitals and nursing note reviewed.  Constitutional:      General: He is not in acute distress.    Appearance: Normal appearance. He is not ill-appearing, toxic-appearing or diaphoretic.  HENT:     Head: Normocephalic and atraumatic.     Nose: Nose normal.     Mouth/Throat:     Mouth: Mucous membranes are moist.     Pharynx: Oropharynx is clear.  Eyes:     Extraocular Movements: Extraocular movements intact.     Conjunctiva/sclera: Conjunctivae normal.     Pupils: Pupils are equal, round, and reactive to light.  Cardiovascular:     Rate and Rhythm: Normal rate and regular rhythm.  Pulmonary:     Effort: Pulmonary effort is normal.     Breath sounds: Normal breath sounds. No wheezing.  Abdominal:     General: Abdomen is flat. Bowel sounds are normal.     Palpations: Abdomen is soft.     Tenderness: There is no abdominal tenderness.  Musculoskeletal:     Cervical back: Normal range of motion and neck supple. No tenderness.     Right lower leg: No edema.     Left lower leg: No edema.  Skin:    General: Skin is warm and dry.     Capillary Refill: Capillary refill takes less than 2 seconds.  Neurological:     Mental Status: He is alert and oriented to person, place, and time.     ED Results / Procedures / Treatments   Labs (all labs ordered are listed, but only abnormal  results are displayed) Labs Reviewed  CBC WITH DIFFERENTIAL/PLATELET - Abnormal; Notable for the following components:      Result Value   WBC 11.7 (*)    RBC 3.30 (*)    Hemoglobin 11.2 (*)    HCT 32.0 (*)    Platelets 134 (*)    Neutro Abs 9.4 (*)    Monocytes Absolute 1.2 (*)    Abs Immature Granulocytes 0.20 (*)    All other components within normal limits  COMPREHENSIVE METABOLIC PANEL - Abnormal; Notable for the following components:   Sodium 128 (*)    CO2 20 (*)    Glucose, Bld 138 (*)    Calcium 8.4 (*)  Total Protein 6.2 (*)    Albumin 3.2 (*)    Total Bilirubin 1.4 (*)    All other components within normal limits  BRAIN NATRIURETIC PEPTIDE - Abnormal; Notable for the following components:   B Natriuretic Peptide 744.7 (*)    All other components within normal limits  URINALYSIS, W/ REFLEX TO CULTURE (INFECTION SUSPECTED) - Abnormal; Notable for the following components:   Color, Urine COLORLESS (*)    Ketones, ur 5 (*)    All other components within normal limits  TROPONIN I (HIGH SENSITIVITY) - Abnormal; Notable for the following components:   Troponin I (High Sensitivity) 45 (*)    All other components within normal limits  TROPONIN I (HIGH SENSITIVITY) - Abnormal; Notable for the following components:   Troponin I (High Sensitivity) 50 (*)    All other components within normal limits  RESP PANEL BY RT-PCR (RSV, FLU A&B, COVID)  RVPGX2  CULTURE, BLOOD (ROUTINE X 2)  CULTURE, BLOOD (ROUTINE X 2)  RESPIRATORY PANEL BY PCR  LACTIC ACID, PLASMA  LACTIC ACID, PLASMA    EKG EKG Interpretation  Date/Time:  Sunday February 27 2023 08:56:12 EDT Ventricular Rate:  82 PR Interval:  202 QRS Duration: 91 QT Interval:  402 QTC Calculation: 470 R Axis:   24 Text Interpretation: Sinus rhythm Anteroseptal infarct, old Borderline ST depression, lateral leads ST elevation, consider inferior injury No significant change since last tracing Confirmed by Alvira Monday  ([id number]) on 02/27/2023 9:23:35 AM  Radiology CT Angio Chest PE W and/or Wo Contrast  Result Date: 02/27/2023 CLINICAL DATA:  Pulmonary embolism (PE) suspected, high prob EXAM: CT ANGIOGRAPHY CHEST WITH CONTRAST TECHNIQUE: Multidetector CT imaging of the chest was performed using the standard protocol during bolus administration of intravenous contrast. Multiplanar CT image reconstructions and MIPs were obtained to evaluate the vascular anatomy. RADIATION DOSE REDUCTION: This exam was performed according to the departmental dose-optimization program which includes automated exposure control, adjustment of the mA and/or kV according to patient size and/or use of iterative reconstruction technique. CONTRAST:  75mL OMNIPAQUE IOHEXOL 350 MG/ML SOLN COMPARISON:  X-ray 02/27/2023 FINDINGS: Cardiovascular: Satisfactory opacification of the pulmonary arteries to the segmental level. No evidence of pulmonary embolism. Thoracic aorta is nonaneurysmal. Aortic valve replacement. Scattered atherosclerotic vascular calcifications of the aorta and coronary arteries. Normal heart size. No pericardial effusion. Mediastinum/Nodes: Mildly prominent mediastinal lymph nodes including 1.1 cm precarinal node (series 4, image 56) and 1.3 cm AP window node (series 4, image 50). Suspect mildly prominent bilateral hilar lymph nodes, evaluation of which is limited in the presence of dense perihilar opacities. No axillary lymphadenopathy. Thyroid gland, trachea, and esophagus demonstrate no significant abnormality. Lungs/Pleura: Moderate layering bilateral pleural effusions with compressive atelectasis of the bilateral lower lobes. Dense airspace consolidations within the bilateral perihilar regions and within the right upper lobe. Surrounding ground-glass opacity. Diffuse interlobular septal thickening. No pneumothorax. Upper Abdomen: No acute abnormality. Musculoskeletal: Prior sternotomy. No acute bony abnormality. No chest wall  abnormality. Review of the MIP images confirms the above findings. IMPRESSION: 1. No evidence of pulmonary embolism. 2. Dense airspace consolidations within the bilateral perihilar regions and within the right upper lobe with surrounding ground-glass opacity. Findings are favored to represent pulmonary edema. Superimposed pneumonia would be difficult to exclude. 3. Moderate layering bilateral pleural effusions with compressive atelectasis of the bilateral lower lobes. 4. Mildly prominent mediastinal and hilar lymph nodes, likely reactive. 5. Aortic and coronary artery atherosclerosis (ICD10-I70.0). Electronically Signed   By: Duanne Guess  D.O.   On: 02/27/2023 12:49   DG Chest 2 View  Result Date: 02/27/2023 CLINICAL DATA:  Shortness of breath and cough for 1 day. EXAM: CHEST - 2 VIEW COMPARISON:  01/19/2023 FINDINGS: Heart size remains within normal limits. Prior median sternotomy and aortic valve replacement noted. Bilateral central pulmonary airspace opacity is seen in a bat wing configuration which is new and suspicious for acute pulmonary edema, with infection considered less likely. Tiny right pleural effusion also noted. IMPRESSION: New bilateral central pulmonary airspace opacity in bat wing configuration, suspicious for acute pulmonary edema, with infection considered less likely. Tiny right pleural effusion. Electronically Signed   By: Danae Orleans M.D.   On: 02/27/2023 09:32    Procedures Procedures   Medications Ordered in ED Medications  vancomycin (VANCOREADY) IVPB 1500 mg/300 mL (1,500 mg Intravenous New Bag/Given 02/27/23 1332)  sodium chloride 0.9 % bolus 1,000 mL (0 mLs Intravenous Stopped 02/27/23 1202)  furosemide (LASIX) injection 40 mg (40 mg Intravenous Given 02/27/23 1202)  iohexol (OMNIPAQUE) 350 MG/ML injection 100 mL (75 mLs Intravenous Contrast Given 02/27/23 1213)  nitroGLYCERIN (NITROSTAT) SL tablet 0.4 mg (0.4 mg Sublingual Given 02/27/23 1228)  labetalol (NORMODYNE)  injection 5 mg (5 mg Intravenous Given 02/27/23 1229)  ceFEPIme (MAXIPIME) 2 g in sodium chloride 0.9 % 100 mL IVPB (0 g Intravenous Stopped 02/27/23 1330)    ED Course/ Medical Decision Making/ A&P Clinical Course as of 02/27/23 1453  Sun Feb 27, 2023  1403 Systolic function is high normal. EF: 65-67%. Wall motion is normal. Doppler parameters consistent with moderate diastolic dysfunction and elevated LA pressure. 11/11/22 [CG]    Clinical Course User Index [CG] Al Decant, PA-C    Medical Decision Making Amount and/or Complexity of Data Reviewed Labs: ordered. Radiology: ordered.  Risk Prescription drug management. Decision regarding hospitalization.   77 year old male presents to ED for evaluation.  Please see HPI for further details.  On initial examination the patient is afebrile and nontachycardic.  His lung sounds are clear bilaterally, he has an oxygen saturation of 91% on room air.  Abdomen soft and compressible throughout.  No evidence of distention. No edema to lower extremities, no evidence of volume overload. Neurological examination at baseline.   Patient workup will include CBC with differential, CMP, BNP, lactic acid x 2, troponin x 2, viral panel, blood cultures, urinalysis.  Will collect chest x-ray. Add on liter of fluid in setting of potential sepsis as patient reports new cough and fever.  Labs reviewed, echocardiogram from 11/11/2022 which shows an ejection fraction of 65 to 67%.   Update: Patient BNP elevated at 744.7.  Chest x-ray shows new bilateral central pulmonary airspace opacity in the batwing configuration suspicious for acute pulmonary edema with infection considered less likely.  Once these imaging studies and lab values were acknowledged, patient had received 500 mL of fluid.  Went to reassess patient and he stated that he felt more short of breath, he had crackles on his lungs at this time. Dr. Dalene Seltzer called to bedside. The patient was  switched over to a nonrebreather and given 40 mg of IV Lasix.  The patient was then transitioned to BiPAP.  On BiPAP, the patient reported feeling much better and stated "this is as good as I have felt in the last month".  The patient has diuresed well after 40 mg IV Lasix administered. Awaiting CT angiogram  CT angiogram collected which shows no evidence of PE however dense airspace consolidations within the bilateral  perihilar regions and within the right upper lobe with surrounding glass opacity.  These findings favored to represent pulmonary edema over infectious etiology.  Patient lactic acid 0.8.  Patient CBC shows leukocytosis 11.7, hemoglobin 11.2.  CMP with sodium 128, glucose 138, bilirubin 1.4.  Urinalysis unremarkable.  Viral panel negative for all.  Troponin 45, delta 50.  Patient started on vancomycin, cefepime for broad-spectrum coverage in the setting of potential infection.  Discussed patient case with hospitalist Dr. Robb Matar who agrees to admit patient for further management.   Final Clinical Impression(s) / ED Diagnoses Final diagnoses:  Other hypervolemia    Rx / DC Orders ED Discharge Orders     None             Al Decant, PA-C 02/27/23 1713    Alvira Monday, MD 02/28/23 1150

## 2023-02-27 NOTE — Progress Notes (Signed)
   02/27/23 2356  BiPAP/CPAP/SIPAP  BiPAP/CPAP/SIPAP Pt Type Adult  BiPAP/CPAP/SIPAP V60  Mask Type Full face mask  Mask Size Large  Set Rate 12 breaths/min  Respiratory Rate 22 breaths/min  IPAP 10 cmH20  EPAP 5 cmH2O  FiO2 (%) 28 %  Minute Ventilation 16  Leak 18  Peak Inspiratory Pressure (PIP) 9  Tidal Volume (Vt) 716  Patient Home Equipment No  Auto Titrate No  Press High Alarm 35 cmH2O  Press Low Alarm 5 cmH2O  Oxygen Percent 28 %  BiPAP/CPAP /SiPAP Vitals  Pulse Rate 80  Resp (!) 22  BP (!) 127/34  SpO2 95 %  Bilateral Breath Sounds Diminished  MEWS Score/Color  MEWS Score 1  MEWS Score Color Green

## 2023-02-27 NOTE — ED Triage Notes (Signed)
Pt arrived via POV. C/o SOB w/productive cough for 1x day. Dyspnea at rest  AOX4.

## 2023-02-27 NOTE — H&P (Signed)
History and Physical    Patient: Randy Duncan:[id number] DOB: 1946/01/09 DOA: 02/27/2023 DOS: the patient was seen and examined on 02/27/2023 PCP: Tracey Harries, MD  Patient coming from: Home  Chief Complaint:  Chief Complaint  Patient presents with   Shortness of Breath   Cough   HPI: Randy Duncan is a 77 y.o. male with medical history significant of celiac disease, fatigue, hypersomnia, hyperglycemia, impaired fasting glucose, sleep disorder, emphysema, hypertension, CAD, abnormal nuclear stress test, aortic valve replacement, PVCs, pancytopenia who presented to the emergency department complaints of fever of 101 F at home, dark sputum productive cough, chest pain and dyspnea since yesterday.  Per patient, he had sudden onset of chest tightness and dyspnea last month while playing golf and underwent a nuclear stress test followed by a cardiac catheterization.  He also stated that for the past 2-year he has been having dry mouth and feels like he is always dehydrated.  He is scheduled to see endocrinology for possible diabetes insipidus.  He denied rhinorrhea, sore throat, wheezing or hemoptysis.  No palpitations, diaphoresis, but positive nocturia, PND, orthopnea and pitting edema of the lower extremities.  No abdominal pain, nausea, emesis, diarrhea, melena or hematochezia.  No flank pain, dysuria, frequency or hematuria.  ED course: Initial vital signs were temperature 97.8 F, pulse 82, respirations 23, BP 147/50 mmHg O2 sat 92% on room air.  The patient received 1000 mL of normal saline bolus, cefepime 2 g IVPB, vancomycin 1500 mg IVPB, labetalol 5 mg IVP, furosemide 40 mg IVP and a sublingual nitroglycerin.  Lab work: Urinalysis was colorless with ketones of 5 mg/dL, but otherwise unremarkable.  CBC showed a white count 11.7, hemoglobin 11.2 g/dL platelets 865.  Lactic acid x 2 normal.  Troponin was 45 and 59 mg/L.  BNP 744.7 pg/mL.  CMP showed a sodium 128 and CO2 of 20 mmol/L  with a normal anion gap.  Renal function was normal.  Glucose 138 and total bilirubin 1.4 mg/dL.  Total protein 6.2 and albumin 3.2 g/dL.  The rest of the electrolytes and LFTs were normal after calcium was corrected.  Respiratory panel by PCR is pending.  Imaging: 2 view chest radiograph showing new bilateral central pulmonary airspace opacity batwing configuration suspicious for acute pulmonary edema, with infection considered less likely.   Review of Systems: As mentioned in the history of present illness. All other systems reviewed and are negative. Past Medical History:  Diagnosis Date   Abdominal bloating    Celiac disease    Fatigue    Hypersomnia with sleep apnea, unspecified 06/22/2013   Hypoglycemia    Impaired fasting glucose    Sleep disorder    Snoring    Snoring 06/22/2013   Past Surgical History:  Procedure Laterality Date   AORTIC VALVE REPAIR  11/2006   CATARACT EXTRACTION Bilateral 2010   TUMOR EXCISION     Left parotid gland (done in [facility]   Social History:  reports that he has quit smoking. His smoking use included cigarettes. He has a 40.00 pack-year smoking history. He has never used smokeless tobacco. He reports that he does not drink alcohol and does not use drugs.  Allergies  Allergen Reactions   Losartan Potassium Itching and Swelling   Naproxen Other (See Comments)    Sweats and stomach upset   Sweats and stomach upset   Wheat Other (See Comments) and Rash    Celiac disease   Keflex [Cephalexin]     upset stomach  Wellbutrin [Bupropion]     Upset stomach    Family History  Problem Relation Age of Onset   Emphysema Maternal Grandfather    Celiac disease Daughter    Lung disease Neg Hx     Prior to Admission medications   Medication Sig Start Date End Date Taking? Authorizing Provider  aspirin 81 MG tablet Take 81 mg by mouth daily.   Yes [provider]  atorvastatin (LIPITOR) 80 MG tablet Take 80 mg by mouth at bedtime.   Yes  [provider]  cholecalciferol (VITAMIN D3) 25 MCG (1000 UNIT) tablet Take 1,000 Units by mouth daily.   Yes [provider]  clonazePAM (KLONOPIN) 0.5 MG tablet Take 0.25 mg by mouth every 4 (four) hours. 05/10/13  Yes [provider]  hydrALAZINE (APRESOLINE) 25 MG tablet Take 25 mg by mouth 2 (two) times daily. 02/17/23  Yes [provider]  metoprolol succinate (TOPROL-XL) 25 MG 24 hr tablet Take 12.5 mg by mouth daily.   Yes [provider]  ranolazine (RANEXA) 1000 MG SR tablet Take 1,000 mg by mouth 2 (two) times daily.   Yes [provider]  amLODipine (NORVASC) 10 MG tablet Take 10 mg by mouth daily. Patient not taking: Reported on 02/27/2023 01/24/23   [provider]    Physical Exam: Vitals:   02/27/23 1200 02/27/23 1230 02/27/23 1233 02/27/23 1400  BP: (!) 191/62 (!) 170/84 (!) 121/45 (!) 130/43  Pulse: 98 (!) 105 83 76  Resp: 20 (!) 24 (!) 32 (!) 30  Temp:   97.8 F (36.6 C) 97.8 F (36.6 C)  TempSrc:   Oral Oral  SpO2: 99% 97% 93% 98%  Weight:      Height:       Physical Exam Vitals and nursing note reviewed.  Constitutional:      General: He is awake. He is not in acute distress.    Appearance: He is well-developed.     Interventions: Face mask in place.     Comments: BiPAP mask on.  HENT:     Head: Normocephalic.     Nose: No rhinorrhea.     Mouth/Throat:     Mouth: Mucous membranes are moist.  Eyes:     General: No scleral icterus.    Pupils: Pupils are equal, round, and reactive to light.  Neck:     Vascular: No JVD.  Cardiovascular:     Rate and Rhythm: Normal rate and regular rhythm.     Heart sounds: S1 normal and S2 normal.  Pulmonary:     Effort: Pulmonary effort is normal. No tachypnea or accessory muscle usage.     Breath sounds: Examination of the right-lower field reveals rales. Examination of the left-lower field reveals rales. Wheezing and rales present. No decreased breath sounds  or rhonchi.  Abdominal:     Palpations: Abdomen is soft.  Musculoskeletal:     Cervical back: Neck supple.     Right lower leg: 1+ Edema present.     Left lower leg: 1+ Edema present.  Skin:    General: Skin is warm and dry.  Neurological:     General: No focal deficit present.     Mental Status: He is alert and oriented to person, place, and time.  Psychiatric:        Mood and Affect: Mood normal.        Behavior: Behavior normal. Behavior is cooperative.   Data Reviewed:  Results are pending, will review when  available. 11/11/2022 TTE without enhancing agent Impression  Left Ventricle: Systolic function is high normal. EF: 65-67%.   Left Ventricle: Wall motion is normal.   Left Ventricle: Doppler parameters consistent with moderate diastolic dysfunction and elevated LA pressure.   Mitral Valve: There is moderate annular calcification.   Aortic Valve: Trace aortic valve regurgitation.   Aortic Valve: There is no evidence of aortic valve stenosis. Peak velocity 2.3 m/sec. Peak gradient 21 mmHg. DI 0.56.   Aortic Valve: The aortic valve is tricuspid. The leaflets are slightly thickened and exhibit normal excursion.   Aortic Valve: There is a bioprosthetic valve.   Aortic Valve: The prosthetic valve appears to be well-seated without rocking and appears to be functioning normally. The prosthetic valve function is normal, with peak and mean gradients of 22.000 and 11.000 mmHg.   Aortic Valve: The aortic valve was not well visualized.   Tricuspid Valve: The right ventricular systolic pressure is mildly elevated - about 39 mmHg.  01/28/2023 NM Heart Spect Ph Stress Impression:   1. No chest pain with stress. 2. No ST changes to suggest ischemia. 3. Normal LV systolic function and wall motion. 4. No ischemia by perfusion imaging  \02/04/2023 cardiac catheterization Coronary Angiography:   Antatomically right dominant  Mild fluoroscopic coronary calcification   Left  Main: Normal  Left anterior descending artery: Mild irregularities.   No focal angiographic or obstructive stenosis  Diagonal branches: 2 diagonal branches.  First diagonal branch has an  upper takeoff and has an ostial stenosis that appears to be 50 to 60%.   The vessel appears to be larger than the time of his previous  catheterization 4 years ago.  There is TIMI-3 flow in the diagonal branch.  Second diagonal branch has mild irregularities  Left Circumflex:  Anatomically nondominant.  Minor luminal irregularities.  No focal mammographic obstructive stenosis  Obtuse Marginal branches: Mild irregularities  Right Coronary Artery:  Anatomically dominant.  Large vessel.  Ectasia in  the midsegment.  30% stenosis in the midsegment.  Mild irregularities  proximally and distally.  No focal angiographic or obstructive stenosis.  Posterior Descending Artery: Mild irregularities  Posterolateral ventricular branch: Moderate-sized vessel.  Mild  irregularities   Left Ventriculography: Not performed   CONCLUSIONS: Fatigue and weakness Hypertensive hemodynamics Elevated left ventricular end-diastolic filling pressure Nonobstructive coronary artery disease.   Moderate branch vessel disease of the ostial proximal first diagonal branch.   Mild nonobstructive disease in the right coronary artery. False positive Cardiolite stress test Status post aortic valve replacement with no significant hemodynamic gradient  RECOMMENDATIONS: Continue with medical management and risk factor modification. Follow-up with Dr. Fransisco Beau for further evaluation and management.  Assessment and Plan: Principal Problem:   Acute respiratory failure with hypoxia (HCC) In the setting of:   Pulmonary edema Observation/SDU. Supplemental oxygen as needed. Continue BiPAP ventilation as needed. Sodium and fluid restriction. Continue furosemide 40 mg IVP twice daily. Monitor daily weights, intake and output. Monitor renal  function electrolytes. Cardiology consult requested for a.m. Given subjective fever and cough Will continue ceftriaxone and azithromycin. Will check procalcitonin level today and tomorrow morning.  Active Problems:   Elevated troponin In the the setting of history of   Coronary artery disease involving native  coronary artery of native heart without angina pectoris Continue low-dose aspirin and beta-blocker. Continue ranolazine 1000 mg p.o. twice daily. Continue atorvastatin 80 mg p.o. at. Will consult cardiology in AM.    Hyponatremia The patient drinks a lot of water  daily. Will fluid restrict for now. Will wait for sodium level before redosing furosemide. He is supposed to see endocrinology in the near future.    Hypersomnia with sleep apnea BiPAP at bedtime.    Essential hypertension Continue hydralazine 25 mg p.o. twice daily.    Pressure injury of skin Continue preventive measures.    Normocytic anemia Monitor hematocrit and hemoglobin.    Thrombocytopenia (HCC) Monitor platelet count.    Hyperbilirubinemia Recheck bilirubin level in AM.    Mild protein malnutrition (HCC) May be due to hemodilution. Protein supplementation as needed.     Advance Care Planning:   Code Status: Full Code   Consults: Sent message to cardiology master for consult in AM.  Family Communication: His spouse was at bedside.  Severity of Illness: The appropriate patient status for this patient is INPATIENT. Inpatient status is judged to be reasonable and necessary in order to provide the required intensity of service to ensure the patient's safety. [name] presenting symptoms, physical exam findings, and initial radiographic and laboratory data in the context of their chronic comorbidities is felt to place them at high risk for further clinical deterioration. Furthermore, it is not anticipated that the patient will be medically stable for discharge from the hospital within 2  midnights of admission.   * I certify that at the point of admission it is my clinical judgment that the patient will require inpatient hospital care spanning beyond 2 midnights from the point of admission due to high intensity of service, high risk for further deterioration and high frequency of surveillance required.*  Author: Bobette Mo, MD 02/27/2023 2:33 PM  For on call review [email or web address].   This document was prepared using Dragon voice recognition software and may contain some unintended transcription errors.

## 2023-02-27 NOTE — Progress Notes (Signed)
[facility] admitting physician addendum:  The nursing staff reported that the patient again developed chest tightness and dyspnea, but not as severe as earlier.  Sublingual nitroglycerin and repeat cardiac biomarkers have been ordered.  We will continue BiPAP ventilation.  Sanda Klein, MD.

## 2023-02-27 NOTE — ED Notes (Signed)
Complaints of shortness of breath. No distress noted. O2 sat decreased to 88% on room air. Placed on 2L oxygen via [place].

## 2023-02-28 ENCOUNTER — Inpatient Hospital Stay (HOSPITAL_COMMUNITY): Payer: Medicare HMO

## 2023-02-28 DIAGNOSIS — R072 Precordial pain: Secondary | ICD-10-CM

## 2023-02-28 DIAGNOSIS — I5043 Acute on chronic combined systolic (congestive) and diastolic (congestive) heart failure: Secondary | ICD-10-CM

## 2023-02-28 DIAGNOSIS — J9601 Acute respiratory failure with hypoxia: Secondary | ICD-10-CM | POA: Diagnosis not present

## 2023-02-28 DIAGNOSIS — R7989 Other specified abnormal findings of blood chemistry: Secondary | ICD-10-CM

## 2023-02-28 DIAGNOSIS — Z952 Presence of prosthetic heart valve: Secondary | ICD-10-CM | POA: Diagnosis not present

## 2023-02-28 LAB — COMPREHENSIVE METABOLIC PANEL
ALT: 39 U/L (ref 0–44)
AST: 30 U/L (ref 15–41)
Albumin: 2.5 g/dL — ABNORMAL LOW (ref 3.5–5.0)
Alkaline Phosphatase: 46 U/L (ref 38–126)
Anion gap: 8 (ref 5–15)
BUN: 15 mg/dL (ref 8–23)
CO2: 21 mmol/L — ABNORMAL LOW (ref 22–32)
Calcium: 7.9 mg/dL — ABNORMAL LOW (ref 8.9–10.3)
Chloride: 103 mmol/L (ref 98–111)
Creatinine, Ser: 0.94 mg/dL (ref 0.61–1.24)
GFR, Estimated: >60 mL/min (ref >60)
Glucose, Bld: 105 mg/dL — ABNORMAL HIGH (ref 70–99)
Potassium: 4 mmol/L (ref 3.5–5.1)
Sodium: 132 mmol/L — ABNORMAL LOW (ref 135–145)
Total Bilirubin: 1 mg/dL (ref 0.3–1.2)
Total Protein: 5.1 g/dL — ABNORMAL LOW (ref 6.5–8.1)

## 2023-02-28 LAB — ECHOCARDIOGRAM COMPLETE
AR max vel: 1.53 cm2
AV Area VTI: 1.46 cm2
AV Area mean vel: 1.5 cm2
AV Mean grad: 20 mmHg
AV Peak grad: 37.9 mmHg
Ao pk vel: 3.08 m/s
Area-P 1/2: 4.29 cm2
Calc EF: 65.5 %
Height: 67 in
MV M vel: 5.45 m/s
MV Peak grad: 118.8 mmHg
P 1/2 time: 147 msec
S' Lateral: 3 cm
Single Plane A2C EF: 64.2 %
Single Plane A4C EF: 67.4 %
Weight: 2567.92 oz

## 2023-02-28 LAB — PHOSPHORUS: Phosphorus: 4.2 mg/dL (ref 2.5–4.6)

## 2023-02-28 LAB — CBC
HCT: 28 % — ABNORMAL LOW (ref 39.0–52.0)
Hemoglobin: 9.7 g/dL — ABNORMAL LOW (ref 13.0–17.0)
MCH: 33.8 pg (ref 26.0–34.0)
MCHC: 34.6 g/dL (ref 30.0–36.0)
MCV: 97.6 fL (ref 80.0–100.0)
Platelets: 129 10*3/uL — ABNORMAL LOW (ref 150–400)
RBC: 2.87 MIL/uL — ABNORMAL LOW (ref 4.22–5.81)
RDW: 11.9 % (ref 11.5–15.5)
WBC: 8.6 10*3/uL (ref 4.0–10.5)
nRBC: 0 % (ref 0.0–0.2)

## 2023-02-28 LAB — PROCALCITONIN
Procalcitonin: <0.1 ng/mL
Procalcitonin: <0.1 ng/mL

## 2023-02-28 LAB — HEMOGLOBIN A1C
Hgb A1c MFr Bld: 5.3 % (ref 4.8–5.6)
Mean Plasma Glucose: 105.41 mg/dL

## 2023-02-28 LAB — T4, FREE: Free T4: 1.54 ng/dL — ABNORMAL HIGH (ref 0.61–1.12)

## 2023-02-28 LAB — STREP PNEUMONIAE URINARY ANTIGEN: Strep Pneumo Urinary Antigen: NEGATIVE

## 2023-02-28 LAB — MRSA NEXT GEN BY PCR, NASAL: MRSA by PCR Next Gen: NOT DETECTED

## 2023-02-28 LAB — TSH: TSH: 1.442 u[IU]/mL (ref 0.350–4.500)

## 2023-02-28 MED ORDER — FUROSEMIDE 10 MG/ML IJ SOLN
40.0000 mg | Freq: Two times a day (BID) | INTRAMUSCULAR | Status: DC
  Administered 2023-02-28 – 2023-03-02 (×5): 40 mg via INTRAVENOUS
  Filled 2023-02-28 (×5): qty 4

## 2023-02-28 NOTE — Progress Notes (Signed)
   02/28/23 0426  BiPAP/CPAP/SIPAP  BiPAP/CPAP/SIPAP Pt Type Adult  BiPAP/CPAP/SIPAP V60  Mask Type Full face mask  Mask Size Large  Set Rate 12 breaths/min  Respiratory Rate 18 breaths/min  IPAP 10 cmH20  EPAP 5 cmH2O  FiO2 (%) 28 %  Minute Ventilation 11  Leak 15  Peak Inspiratory Pressure (PIP) 10  Tidal Volume (Vt) 648  Patient Home Equipment No  Auto Titrate No  Press High Alarm 35 cmH2O  Press Low Alarm 5 cmH2O  Oxygen Percent 28 %  BiPAP/CPAP /SiPAP Vitals  Pulse Rate 81  Resp 19  BP (!) 128/29  SpO2 98 %  Bilateral Breath Sounds Diminished  MEWS Score/Color  MEWS Score 0  MEWS Score Color [place] Si

## 2023-02-28 NOTE — Progress Notes (Signed)
Discussed mobilization benefits. Patient states he is feeling some better but still fatigued and request to rest at this time.

## 2023-02-28 NOTE — Progress Notes (Signed)
  Echocardiogram 2D Echocardiogram has been performed.  Randy Duncan 02/28/2023, 1:57 PM

## 2023-02-28 NOTE — TOC Initial Note (Signed)
Transition of Care [facility]) - Initial/Assessment Note   Patient Details  Name: Randy Duncan MRN: [id number] Date of Birth: September 13, 1946  [facility] CM/SW Contact:    Ewing Schlein, LCSW Phone Number: 02/28/2023, 9:24 AM  Clinical Narrative: [facility] consulted for heart failure screening. Per chart review, patient has no inpatient admissions in the past year and 2 ED visits in the past 6 months, so he does not meet criteria for a screening at this time. TOC awaiting PT evaluation and recommendations.  Expected Discharge Plan:  (To be determined) Barriers to Discharge: Continued Medical Work up  Expected Discharge Plan and Services In-house Referral: Clinical Social Work Living arrangements for the past 2 months: Mobile Home  Prior Living Arrangements/Services Living arrangements for the past 2 months: Mobile Home Lives with:: Spouse Patient language and need for interpreter reviewed:: Yes Need for Family Participation in Patient Care: No (Comment) Care giver support system in place?: Yes (comment) Criminal Activity/Legal Involvement Pertinent to Current Situation/Hospitalization: No - Comment as needed  Activities of Daily Living Home Assistive Devices/Equipment: None ADL Screening (condition at time of admission) Patient's cognitive ability adequate to safely complete daily activities?: Yes Is the patient deaf or have difficulty hearing?: No Does the patient have difficulty seeing, even when wearing glasses/contacts?: No Does the patient have difficulty concentrating, remembering, or making decisions?: No Patient able to express need for assistance with ADLs?: Yes Does the patient have difficulty dressing or bathing?: No Independently performs ADLs?: No Communication: Independent Dressing (OT): Independent Grooming: Independent Feeding: Independent Bathing: Dependent Is this a change from baseline?: Pre-admission baseline Toileting: Needs assistance Is this a  change from baseline?: Pre-admission baseline In/Out Bed: Needs assistance Is this a change from baseline?: Pre-admission baseline Walks in Home: Needs assistance Is this a change from baseline?: Pre-admission baseline Does the patient have difficulty walking or climbing stairs?: Yes Weakness of Legs: Both Weakness of Arms/Hands: Both  Emotional Assessment Orientation: : Oriented to Self, Oriented to Place, Oriented to  Time, Oriented to Situation Alcohol / Substance Use: Not Applicable Psych Involvement: No (comment)  Admission diagnosis:  Acute respiratory failure with hypoxia (HCC) [J96.01] Other hypervolemia [E87.79] Patient Active Problem List   Diagnosis Date Noted   Acute respiratory failure with hypoxia (HCC) 02/27/2023   Pressure injury of skin 02/27/2023   Normocytic anemia 02/27/2023   Thrombocytopenia (HCC) 02/27/2023   Hyponatremia 02/27/2023   Hyperbilirubinemia 02/27/2023   Mild protein malnutrition (HCC) 02/27/2023   Elevated troponin 02/27/2023   Pulmonary edema 02/27/2023   Pancytopenia (HCC) 05/07/2022   Centrilobular emphysema (HCC) 01/28/2022   Benzodiazepine dependence (HCC) 06/05/2020   Coronary artery disease involving native coronary artery of native heart without angina pectoris 10/25/2018   Essential hypertension 10/02/2018   Pulmonary nodules 02/01/2018   Hypersomnia with sleep apnea 06/22/2013   Snoring 06/22/2013   History of aortic valve replacement with tissue graft 02/05/2011   Anxiety state 04/24/2007   Irritable bowel syndrome 04/24/2007   PCP:  Tracey Harries, MD Pharmacy:   [facility] [phone] - [address], [place] - [address] [place] [address] Phone: [phone] Fax: [phone]  Social Determinants of Health (SDOH) Social History: SDOH Screenings   Food Insecurity: No Food Insecurity (02/27/2023)  Housing: Patient Declined (02/27/2023)  Transportation Needs: No Transportation Needs (02/27/2023)   Utilities: Not At Risk (02/27/2023)  Tobacco Use: Medium Risk (02/27/2023)   SDOH Interventions:    Readmission Risk Interventions     No data to display

## 2023-02-28 NOTE — Progress Notes (Signed)
PROGRESS NOTE    [name]  ZOX:[id number] DOB: 12/26/1945 DOA: 02/27/2023 PCP: Tracey Harries, MD    Brief Narrative:  Randy Duncan is a 77 y.o. male with medical history significant of celiac disease, fatigue, hypersomnia, hyperglycemia, impaired fasting glucose, sleep disorder, emphysema, hypertension, CAD, abnormal nuclear stress test, aortic valve replacement, PVCs, pancytopenia who presented to the emergency department complaints of fever of 101 F at home, dark sputum productive cough, chest pain and dyspnea since yesterday.  Per patient, he had sudden onset of chest tightness and dyspnea last month while playing golf and underwent a nuclear stress test followed by a cardiac catheterization.  He also stated that for the past 2-year he has been having dry mouth and feels like he is always dehydrated.  He is scheduled to see endocrinology for possible diabetes insipidus.  He denied rhinorrhea, sore throat, wheezing or hemoptysis.  No palpitations, diaphoresis, but positive nocturia, PND, orthopnea and pitting edema of the lower extremities.  No abdominal pain, nausea, emesis, diarrhea, melena or hematochezia.  No flank pain, dysuria, frequency or hematuria.    Assessment and Plan:   Acute respiratory failure with hypoxia (HCC) In the setting of:   Pulmonary edema Supplemental oxygen as needed wean to off-- not on O2 at home Sodium and fluid restriction. - furosemide 40 mg IVP twice daily. Monitor daily weights, intake and output. Monitor renal function electrolytes. Cardiology consulted -procal negative-- will d/c abx, NP swab pending, COVID negative  -echo pending -recent heart cath for abnormal stress test (follows at [facility])    Elevated troponin In the the setting of history of   Coronary artery disease involving native  coronary artery of native heart without angina pectoris Continue low-dose aspirin and beta-blocker. Continue ranolazine 1000 mg p.o. twice  daily. Continue atorvastatin 80 mg p.o. at.      Hyponatremia -improved with IV lasix     Hypersomnia with sleep apnea BiPAP at bedtime.     Essential hypertension Continue hydralazine 25 mg p.o. twice daily.     Pressure injury of skin Pressure Injury 02/27/23 Sacrum Stage 1 -  Intact skin with non-blanchable redness of a localized area usually over a bony prominence. (Active)  02/27/23 1739  Location: Sacrum  Location Orientation:   Staging: Stage 1 -  Intact skin with non-blanchable redness of a localized area usually over a bony prominence.  Wound Description (Comments):   Present on Admission: Yes         Normocytic anemia Monitor hematocrit and hemoglobin.     Thrombocytopenia (HCC) Monitor platelet count.       Mild protein malnutrition (HCC) May be due to hemodilution. Protein supplementation as needed.    PT eval-- family concerned about patient's ability to care for self at home    DVT prophylaxis: SCDs Start: 02/27/23 1621    Code Status: Full Code Family Communication: son at bedside  Disposition Plan:  Level of care: Stepdown Status is: Inpatient Remains inpatient appropriate     Consultants:  cards   Subjective: Breathing better than admission Does not wear O2 at home Plays golf  Objective: Vitals:   02/28/23 0700 02/28/23 0800 02/28/23 0900 02/28/23 1000  BP: (!) 136/33 (!) 154/41 (!) 148/47   Pulse: 80 86 84 84  Resp: 19 (!) 22 (!) 25 15  Temp:  98.4 F (36.9 C)    TempSrc:  Oral    SpO2: 96% 96% 95% 98%  Weight:      Height:  Intake/Output Summary (Last 24 hours) at 02/28/2023 1008 Last data filed at 02/28/2023 0600 Gross per 24 hour  Intake 1770.53 ml  Output 1350 ml  Net 420.53 ml   Filed Weights   02/27/23 0857 02/28/23 0600  Weight: 71.2 kg 72.8 kg    Examination:   General: Appearance:     Overweight male in no acute distress     Lungs:     respirations unlabored  Heart:    Normal heart rate.      MS:   All extremities are intact.    Neurologic:   Awake, alert       Data Reviewed: I have personally reviewed following labs and imaging studies  CBC: Recent Labs  Lab 02/27/23 0920 02/28/23 0306  WBC 11.7* 8.6  NEUTROABS 9.4*  --   HGB 11.2* 9.7*  HCT 32.0* 28.0*  MCV 97.0 97.6  PLT 134* 129*   Basic Metabolic Panel: Recent Labs  Lab 02/27/23 0920 02/27/23 1949 02/28/23 0306  NA 128*  --  132*  K 4.0  --  4.0  CL 98  --  103  CO2 20*  --  21*  GLUCOSE 138*  --  105*  BUN 15  --  15  CREATININE 0.83  --  0.94  CALCIUM 8.4*  --  7.9*  MG  --  1.8  --   PHOS  --   --  4.2   GFR: Estimated Creatinine Clearance: 61.5 mL/min (by C-G formula based on SCr of 0.94 mg/dL). Liver Function Tests: Recent Labs  Lab 02/27/23 0920 02/28/23 0306  AST 28 30  ALT 39 39  ALKPHOS 59 46  BILITOT 1.4* 1.0  PROT 6.2* 5.1*  ALBUMIN 3.2* 2.5*   No results for input(s): "LIPASE", "AMYLASE" in the last 168 hours. No results for input(s): "AMMONIA" in the last 168 hours. Coagulation Profile: No results for input(s): "INR", "PROTIME" in the last 168 hours. Cardiac Enzymes: No results for input(s): "CKTOTAL", "CKMB", "CKMBINDEX", "TROPONINI" in the last 168 hours. BNP (last 3 results) No results for input(s): "PROBNP" in the last 8760 hours. HbA1C: No results for input(s): "HGBA1C" in the last 72 hours. CBG: No results for input(s): "GLUCAP" in the last 168 hours. Lipid Profile: No results for input(s): "CHOL", "HDL", "LDLCALC", "TRIG", "CHOLHDL", "LDLDIRECT" in the last 72 hours. Thyroid Function Tests: No results for input(s): "TSH", "T4TOTAL", "FREET4", "T3FREE", "THYROIDAB" in the last 72 hours. Anemia Panel: No results for input(s): "VITAMINB12", "FOLATE", "FERRITIN", "TIBC", "IRON", "RETICCTPCT" in the last 72 hours. Sepsis Labs: Recent Labs  Lab 02/27/23 1005 02/27/23 1210 02/27/23 1213 02/28/23 0306  PROCALCITON  --   --  <0.10 <0.10  LATICACIDVEN 0.9 0.8   --   --     Recent Results (from the past 240 hour(s))  Blood culture (routine x 2)     Status: None (Preliminary result)   Collection Time: 02/27/23 10:05 AM   Specimen: Right Antecubital; Blood  Result Value Ref Range Status   Specimen Description   Final    RIGHT ANTECUBITAL BLOOD Performed at [facility], [address], [place], [place] [address]    Special Requests   Final    BOTTLES DRAWN AEROBIC AND ANAEROBIC Blood Culture adequate volume Performed at [facility], [address], [place], [place] [address]    Culture   Final    NO GROWTH < 24 HOURS Performed at [facility], [address] [place], [place] [address]  Report Status PENDING  Incomplete  Blood culture (routine x 2)     Status: None (Preliminary result)   Collection Time: 02/27/23 10:06 AM   Specimen: BLOOD LEFT ARM  Result Value Ref Range Status   Specimen Description   Final    BLOOD LEFT ARM Performed at [facility], [address], [place], [place] [address]    Special Requests   Final    BOTTLES DRAWN AEROBIC AND ANAEROBIC Blood Culture adequate volume Performed at [facility], [address] [place], [place] [address]    Culture   Final    NO GROWTH < 24 HOURS Performed at [facility], [address], [place], [place] [address]    Report Status PENDING  Incomplete  Resp panel by RT-PCR (RSV, Flu A&B, Covid) Anterior Nasal Swab     Status: None   Collection Time: 02/27/23 12:43 PM   Specimen: Anterior Nasal Swab  Result Value Ref Range Status   SARS Coronavirus 2 by RT PCR NEGATIVE NEGATIVE Final    Comment: (NOTE) SARS-CoV-2 target nucleic acids are NOT DETECTED.  The SARS-CoV-2 RNA is generally detectable in upper respiratory specimens during the acute phase of infection. The lowest concentration of SARS-CoV-2 viral copies this assay can detect is 138 copies/mL. A negative result does not preclude  SARS-Cov-2 infection and should not be used as the sole basis for treatment or other patient management decisions. A negative result may occur with  improper specimen collection/handling, submission of specimen other than nasopharyngeal swab, presence of viral mutation(s) within the areas targeted by this assay, and inadequate number of viral copies(<138 copies/mL). A negative result must be combined with clinical observations, patient history, and epidemiological information. The expected result is Negative.  Fact Sheet for Patients:  BloggerCourse.com  Fact Sheet for Healthcare Providers:  SeriousBroker.it  This test is no t yet approved or cleared by the [place] FDA and  has been authorized for detection and/or diagnosis of SARS-CoV-2 by FDA under an Emergency Use Authorization (EUA). This EUA will remain  in effect (meaning this test can be used) for the duration of the COVID-19 declaration under Section 564(b)(1) of the Act, 21 U.S.C.section 360bbb-3(b)(1), unless the authorization is terminated  or revoked sooner.       Influenza A by PCR NEGATIVE NEGATIVE Final   Influenza B by PCR NEGATIVE NEGATIVE Final    Comment: (NOTE) The Xpert Xpress SARS-CoV-2/FLU/RSV plus assay is intended as an aid in the diagnosis of influenza from Nasopharyngeal swab specimens and should not be used as a sole basis for treatment. Nasal washings and aspirates are unacceptable for Xpert Xpress SARS-CoV-2/FLU/RSV testing.  Fact Sheet for Patients: BloggerCourse.com  Fact Sheet for Healthcare Providers: SeriousBroker.it  This test is not yet approved or cleared by the [place] FDA and has been authorized for detection and/or diagnosis of SARS-CoV-2 by FDA under an Emergency Use Authorization (EUA). This EUA will remain in effect (meaning this test can be used) for the duration of  the COVID-19 declaration under Section 564(b)(1) of the Act, 21 U.S.C. section 360bbb-3(b)(1), unless the authorization is terminated or revoked.     Resp Syncytial Virus by PCR NEGATIVE NEGATIVE Final    Comment: (NOTE) Fact Sheet for Patients: BloggerCourse.com  Fact Sheet for Healthcare Providers: SeriousBroker.it  This test is not yet approved or cleared by the [place] FDA and has been authorized for detection and/or diagnosis of SARS-CoV-2 by FDA under an Emergency Use  Authorization (EUA). This EUA will remain in effect (meaning this test can be used) for the duration of the COVID-19 declaration under Section 564(b)(1) of the Act, 21 U.S.C. section 360bbb-3(b)(1), unless the authorization is terminated or revoked.  Performed at [facility], [address] [place], [place] [address]          Radiology Studies: CT Angio Chest PE W and/or Wo Contrast  Result Date: 02/27/2023 CLINICAL DATA:  Pulmonary embolism (PE) suspected, high prob EXAM: CT ANGIOGRAPHY CHEST WITH CONTRAST TECHNIQUE: Multidetector CT imaging of the chest was performed using the standard protocol during bolus administration of intravenous contrast. Multiplanar CT image reconstructions and MIPs were obtained to evaluate the vascular anatomy. RADIATION DOSE REDUCTION: This exam was performed according to the departmental dose-optimization program which includes automated exposure control, adjustment of the mA and/or kV according to patient size and/or use of iterative reconstruction technique. CONTRAST:  75mL OMNIPAQUE IOHEXOL 350 MG/ML SOLN COMPARISON:  X-ray 02/27/2023 FINDINGS: Cardiovascular: Satisfactory opacification of the pulmonary arteries to the segmental level. No evidence of pulmonary embolism. Thoracic aorta is nonaneurysmal. Aortic valve replacement. Scattered atherosclerotic vascular calcifications of the aorta and coronary  arteries. Normal heart size. No pericardial effusion. Mediastinum/Nodes: Mildly prominent mediastinal lymph nodes including 1.1 cm precarinal node (series 4, image 56) and 1.3 cm AP window node (series 4, image 50). Suspect mildly prominent bilateral hilar lymph nodes, evaluation of which is limited in the presence of dense perihilar opacities. No axillary lymphadenopathy. Thyroid gland, trachea, and esophagus demonstrate no significant abnormality. Lungs/Pleura: Moderate layering bilateral pleural effusions with compressive atelectasis of the bilateral lower lobes. Dense airspace consolidations within the bilateral perihilar regions and within the right upper lobe. Surrounding ground-glass opacity. Diffuse interlobular septal thickening. No pneumothorax. Upper Abdomen: No acute abnormality. Musculoskeletal: Prior sternotomy. No acute bony abnormality. No chest wall abnormality. Review of the MIP images confirms the above findings. IMPRESSION: 1. No evidence of pulmonary embolism. 2. Dense airspace consolidations within the bilateral perihilar regions and within the right upper lobe with surrounding ground-glass opacity. Findings are favored to represent pulmonary edema. Superimposed pneumonia would be difficult to exclude. 3. Moderate layering bilateral pleural effusions with compressive atelectasis of the bilateral lower lobes. 4. Mildly prominent mediastinal and hilar lymph nodes, likely reactive. 5. Aortic and coronary artery atherosclerosis (ICD10-I70.0). Electronically Signed   By: Duanne Guess D.O.   On: 02/27/2023 12:49   DG Chest 2 View  Result Date: 02/27/2023 CLINICAL DATA:  Shortness of breath and cough for 1 day. EXAM: CHEST - 2 VIEW COMPARISON:  01/19/2023 FINDINGS: Heart size remains within normal limits. Prior median sternotomy and aortic valve replacement noted. Bilateral central pulmonary airspace opacity is seen in a bat wing configuration which is new and suspicious for acute pulmonary  edema, with infection considered less likely. Tiny right pleural effusion also noted. IMPRESSION: New bilateral central pulmonary airspace opacity in bat wing configuration, suspicious for acute pulmonary edema, with infection considered less likely. Tiny right pleural effusion. Electronically Signed   By: Danae Orleans M.D.   On: 02/27/2023 09:32        Scheduled Meds:  albuterol  2.5 mg Nebulization TID   aspirin EC  81 mg Oral Daily   atorvastatin  80 mg Oral QHS   Chlorhexidine Gluconate Cloth  6 each Topical Q2000   furosemide  40 mg Intravenous BID   hydrALAZINE  25 mg Oral BID   metoprolol succinate  12.5 mg Oral Daily   pantoprazole  40 mg Oral Daily   ranolazine  1,000 mg Oral BID   Continuous Infusions:  sodium chloride 10 mL/hr at 02/28/23 0419     LOS: 1 day    Time spent: 45 minutes spent on chart review, discussion with nursing staff, consultants, updating family and interview/physical exam; more than 50% of that time was spent in counseling and/or coordination of care.    Joseph Art, DO [facility] Available via Epic secure chat 7am-7pm After these hours, please refer to coverage provider listed on [email or web address] 02/28/2023, 10:08 AM

## 2023-02-28 NOTE — Consult Note (Signed)
Cardiology Consultation   Patient ID: [name] MRN: [id number]; DOB: Dec 16, 1945  Admit date: 02/27/2023 Date of Consult: 02/28/2023  PCP:  Tracey Harries, MD   [facility] Providers Cardiologist:  None       [facility] Dr Fransisco Beau  Patient Profile:   Randy Duncan is a 77 y.o. male followed by Dr. Fransisco Beau with [facility] with a hx of CAD, DM2, hx of bioprosthetic aortic valve repair in 2008, hx of ascending aorta repair, centrilobular emphysema, celiac disease, hyperglycemia, sleep disorder, pancytopenia who is being seen 02/28/2023 for the evaluation of chest pain at the request of Dr. Benjamine Mola.  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  Beginning in early May he was DOE and had nuc sstudy was 01/28/23 that was borderline for inducible ischemia in inf region.  Cardiac cath 02/04/23 with 55% prox dia lesion (from prior cath in 2020 it was 90% obstructed) 30% mRCA lesion.  Has not been able to tolerate ACE inhibitor/ARB in past.  Mild allergy to amlodipine.  Was last seen 02/21/23 with cardiology and complained of palpitations at that time.  Plan for monitor.  Pt is going for pulmonary eval as well for his dyspnea with pulmonary function studies.   He had CT angio of chest 01/26/23 with no PE, no acute infiltrate or effusion. Stable atherosclerosis and valve replacement.   Echo 11/11/22 EF 65-67% and prosthetic valve well seated -appears to be functioning normally peak and mean gradients of 22.000 and 11.000 mmHg.  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  He has had fever of 101 at home, dark sputum with productive cough.  Also chest pain and dyspnea.   In ER sp02 was 92% and at one  point down to 88% RA and 02 was added.  He rec'd 1000 ml of NS and IV ABX along with labetalol. Furosemide and sl NTG for chest pain.  He was placed on BiPAP   U/a was colorless and Ketones of 5, WBC 11.7, Hgb 11.2 plts 134.  Lactic acid normal.      Na 128 K+ 4.0 Cr 0.83 albumin 3.2  today Na 132, k+ 4.0 Mg+ 1.8   BNP 744 Hs troponin 45 >> 50 >>100>>91  Procalcitonin <0.10 2V CXR New bilateral central pulmonary airspace opacity in bat wing configuration, suspicious for acute pulmonary edema, with infection considered less likely.  Tiny right pleural effusion  CTA of chest IMPRESSION: 1. No evidence of pulmonary embolism. 2. Dense airspace consolidations within the bilateral perihilar regions and within the right upper lobe with surrounding ground-glass opacity. Findings are favored to represent pulmonary edema. Superimposed pneumonia would be difficult to exclude. 3. Moderate layering bilateral pleural effusions with compressive atelectasis of the bilateral lower lobes. 4. Mildly prominent mediastinal and hilar lymph nodes, likely reactive. 5. Aortic and coronary artery atherosclerosis  EKG:  The EKG was personally reviewed and demonstrates:  SR with PVC, prolonged Qtc 493 ms, no acute ST changes from 01/19/23  Telemetry:  Telemetry was personally reviewed and demonstrates:  SR  Echo has been ordered Pt is on statin, hydralazine, toprol but per note had been changed to coreg though intolerance to coreg so back to toprol,  ranexa  has been intolerant to amlodipine in past as well as ACE/ARB   BP 136/33 to 154/41 afebrile P 87  He is + 420 despite lasix 40 of lasix last pm and now ordered 40 BID   Past Medical History:  Diagnosis Date   Abdominal bloating    Celiac disease    Fatigue    Hypersomnia with sleep apnea, unspecified 06/22/2013   Hypoglycemia    Impaired fasting glucose    Sleep disorder    Snoring    Snoring 06/22/2013    Past Surgical History:  Procedure  Laterality Date   AORTIC VALVE REPAIR  11/2006   CATARACT EXTRACTION Bilateral 2010   TUMOR EXCISION     Left parotid gland (done in [employer])     Home Medications:  Prior to Admission medications   Medication Sig Start Date End Date Taking? Authorizing Provider  aspirin EC 81 MG tablet Take 81 mg by mouth in the morning. Swallow whole.   Yes [provider]  atorvastatin (LIPITOR) 80 MG tablet Take 80 mg by mouth at bedtime.   Yes [provider]  Cholecalciferol (VITAMIN D3) 125 MCG (5000 UT) CAPS Take 5,000 Units by mouth in the morning.   Yes [provider]  clonazePAM (KLONOPIN) 0.5 MG tablet Take 0.25 mg by mouth every 4 (four) hours. 05/10/13  Yes [provider]  guaiFENesin-dextromethorphan (ROBITUSSIN DM) 100-10 MG/5ML syrup Take 5 mLs by mouth every 4 (four) hours as needed for cough.   Yes [provider]  hydrALAZINE (APRESOLINE) 25 MG tablet Take 25 mg by mouth in the morning and at bedtime. 02/17/23  Yes [provider]  metoprolol succinate (TOPROL-XL) 25 MG 24 hr tablet Take 12.5 mg by mouth at bedtime.   Yes [provider]  ranolazine (RANEXA) 1000 MG SR tablet Take 1,000 mg by mouth in the morning and at bedtime.   Yes [provider]  ZYRTEC ALLERGY 10 MG tablet Take 10 mg by mouth See admin instructions. Take 10 mg by mouth in the evening-bedtime, along with the 2nd daily dose of Hydralazine   Yes [provider]    Inpatient Medications: Scheduled Meds:  albuterol  2.5 mg Nebulization TID   aspirin EC  81 mg Oral Daily   atorvastatin  80 mg Oral QHS   Chlorhexidine Gluconate Cloth  6 each Topical Q2000   furosemide  40 mg Intravenous BID   hydrALAZINE  25 mg Oral BID   metoprolol succinate  12.5 mg Oral Daily   pantoprazole  40 mg Oral Daily   ranolazine  1,000 mg Oral BID   Continuous Infusions:  sodium chloride 10 mL/hr at 02/28/23 0419   PRN Meds: sodium chloride, acetaminophen  **OR** acetaminophen, clonazepam, nitroGLYCERIN, ondansetron **OR** ondansetron (ZOFRAN) IV, mouth rinse  Allergies:    Allergies  Allergen Reactions   Losartan Potassium Itching and Swelling   Wheat Rash and Other (See Comments)    Celiac disease   Naproxen Other (See Comments)    Sweats and stomach upset     Amlodipine    Coreg [Carvedilol]    Hydralazine Other (See Comments)    Cannot tolerate this at 3 times a day    Lisinopril    Bupropion Nausea Only and Other (See Comments)    Upset stomach   Cephalexin Nausea Only and Other (See Comments)    upset stomach    Social History:   Social History   Socioeconomic History   Marital  status: Married    Spouse name: Harriett Sine   Number of children: 3   Years of education: 14   Highest education level: Not on file  Occupational History    Employer: [employer]  Tobacco Use   Smoking status: Former    Packs/day: 1.00    Years: 40.00    Additional pack years: 0.00    Total pack years: 40.00    Types: Cigarettes   Smokeless tobacco: Never   Tobacco comments:    quit 02/2012  Vaping Use   Vaping Use: Never used  Substance and Sexual Activity   Alcohol use: No    Comment: quit in 2006   Drug use: No   Sexual activity: Not on file  Other Topics Concern   Not on file  Social History Narrative   Patient is right handed, consumes 1 soda daily,resides with wife and 2 adult children.   Social Determinants of Health   Financial Resource Strain: Not on file  Food Insecurity: No Food Insecurity (02/27/2023)   Hunger Vital Sign    Worried About Running Out of Food in the Last Year: Never true    Ran Out of Food in the Last Year: Never true  Transportation Needs: No Transportation Needs (02/27/2023)   PRAPARE - Administrator, Civil Service (Medical): No    Lack of Transportation (Non-Medical): No  Physical Activity: Not on file  Stress: Not on file  Social Connections: Not on file  Intimate Partner Violence: Not At  Risk (02/27/2023)   Humiliation, Afraid, Rape, and Kick questionnaire    Fear of Current or Ex-Partner: No    Emotionally Abused: No    Physically Abused: No    Sexually Abused: No    Family History:    Family History  Problem Relation Age of Onset   Emphysema Maternal Grandfather    Celiac disease Daughter    Lung disease Neg Hx      ROS:  Please see the history of present illness.  General:no colds + fevers to 101 at home, no weight changes Skin:no rashes or ulcers HEENT:no blurred vision, no congestion CV:see HPI PUL:see HPI GI:no diarrhea some constipation or melena, no indigestion GU:no hematuria, no dysuria MS:no joint pain, no claudication Neuro:no syncope, no lightheadedness Endo:+ diabetes, no thyroid disease  All other ROS reviewed and negative.     Physical Exam/Data:   Vitals:   02/28/23 0600 02/28/23 0700 02/28/23 0800 02/28/23 0900  BP: (!) 122/34 (!) 136/33 (!) 154/41 (!) 148/47  Pulse: 82 80 86 84  Resp: 18 19 (!) 22 (!) 25  Temp:   98.4 F (36.9 C)   TempSrc:   Oral   SpO2: 96% 96% 96% 95%  Weight: 72.8 kg     Height:        Intake/Output Summary (Last 24 hours) at 02/28/2023 0927 Last data filed at 02/28/2023 0600 Gross per 24 hour  Intake 1770.53 ml  Output 1350 ml  Net 420.53 ml      02/28/2023    6:00 AM 02/27/2023    8:57 AM 02/09/2023   11:03 AM  Last 3 Weights  Weight (lbs) 160 lb 7.9 oz 157 lb 155 lb 9.6 oz  Weight (kg) 72.8 kg 71.215 kg 70.58 kg     Body mass index is 25.14 kg/m.  General:  Well nourished, well developed, in no acute distress feels better HEENT: normal Neck: no JVD Vascular: No carotid bruits; Distal pulses 2+ bilaterally Cardiac:  normal S1, S2; RRR; + aortic murmur no gallup or rub Lungs:  clear to auscultation bilaterally, no wheezing, rhonchi or rales  Abd: soft, nontender, no hepatomegaly  Ext: no edema Musculoskeletal:  No deformities, BUE and BLE strength normal and equal Skin: warm and dry  Neuro:   alert and oriented X 3 MAE follows commands, no focal abnormalities noted Psych:  Normal affect   Relevant CV Studies: Nuc study 01/28/23 FINDINGS: There is small to medium in size, mild to moderate in intensity, partially reversible basal to mid inferior/inferolateral wall perfusion defect which could represent prior infarction with mild degree of peri-infarct ischemia. TID is normal at1.1   Catheterization, cardiac 02/03/2021 Hemodynamics: 1. Central Aortic Pressure -140/50 mmHg. Mean 87 mmHg 2. Left Ventricular Pressure-166/9 mmHg Additional comments on on hemodynamics:  LVEDP 18 mmHg.  Peak to peak aortic valve gradient 26 mmHg.  Coronary Angiography:  Antatomically right dominant Mild fluoroscopic coronary calcification   Left Main: Normal Left anterior descending artery: Mild irregularities. No focal angiographic or obstructive stenosis Diagonal branches: 2 diagonal branches. First diagonal branch has an upper takeoff and has an ostial stenosis that appears to be 50 to 60%. The vessel appears to be larger than the time of his previous catheterization 4 years ago. There is TIMI-3 flow in the diagonal branch. Second diagonal branch has mild irregularities Left Circumflex: Anatomically nondominant. Minor luminal irregularities. No focal mammographic obstructive stenosis Obtuse Marginal branches: Mild irregularities Right Coronary Artery: Anatomically dominant. Large vessel. Ectasia in the midsegment. 30% stenosis in the midsegment. Mild irregularities proximally and distally. No focal angiographic or obstructive stenosis. Posterior Descending Artery: Mild irregularities Posterolateral ventricular branch: Moderate-sized vessel. Mild irregularities  Most recent echocardiogram from 10/22/2022 with preserved ejection fraction, moderate diastolic dysfunction, no evidence of aortic valve stenosis, prosthetic aortic valve appears to be working well, RV systolic pressure was mildly elevated to 29  mmHg. EKG today shows sinus bradycardia, 57 bpm, no ischemia, first-degree AV block. Upon exertion-she briskly walks for 200 feet in the hallway, his heart rate increased to 74 bpm. At the end of walking, he felt fatigue and complaint of being winded.  Recent cardiac echo from November 11, 2022 revealed  normal LV systolic function with EF 65-67%,  normal LV wall motion,  grade 2 diastolic dysfunction, Bioprosthetic AV with normal function, mean gradient , peak . DI 0.56. Trace AI, moderate MAC, mild pulmonary hypertension with RVSP .  Echo 11/11/22 Left Ventricle: Systolic function is high normal. EF: 65-67%.    Left Ventricle: Wall motion is normal.    Left Ventricle: Doppler parameters consistent with moderate diastolic  dysfunction and elevated LA pressure.    Mitral Valve: There is moderate annular calcification.    Aortic Valve: Trace aortic valve regurgitation.    Aortic Valve: There is no evidence of aortic valve stenosis. Peak  velocity 2.3 m/sec. Peak gradient 21 mmHg. DI 0.56.    Aortic Valve: The aortic valve is tricuspid. The leaflets are slightly  thickened and exhibit normal excursion.    Aortic Valve: There is a bioprosthetic valve.    Aortic Valve: The prosthetic valve appears to be well-seated without  rocking and appears to be functioning normally. The prosthetic valve  function is normal, with peak and mean gradients of 22.000 and 11.000  mmHg.   Aortic Valve: The aortic valve was not well visualized.    Tricuspid Valve: The right ventricular systolic pressure is mildly  elevated - about 39 mmHg.   Nuclear stress test  from July, 2021 revealed no evidence of ischemia or scar. Normal left ventricular systolic function with LV EF 78%.  Normal wall motion. Normal LV size. It was low risk stress test.  Cardiac cath from Jan, 2020 - LM: Normal LAD: 20 to 30% wall regularities First diagonal: Normal Second diagonal: 90% ostial Lcx/OM: 30% wall  irregularities RCA/PDA/PLB: Dominant normal. Unsuccessful attempt at PCI of the second diagonal. Recommended: Continue medical management. Dual antiplatelet therapy. Consider attempt at opening the diagonal if angina persists but try to the leg.  Cardiac catheterization 2020 Conclusion:  Single-vessel obstructive CAD with an occlusion of the second diagonal which is a small to moderate sized vessel and uncertain whether this is actually causing most of his angina History of aortic valve replacement with a bioprosthetic bioprosthetic valve History of ascending aorta repair Unsuccessful attempt at PCI of the second diagonal   Laboratory Data:  High Sensitivity Troponin:   Recent Labs  Lab 02/27/23 0920 02/27/23 1210 02/27/23 1949 02/27/23 2227  TROPONINIHS 45* 50* 101* 91*     Chemistry Recent Labs  Lab 02/27/23 0920 02/27/23 1949 02/28/23 0306  NA 128*  --  132*  K 4.0  --  4.0  CL 98  --  103  CO2 20*  --  21*  GLUCOSE 138*  --  105*  BUN 15  --  15  CREATININE 0.83  --  0.94  CALCIUM 8.4*  --  7.9*  MG  --  1.8  --   GFRNONAA >60  --  >60  ANIONGAP 10  --  8    Recent Labs  Lab 02/27/23 0920 02/28/23 0306  PROT 6.2* 5.1*  ALBUMIN 3.2* 2.5*  AST 28 30  ALT 39 39  ALKPHOS 59 46  BILITOT 1.4* 1.0   Lipids No results for input(s): "CHOL", "TRIG", "HDL", "LABVLDL", "LDLCALC", "CHOLHDL" in the last 168 hours.  Hematology Recent Labs  Lab 02/27/23 0920 02/28/23 0306  WBC 11.7* 8.6  RBC 3.30* 2.87*  HGB 11.2* 9.7*  HCT 32.0* 28.0*  MCV 97.0 97.6  MCH 33.9 33.8  MCHC 35.0 34.6  RDW 11.8 11.9  PLT 134* 129*   Thyroid No results for input(s): "TSH", "FREET4" in the last 168 hours.  BNP Recent Labs  Lab 02/27/23 0920  BNP 744.7*    DDimer No results for input(s): "DDIMER" in the last 168 hours.   Radiology/Studies:  CT Angio Chest PE W and/or Wo Contrast  Result Date: 02/27/2023 CLINICAL DATA:  Pulmonary embolism (PE) suspected, high prob EXAM:  CT ANGIOGRAPHY CHEST WITH CONTRAST TECHNIQUE: Multidetector CT imaging of the chest was performed using the standard protocol during bolus administration of intravenous contrast. Multiplanar CT image reconstructions and MIPs were obtained to evaluate the vascular anatomy. RADIATION DOSE REDUCTION: This exam was performed according to the departmental dose-optimization program which includes automated exposure control, adjustment of the mA and/or kV according to patient size and/or use of iterative reconstruction technique. CONTRAST:  75mL OMNIPAQUE IOHEXOL 350 MG/ML SOLN COMPARISON:  X-ray 02/27/2023 FINDINGS: Cardiovascular: Satisfactory opacification of the pulmonary arteries to the segmental level. No evidence of pulmonary embolism. Thoracic aorta is nonaneurysmal. Aortic valve replacement. Scattered atherosclerotic vascular calcifications of the aorta and coronary arteries. Normal heart size. No pericardial effusion. Mediastinum/Nodes: Mildly prominent mediastinal lymph nodes including 1.1 cm precarinal node (series 4, image 56) and 1.3 cm AP window node (series 4, image 50). Suspect mildly prominent bilateral hilar lymph nodes, evaluation of which is limited in the presence of  dense perihilar opacities. No axillary lymphadenopathy. Thyroid gland, trachea, and esophagus demonstrate no significant abnormality. Lungs/Pleura: Moderate layering bilateral pleural effusions with compressive atelectasis of the bilateral lower lobes. Dense airspace consolidations within the bilateral perihilar regions and within the right upper lobe. Surrounding ground-glass opacity. Diffuse interlobular septal thickening. No pneumothorax. Upper Abdomen: No acute abnormality. Musculoskeletal: Prior sternotomy. No acute bony abnormality. No chest wall abnormality. Review of the MIP images confirms the above findings. IMPRESSION: 1. No evidence of pulmonary embolism. 2. Dense airspace consolidations within the bilateral perihilar regions  and within the right upper lobe with surrounding ground-glass opacity. Findings are favored to represent pulmonary edema. Superimposed pneumonia would be difficult to exclude. 3. Moderate layering bilateral pleural effusions with compressive atelectasis of the bilateral lower lobes. 4. Mildly prominent mediastinal and hilar lymph nodes, likely reactive. 5. Aortic and coronary artery atherosclerosis (ICD10-I70.0). Electronically Signed   By: Duanne Guess D.O.   On: 02/27/2023 12:49   DG Chest 2 View  Result Date: 02/27/2023 CLINICAL DATA:  Shortness of breath and cough for 1 day. EXAM: CHEST - 2 VIEW COMPARISON:  01/19/2023 FINDINGS: Heart size remains within normal limits. Prior median sternotomy and aortic valve replacement noted. Bilateral central pulmonary airspace opacity is seen in a bat wing configuration which is new and suspicious for acute pulmonary edema, with infection considered less likely. Tiny right pleural effusion also noted. IMPRESSION: New bilateral central pulmonary airspace opacity in bat wing configuration, suspicious for acute pulmonary edema, with infection considered less likely. Tiny right pleural effusion. Electronically Signed   By: Danae Orleans M.D.   On: 02/27/2023 09:32     Assessment and Plan:   Angina with elevated troponin to 101, with known disease of 1st diagonal with failed PCI in past.  Most likely demand ischemia.  Echo has been ordered last one in 10/2022 with normal EF and stable AV..  If drop in EF would recommend repeat cath though last one was last month.  Overall he feels better.   His primary complaint was SOB on admit Acute Respiratory failure with hypoxia in combination with pulmonary edema and possible PNA. Is on ABX.  Pt has had dyspnea for a 2 months and cardiac cath did not point to CAD with actual improvement in CAD.  He was for pulmonary function studies.  May need pulmonary consult. Hx AVR in 2008, last echo 10/2022 with stable AV,  echo is ordered  to re-evaluate Hyponatremia - drinks a lot of water daily,  was to see endocrinology in future to eval for diabetes insipidus  Na up to 132 today Hypersomnia with sleep apnea BIPAP at hs HTN  from notes was on coreg,  intolerant to ACE/ARB and amlodipine - can only dake hydralazine BID not TID Palpitations was to wear a monitor as outpt. Thrombocytopenia with hx of this   HLD on lipitor as outpt with last lipid panel 08/2022 T chol 129 TG 88 HDL 53 LDL 59    Risk Assessment/Risk Scores:        New York Heart Association (NYHA) Functional Class NYHA Class III        For questions or updates, please contact [facility] Please consult [email or web address] for contact info under    Signed, Nada Boozer, NP  02/28/2023 9:27 AM

## 2023-03-01 ENCOUNTER — Inpatient Hospital Stay (HOSPITAL_COMMUNITY): Payer: Medicare HMO

## 2023-03-01 DIAGNOSIS — I351 Nonrheumatic aortic (valve) insufficiency: Secondary | ICD-10-CM | POA: Insufficient documentation

## 2023-03-01 DIAGNOSIS — Z952 Presence of prosthetic heart valve: Secondary | ICD-10-CM | POA: Diagnosis not present

## 2023-03-01 DIAGNOSIS — J9601 Acute respiratory failure with hypoxia: Secondary | ICD-10-CM | POA: Diagnosis not present

## 2023-03-01 LAB — RESPIRATORY PANEL BY PCR

## 2023-03-01 LAB — CBC
HCT: 29 % — ABNORMAL LOW (ref 39.0–52.0)
Hemoglobin: 10 g/dL — ABNORMAL LOW (ref 13.0–17.0)
MCH: 33.7 pg (ref 26.0–34.0)
MCHC: 34.5 g/dL (ref 30.0–36.0)
MCV: 97.6 fL (ref 80.0–100.0)
Platelets: 140 10*3/uL — ABNORMAL LOW (ref 150–400)
RBC: 2.97 MIL/uL — ABNORMAL LOW (ref 4.22–5.81)
RDW: 11.9 % (ref 11.5–15.5)
WBC: 8.3 10*3/uL (ref 4.0–10.5)
nRBC: 0 % (ref 0.0–0.2)

## 2023-03-01 LAB — BASIC METABOLIC PANEL
Anion gap: 9 (ref 5–15)
BUN: 24 mg/dL — ABNORMAL HIGH (ref 8–23)
CO2: 25 mmol/L (ref 22–32)
Calcium: 8.2 mg/dL — ABNORMAL LOW (ref 8.9–10.3)
Chloride: 100 mmol/L (ref 98–111)
Creatinine, Ser: 1.13 mg/dL (ref 0.61–1.24)
GFR, Estimated: >60 mL/min (ref >60)
Glucose, Bld: 115 mg/dL — ABNORMAL HIGH (ref 70–99)
Potassium: 3.5 mmol/L (ref 3.5–5.1)
Sodium: 134 mmol/L — ABNORMAL LOW (ref 135–145)

## 2023-03-01 LAB — CULTURE, BLOOD (ROUTINE X 2)

## 2023-03-01 LAB — GLUCOSE, CAPILLARY: Glucose-Capillary: 115 mg/dL — ABNORMAL HIGH (ref 70–99)

## 2023-03-01 MED ORDER — ISOSORBIDE MONONITRATE ER 30 MG PO TB24
15.0000 mg | ORAL_TABLET | Freq: Every day | ORAL | Status: DC
  Administered 2023-03-01 – 2023-03-05 (×5): 15 mg via ORAL
  Filled 2023-03-01 (×5): qty 1

## 2023-03-01 MED ORDER — POLYETHYLENE GLYCOL 3350 17 G PO PACK: 17.0000 g | PACK | Freq: Every day | ORAL | Status: DC | PRN

## 2023-03-01 MED ORDER — ORAL CARE MOUTH RINSE: 15.0000 mL | OROMUCOSAL | Status: DC | PRN

## 2023-03-01 MED ORDER — TRAZODONE HCL 50 MG PO TABS
25.0000 mg | ORAL_TABLET | Freq: Every day | ORAL | Status: DC
  Administered 2023-03-01 – 2023-03-04 (×4): 25 mg via ORAL
  Filled 2023-03-01 (×4): qty 1

## 2023-03-01 MED ORDER — ORAL CARE MOUTH RINSE
15.0000 mL | OROMUCOSAL | Status: DC
  Administered 2023-03-01 – 2023-03-05 (×14): 15 mL via OROMUCOSAL

## 2023-03-01 MED ORDER — DOCUSATE SODIUM 100 MG PO CAPS: 100.0000 mg | ORAL_CAPSULE | Freq: Two times a day (BID) | ORAL | Status: DC | PRN

## 2023-03-01 NOTE — Progress Notes (Signed)
   03/01/23 0000  BiPAP/CPAP/SIPAP  $ Non-Invasive Ventilator  Non-Invasive Vent Subsequent  BiPAP/CPAP /SiPAP Vitals  Pulse Rate 68  Resp 20  BP (!) 110/24  SpO2 98 %  MEWS Score/Color  MEWS Score 0  MEWS Score Color Randy Duncan

## 2023-03-01 NOTE — Evaluation (Signed)
Physical Therapy Evaluation Patient Details Name: Randy Duncan MRN: [id number] DOB: 1946/04/03 Today's Date: 03/01/2023  History of Present Illness  WING GAINES is a 77 y.o. male followed by Dr. Fransisco Beau with [facility] with a hx of CAD, DM2, hx of bioprosthetic aortic valve repair in 2008, hx of ascending aorta repair, centrilobular emphysema, celiac disease, hyperglycemia, sleep disorder, pancytopenia who presented to the emergency department 02/27/23 with complaints of fever of 101 F at home, dark sputum productive cough, chest pain and dyspnea.2 view chest radiograph showing new bilateral central pulmonary airspace opacity batwing configuration suspicious for acute pulmonary edema  Clinical Impression  Pt admitted with above diagnosis. Pt ambulated 200' with RW, no loss of balance, SpO2 94% on room air walking. At baseline pt ambulates independently and plays golf 3x/week. He is mobilizing well enough to DC home from a PT standpoint.  Pt currently with functional limitations due to the deficits listed below (see PT Problem List). Pt will benefit from acute skilled PT to increase their independence and safety with mobility to allow discharge.          Recommendations for follow up therapy are one component of a multi-disciplinary discharge planning process, led by the attending physician.  Recommendations may be updated based on patient status, additional functional criteria and insurance authorization.  Follow Up Recommendations       Assistance Recommended at Discharge Set up Supervision/Assistance  Patient can return home with the following  Assistance with cooking/housework    Equipment Recommendations None recommended by PT  Recommendations for Other Services       Functional Status Assessment Patient has had a recent decline in their functional status and demonstrates the ability to make significant improvements in function in a reasonable and predictable amount of  time.     Precautions / Restrictions Precautions Precautions: None Restrictions Weight Bearing Restrictions: No      Mobility  Bed Mobility Overal bed mobility: Modified Independent             General bed mobility comments: HOB up, used rail    Transfers Overall transfer level: Needs assistance Equipment used: Rolling walker (2 wheels) Transfers: Sit to/from Stand Sit to Stand: Supervision           General transfer comment: VCs hand placement    Ambulation/Gait Ambulation/Gait assistance: Supervision Gait Distance (Feet): 200 Feet Assistive device: Rolling walker (2 wheels) Gait Pattern/deviations: WFL(Within Functional Limits) Gait velocity: WFL     General Gait Details: steady with RW, no loss of balance, SpO2 94% on room air, no dyspnea noted  Stairs            Wheelchair Mobility    Modified Rankin (Stroke Patients Only)       Balance Overall balance assessment: Modified Independent                                           Pertinent Vitals/Pain Pain Assessment Pain Assessment: No/denies pain    Home Living Family/patient expects to be discharged to:: Private residence Living Arrangements: Spouse/significant other Available Help at Discharge: Family;Available 24 hours/day Type of Home: Mobile home Home Access: Stairs to enter   Entrance Stairs-Number of Steps: 2   Home Layout: One level Home Equipment: Agricultural consultant (2 wheels);Cane - single point      Prior Function Prior Level of Function : Independent/Modified Independent  Mobility Comments: walks independently, denies falls in past 6 months, plays golf 3x/week ADLs Comments: independent     Hand Dominance        Extremity/Trunk Assessment   Upper Extremity Assessment Upper Extremity Assessment: Overall WFL for tasks assessed    Lower Extremity Assessment Lower Extremity Assessment: Overall WFL for tasks assessed    Cervical  / Trunk Assessment Cervical / Trunk Assessment: Normal  Communication   Communication: No difficulties  Cognition Arousal/Alertness: Awake/alert Behavior During Therapy: WFL for tasks assessed/performed Overall Cognitive Status: Within Functional Limits for tasks assessed                                          General Comments      Exercises     Assessment/Plan    PT Assessment Patient needs continued PT services  PT Problem List Decreased activity tolerance       PT Treatment Interventions DME instruction;Functional mobility training;Gait training;Therapeutic activities;Therapeutic exercise    PT Goals (Current goals can be found in the Care Plan section)  Acute Rehab PT Goals Patient Stated Goal: return to playing golf PT Goal Formulation: With patient Time For Goal Achievement: 03/15/23 Potential to Achieve Goals: Good    Frequency Min 1X/week     Co-evaluation               AM-PAC PT "6 Clicks" Mobility  Outcome Measure Help needed turning from your back to your side while in a flat bed without using bedrails?: A Little Help needed moving from lying on your back to sitting on the side of a flat bed without using bedrails?: A Little Help needed moving to and from a bed to a chair (including a wheelchair)?: None Help needed standing up from a chair using your arms (e.g., wheelchair or bedside chair)?: None Help needed to walk in hospital room?: None Help needed climbing 3-5 steps with a railing? : A Little 6 Click Score: 21    End of Session Equipment Utilized During Treatment: Gait belt Activity Tolerance: Patient tolerated treatment well Patient left: in chair;with call bell/phone within reach;with family/visitor present Nurse Communication: Mobility status PT Visit Diagnosis: Difficulty in walking, not elsewhere classified (R26.2)    Time: 2536-6440 PT Time Calculation (min) (ACUTE ONLY): 32 min   Charges:   PT Evaluation $PT  Eval Moderate Complexity: 1 Mod PT Treatments $Gait Training: 8-22 mins       Ralene Bathe Kistler PT 03/01/2023  Acute Rehabilitation Services  Office [phone]

## 2023-03-01 NOTE — Progress Notes (Signed)
   02/28/23 2340  BiPAP/CPAP/SIPAP  BiPAP/CPAP/SIPAP Pt Type Adult  BiPAP/CPAP/SIPAP V60  Mask Type Full face mask  Mask Size Large  Set Rate 12 breaths/min  Respiratory Rate 22 breaths/min  IPAP 10 cmH20  EPAP 5 cmH2O  FiO2 (%) 28 %  Flow Rate 1 lpm  Minute Ventilation 13.5  Leak 20  Peak Inspiratory Pressure (PIP) 10  Tidal Volume (Vt) 653  Patient Home Equipment No  Auto Titrate No  Press High Alarm 35 cmH2O  Press Low Alarm 5 cmH2O  Nasal massage performed  (Just re-applied)  CPAP/SIPAP surface wiped down Yes

## 2023-03-01 NOTE — Progress Notes (Signed)
Pt not on BIPAP at this time.  

## 2023-03-01 NOTE — Progress Notes (Signed)
PROGRESS NOTE    Randy Duncan  WUJ:[id number] DOB: 1945-11-02 DOA: 02/27/2023 PCP: Tracey Harries, MD    Brief Narrative:  Randy Duncan is a 77 y.o. male with medical history significant of celiac disease, fatigue, hypersomnia, hyperglycemia, impaired fasting glucose, sleep disorder, emphysema, hypertension, CAD, abnormal nuclear stress test, aortic valve replacement, PVCs, pancytopenia who presented to the emergency department complaints of fever of 101 F at home, dark sputum productive cough, chest pain and dyspnea since yesterday.  Per patient, he had sudden onset of chest tightness and dyspnea last month while playing golf and underwent a nuclear stress test followed by a cardiac catheterization.  He also stated that for the past 2-year he has been having dry mouth and feels like he is always dehydrated.  He is scheduled to see endocrinology for possible diabetes insipidus.  He denied rhinorrhea, sore throat, wheezing or hemoptysis.  No palpitations, diaphoresis, but positive nocturia, PND, orthopnea and pitting edema of the lower extremities.  No abdominal pain, nausea, emesis, diarrhea, melena or hematochezia.  No flank pain, dysuria, frequency or hematuria.    Assessment and Plan:   Acute respiratory failure with hypoxia (HCC) In the setting of:   Pulmonary edema Supplemental oxygen as needed wean to off-- not on O2 at home - furosemide 40 mg IVP twice daily. Monitor daily weights, intake and output. Monitor renal function electrolytes. Cardiology consulted -procal negative-- will d/c abx, NP swab negative, COVID negative  -echo:  mild AS and severe AI over bioprosthetic AVR. Suggest TEE to further evalaute. -- defer to cards -recent heart cath for abnormal stress test (follows at [facility])    Elevated troponin In the the setting of history of   Coronary artery disease involving native  coronary artery of native heart without angina pectoris Continue low-dose aspirin and  beta-blocker. Continue ranolazine 1000 mg p.o. twice daily. Continue atorvastatin 80 mg p.o. at.      Hyponatremia -improved with IV lasix- continue     Hypersomnia with sleep apnea BiPAP at bedtime. -add sleeping medicine     Essential hypertension Continue hydralazine 25 mg p.o. twice daily.--  per cards     Pressure injury of skin Pressure Injury 02/27/23 Sacrum Stage 1 -  Intact skin with non-blanchable redness of a localized area usually over a bony prominence. (Active)  02/27/23 1739  Location: Sacrum  Location Orientation:   Staging: Stage 1 -  Intact skin with non-blanchable redness of a localized area usually over a bony prominence.  Wound Description (Comments):   Present on Admission: Yes         Normocytic anemia Monitor hematocrit and hemoglobin.     Thrombocytopenia (HCC) Monitor platelet count.       Mild protein malnutrition (HCC) May be due to hemodilution. Protein supplementation as needed.    PT eval-- family concerned about patient's ability to care for self at home    DVT prophylaxis: SCDs Start: 02/27/23 1621    Code Status: Full Code Family Communication: wife at bedside  Disposition Plan:  Level of care: Stepdown Status is: Inpatient Remains inpatient appropriate     Consultants:  cards   Subjective: Did not sleep well overnight and is upset about it  Objective: Vitals:   03/01/23 0800 03/01/23 0900 03/01/23 1000 03/01/23 1100  BP: (!) 128/42 (!) 143/32 (!) 120/26 (!) 143/35  Pulse: 77 79 73 81  Resp: (!) 25 18 (!) 23 (!) 23  Temp: (!) 97.2 F (36.2 C)  TempSrc: Axillary     SpO2: 98% 97% 95% 98%  Weight:      Height:        Intake/Output Summary (Last 24 hours) at 03/01/2023 1121 Last data filed at 03/01/2023 1000 Gross per 24 hour  Intake 522.14 ml  Output 3625 ml  Net -3102.86 ml   Filed Weights   02/27/23 0857 02/28/23 0600 03/01/23 0500  Weight: 71.2 kg 72.8 kg 73.3 kg    Examination:    General:  Appearance:     Overweight male in no acute distress     Lungs:     respirations unlabored  Heart:    Normal heart rate. + murmur  MS:   All extremities are intact.   Neurologic:   Awake, alert- upset       Data Reviewed: I have personally reviewed following labs and imaging studies  CBC: Recent Labs  Lab 02/27/23 0920 02/28/23 0306 03/01/23 0306  WBC 11.7* 8.6 8.3  NEUTROABS 9.4*  --   --   HGB 11.2* 9.7* 10.0*  HCT 32.0* 28.0* 29.0*  MCV 97.0 97.6 97.6  PLT 134* 129* 140*   Basic Metabolic Panel: Recent Labs  Lab 02/27/23 0920 02/27/23 1949 02/28/23 0306 03/01/23 0306  NA 128*  --  132* 134*  K 4.0  --  4.0 3.5  CL 98  --  103 100  CO2 20*  --  21* 25  GLUCOSE 138*  --  105* 115*  BUN 15  --  15 24*  CREATININE 0.83  --  0.94 1.13  CALCIUM 8.4*  --  7.9* 8.2*  MG  --  1.8  --   --   PHOS  --   --  4.2  --    GFR: Estimated Creatinine Clearance: 51.2 mL/min (by C-G formula based on SCr of 1.13 mg/dL). Liver Function Tests: Recent Labs  Lab 02/27/23 0920 02/28/23 0306  AST 28 30  ALT 39 39  ALKPHOS 59 46  BILITOT 1.4* 1.0  PROT 6.2* 5.1*  ALBUMIN 3.2* 2.5*   No results for input(s): "LIPASE", "AMYLASE" in the last 168 hours. No results for input(s): "AMMONIA" in the last 168 hours. Coagulation Profile: No results for input(s): "INR", "PROTIME" in the last 168 hours. Cardiac Enzymes: No results for input(s): "CKTOTAL", "CKMB", "CKMBINDEX", "TROPONINI" in the last 168 hours. BNP (last 3 results) No results for input(s): "PROBNP" in the last 8760 hours. HbA1C: Recent Labs    02/28/23 1002  HGBA1C 5.3   CBG: Recent Labs  Lab 03/01/23 0758  GLUCAP 115*   Lipid Profile: No results for input(s): "CHOL", "HDL", "LDLCALC", "TRIG", "CHOLHDL", "LDLDIRECT" in the last 72 hours. Thyroid Function Tests: Recent Labs    02/28/23 1002  TSH 1.442  FREET4 1.54*   Anemia Panel: No results for input(s): "VITAMINB12", "FOLATE", "FERRITIN", "TIBC",  "IRON", "RETICCTPCT" in the last 72 hours. Sepsis Labs: Recent Labs  Lab 02/27/23 1005 02/27/23 1210 02/27/23 1213 02/28/23 0306  PROCALCITON  --   --  <0.10 <0.10  LATICACIDVEN 0.9 0.8  --   --     Recent Results (from the past 240 hour(s))  Blood culture (routine x 2)     Status: None (Preliminary result)   Collection Time: 02/27/23 10:05 AM   Specimen: Right Antecubital; Blood  Result Value Ref Range Status   Specimen Description   Final    RIGHT ANTECUBITAL BLOOD Performed at [facility], [address], [place], [place] [address]  Special Requests   Final    BOTTLES DRAWN AEROBIC AND ANAEROBIC Blood Culture adequate volume Performed at [facility], [address], [place], [place] [address]    Culture   Final    NO GROWTH 2 DAYS Performed at [facility], [address], [place], [place] [address]    Report Status PENDING  Incomplete  Blood culture (routine x 2)     Status: None (Preliminary result)   Collection Time: 02/27/23 10:06 AM   Specimen: BLOOD LEFT ARM  Result Value Ref Range Status   Specimen Description   Final    BLOOD LEFT ARM Performed at [facility], [address] [place], [place] [address]    Special Requests   Final    BOTTLES DRAWN AEROBIC AND ANAEROBIC Blood Culture adequate volume Performed at [facility], [address], [place], [place] [address]    Culture   Final    NO GROWTH 2 DAYS Performed at [facility], [address] [place], [place] [address]    Report Status PENDING  Incomplete  Resp panel by RT-PCR (RSV, Flu A&B, Covid) Anterior Nasal Swab     Status: None   Collection Time: 02/27/23 12:43 PM   Specimen: Anterior Nasal Swab  Result Value Ref Range Status   SARS Coronavirus 2 by RT PCR NEGATIVE NEGATIVE Final    Comment: (NOTE) SARS-CoV-2 target nucleic acids are NOT DETECTED.  The SARS-CoV-2 RNA is generally detectable in upper  respiratory specimens during the acute phase of infection. The lowest concentration of SARS-CoV-2 viral copies this assay can detect is 138 copies/mL. A negative result does not preclude SARS-Cov-2 infection and should not be used as the sole basis for treatment or other patient management decisions. A negative result may occur with  improper specimen collection/handling, submission of specimen other than nasopharyngeal swab, presence of viral mutation(s) within the areas targeted by this assay, and inadequate number of viral copies(<138 copies/mL). A negative result must be combined with clinical observations, patient history, and epidemiological information. The expected result is Negative.  Fact Sheet for Patients:  BloggerCourse.com  Fact Sheet for Healthcare Providers:  SeriousBroker.it  This test is no t yet approved or cleared by the [place] FDA and  has been authorized for detection and/or diagnosis of SARS-CoV-2 by FDA under an Emergency Use Authorization (EUA). This EUA will remain  in effect (meaning this test can be used) for the duration of the COVID-19 declaration under Section 564(b)(1) of the Act, 21 U.S.C.section 360bbb-3(b)(1), unless the authorization is terminated  or revoked sooner.       Influenza A by PCR NEGATIVE NEGATIVE Final   Influenza B by PCR NEGATIVE NEGATIVE Final    Comment: (NOTE) The Xpert Xpress SARS-CoV-2/FLU/RSV plus assay is intended as an aid in the diagnosis of influenza from Nasopharyngeal swab specimens and should not be used as a sole basis for treatment. Nasal washings and aspirates are unacceptable for Xpert Xpress SARS-CoV-2/FLU/RSV testing.  Fact Sheet for Patients: BloggerCourse.com  Fact Sheet for Healthcare Providers: SeriousBroker.it  This test is not yet approved or cleared by the [place] FDA and has been  authorized for detection and/or diagnosis of SARS-CoV-2 by FDA under an Emergency Use Authorization (EUA). This EUA will remain in effect (meaning this test can be used) for the duration of the COVID-19 declaration under Section 564(b)(1) of the Act, 21 U.S.C. section 360bbb-3(b)(1), unless the authorization is terminated or revoked.  Resp Syncytial Virus by PCR NEGATIVE NEGATIVE Final    Comment: (NOTE) Fact Sheet for Patients: BloggerCourse.com  Fact Sheet for Healthcare Providers: SeriousBroker.it  This test is not yet approved or cleared by the [place] FDA and has been authorized for detection and/or diagnosis of SARS-CoV-2 by FDA under an Emergency Use Authorization (EUA). This EUA will remain in effect (meaning this test can be used) for the duration of the COVID-19 declaration under Section 564(b)(1) of the Act, 21 U.S.C. section 360bbb-3(b)(1), unless the authorization is terminated or revoked.  Performed at [facility], [address] [place], [place] [address]   Respiratory (~20 pathogens) panel by PCR     Status: None   Collection Time: 02/27/23 12:43 PM   Specimen: Nasopharyngeal Swab; Respiratory  Result Value Ref Range Status   Adenovirus NOT DETECTED NOT DETECTED Final   Coronavirus 229E NOT DETECTED NOT DETECTED Final    Comment: (NOTE) The Coronavirus on the Respiratory Panel, DOES NOT test for the novel  Coronavirus (2019 nCoV)    Coronavirus HKU1 NOT DETECTED NOT DETECTED Final   Coronavirus NL63 NOT DETECTED NOT DETECTED Final   Coronavirus OC43 NOT DETECTED NOT DETECTED Final   Metapneumovirus NOT DETECTED NOT DETECTED Final   Rhinovirus / Enterovirus NOT DETECTED NOT DETECTED Final   Influenza A NOT DETECTED NOT DETECTED Final   Influenza B NOT DETECTED NOT DETECTED Final   Parainfluenza Virus 1 NOT DETECTED NOT DETECTED Final   Parainfluenza Virus 2 NOT DETECTED NOT  DETECTED Final   Parainfluenza Virus 3 NOT DETECTED NOT DETECTED Final   Parainfluenza Virus 4 NOT DETECTED NOT DETECTED Final   Respiratory Syncytial Virus NOT DETECTED NOT DETECTED Final   Bordetella pertussis NOT DETECTED NOT DETECTED Final   Bordetella Parapertussis NOT DETECTED NOT DETECTED Final   Chlamydophila pneumoniae NOT DETECTED NOT DETECTED Final   Mycoplasma pneumoniae NOT DETECTED NOT DETECTED Final    Comment: Performed at [facility], [address], [place], [place] [address]  MRSA Next Gen by PCR, Nasal     Status: None   Collection Time: 02/28/23  8:57 AM   Specimen: Nasal Mucosa; Nasal Swab  Result Value Ref Range Status   MRSA by PCR Next Gen NOT DETECTED NOT DETECTED Final    Comment: (NOTE) The GeneXpert MRSA Assay (FDA approved for NASAL specimens only), is one component of a comprehensive MRSA colonization surveillance program. It is not intended to diagnose MRSA infection nor to guide or monitor treatment for MRSA infections. Test performance is not FDA approved in patients less than 59 years old. Performed at [facility], [address] [place], [place] [address]          Radiology Studies: ECHOCARDIOGRAM COMPLETE  Result Date: 02/28/2023    ECHOCARDIOGRAM REPORT   Patient Name:   Randy Duncan Date of Exam: 02/28/2023 Medical Rec #:  [id number]        Height:       67.0 in Accession #:    [id number]       Weight:       160.5 lb Date of Birth:  [birth year]       BSA:          1.842 m Patient Age:    76 years         BP:           127/17 mmHg Patient Gender: M  HR:           83 bpm. Exam Location:  Inpatient Procedure: 2D Echo, Cardiac Doppler and Color Doppler Indications:    CHF Diastolic  History:        Patient has prior history of Echocardiogram examinations. CAD,                 Signs/Symptoms:Fatigue; Risk Factors:Hypertension.                 Aortic Valve: 21 mm Magna pericardial valve is present in the                  aortic position. Procedure Date: 11/2006.  Sonographer:    Milda Smart Referring Phys: [id number] Dond[address] Aime U Heydy Montilla IMPRESSIONS  1. Left ventricular ejection fraction, by estimation, is 60 to 65%. The left ventricle has normal function. The left ventricle has no regional wall motion abnormalities. Left ventricular diastolic parameters are indeterminate.  2. Right ventricular systolic function is normal. The right ventricular size is mildly enlarged.  3. Left atrial size was mild to moderately dilated.  4. Right atrial size was moderately dilated.  5. The mitral valve is degenerative. Mild mitral valve regurgitation. No evidence of mitral stenosis. Moderate mitral annular calcification.  6. Tricuspid valve regurgitation is mild to moderate.  7. The aortic valve has been repaired/replaced. Aortic valve regurgitation is severe. Mild aortic valve stenosis. There is a 21 mm Magna pericardial valve present in the aortic position. Procedure Date: 11/2006. Aortic regurgitation PHT measures 147 msec. Aortic valve area, by VTI measures 1.46 cm. Aortic valve mean gradient measures 20.0 mmHg. Aortic valve Vmax measures 3.08 m/s.  8. The inferior vena cava is dilated in size with <50% respiratory variability, suggesting right atrial pressure of 15 mmHg. Conclusion(s)/Recommendation(s): There is mild AS and severe AI over bioprosthetic AVR. Suggest TEE to further evalaute. FINDINGS  Left Ventricle: Left ventricular ejection fraction, by estimation, is 60 to 65%. The left ventricle has normal function. The left ventricle has no regional wall motion abnormalities. The left ventricular internal cavity size was normal in size. There is  no left ventricular hypertrophy. Left ventricular diastolic parameters are indeterminate. Right Ventricle: The right ventricular size is mildly enlarged. No increase in right ventricular wall thickness. Right ventricular systolic function is normal. Left Atrium: Left atrial size was mild to  moderately dilated. Right Atrium: Right atrial size was moderately dilated. Pericardium: There is no evidence of pericardial effusion. Mitral Valve: The mitral valve is degenerative in appearance. Moderate mitral annular calcification. Mild mitral valve regurgitation. No evidence of mitral valve stenosis. Tricuspid Valve: The tricuspid valve is normal in structure. Tricuspid valve regurgitation is mild to moderate. No evidence of tricuspid stenosis. Aortic Valve: The aortic valve has been repaired/replaced. Aortic valve regurgitation is severe. Aortic regurgitation PHT measures 147 msec. Mild aortic stenosis is present. Aortic valve mean gradient measures 20.0 mmHg. Aortic valve peak gradient measures 37.9 mmHg. Aortic valve area, by VTI measures 1.46 cm. There is a 21 mm Magna pericardial valve present in the aortic position. Procedure Date: 11/2006. Pulmonic Valve: The pulmonic valve was normal in structure. Pulmonic valve regurgitation is trivial. No evidence of pulmonic stenosis. Aorta: The aortic root is normal in size and structure. Venous: The inferior vena cava is dilated in size with less than 50% respiratory variability, suggesting right atrial pressure of 15 mmHg. IAS/Shunts: No atrial level shunt detected by color flow Doppler. Additional Comments: There is pleural effusion in the left lateral  region.  LEFT VENTRICLE PLAX 2D LVIDd:         4.80 cm      Diastology LVIDs:         3.00 cm      LV e' medial:    6.20 cm/s LV PW:         0.80 cm      LV E/e' medial:  21.8 LV IVS:        0.80 cm      LV e' lateral:   4.90 cm/s LVOT diam:     1.90 cm      LV E/e' lateral: 27.6 LV SV:         93 LV SV Index:   50 LVOT Area:     2.84 cm  LV Volumes (MOD) LV vol d, MOD A2C: 91.7 ml LV vol d, MOD A4C: 100.0 ml LV vol s, MOD A2C: 32.8 ml LV vol s, MOD A4C: 32.6 ml LV SV MOD A2C:     58.9 ml LV SV MOD A4C:     100.0 ml LV SV MOD BP:      62.8 ml RIGHT VENTRICLE             IVC RV S prime:     12.10 cm/s  IVC diam:  2.00 cm TAPSE (M-mode): 1.9 cm LEFT ATRIUM             Index        RIGHT ATRIUM           Index LA diam:        3.80 cm 2.06 cm/m   RA Area:     16.80 cm LA Vol (A2C):   44.5 ml 24.16 ml/m  RA Volume:   47.40 ml  25.74 ml/m LA Vol (A4C):   39.2 ml 21.28 ml/m LA Biplane Vol: 43.8 ml 23.78 ml/m  AORTIC VALVE AV Area (Vmax):    1.53 cm AV Area (Vmean):   1.50 cm AV Area (VTI):     1.46 cm AV Vmax:           308.00 cm/s AV Vmean:          208.000 cm/s AV VTI:            0.635 m AV Peak Grad:      37.9 mmHg AV Mean Grad:      20.0 mmHg LVOT Vmax:         166.00 cm/s LVOT Vmean:        110.000 cm/s LVOT VTI:          0.328 m LVOT/AV VTI ratio: 0.52 AI PHT:            147 msec  AORTA Ao Root diam: 2.80 cm Ao Asc diam:  3.20 cm MITRAL VALVE                TRICUSPID VALVE MV Area (PHT): 4.29 cm     TR Peak grad:   51.3 mmHg MV Decel Time: 177 msec     TR Mean grad:   36.0 mmHg MR Peak grad: 118.8 mmHg    TR Vmax:        358.00 cm/s MR Mean grad: 75.5 mmHg     TR Vmean:       291.0 cm/s MR Vmax:      545.00 cm/s MR Vmean:     411.5 cm/s    SHUNTS MV E velocity: 135.00 cm/s  Systemic VTI:  0.33 m                             Systemic Diam: 1.90 cm Arvilla Meres MD Electronically signed by Arvilla Meres MD Signature Date/Time: 02/28/2023/4:51:21 PM    Final    CT Angio Chest PE W and/or Wo Contrast  Result Date: 02/27/2023 CLINICAL DATA:  Pulmonary embolism (PE) suspected, high prob EXAM: CT ANGIOGRAPHY CHEST WITH CONTRAST TECHNIQUE: Multidetector CT imaging of the chest was performed using the standard protocol during bolus administration of intravenous contrast. Multiplanar CT image reconstructions and MIPs were obtained to evaluate the vascular anatomy. RADIATION DOSE REDUCTION: This exam was performed according to the departmental dose-optimization program which includes automated exposure control, adjustment of the mA and/or kV according to patient size and/or use of iterative reconstruction technique.  CONTRAST:  75mL OMNIPAQUE IOHEXOL 350 MG/ML SOLN COMPARISON:  X-ray 02/27/2023 FINDINGS: Cardiovascular: Satisfactory opacification of the pulmonary arteries to the segmental level. No evidence of pulmonary embolism. Thoracic aorta is nonaneurysmal. Aortic valve replacement. Scattered atherosclerotic vascular calcifications of the aorta and coronary arteries. Normal heart size. No pericardial effusion. Mediastinum/Nodes: Mildly prominent mediastinal lymph nodes including 1.1 cm precarinal node (series 4, image 56) and 1.3 cm AP window node (series 4, image 50). Suspect mildly prominent bilateral hilar lymph nodes, evaluation of which is limited in the presence of dense perihilar opacities. No axillary lymphadenopathy. Thyroid gland, trachea, and esophagus demonstrate no significant abnormality. Lungs/Pleura: Moderate layering bilateral pleural effusions with compressive atelectasis of the bilateral lower lobes. Dense airspace consolidations within the bilateral perihilar regions and within the right upper lobe. Surrounding ground-glass opacity. Diffuse interlobular septal thickening. No pneumothorax. Upper Abdomen: No acute abnormality. Musculoskeletal: Prior sternotomy. No acute bony abnormality. No chest wall abnormality. Review of the MIP images confirms the above findings. IMPRESSION: 1. No evidence of pulmonary embolism. 2. Dense airspace consolidations within the bilateral perihilar regions and within the right upper lobe with surrounding ground-glass opacity. Findings are favored to represent pulmonary edema. Superimposed pneumonia would be difficult to exclude. 3. Moderate layering bilateral pleural effusions with compressive atelectasis of the bilateral lower lobes. 4. Mildly prominent mediastinal and hilar lymph nodes, likely reactive. 5. Aortic and coronary artery atherosclerosis (ICD10-I70.0). Electronically Signed   By: Duanne Guess D.O.   On: 02/27/2023 12:49        Scheduled Meds:   albuterol  2.5 mg Nebulization TID   aspirin EC  81 mg Oral Daily   atorvastatin  80 mg Oral QHS   Chlorhexidine Gluconate Cloth  6 each Topical Q2000   furosemide  40 mg Intravenous BID   hydrALAZINE  25 mg Oral BID   metoprolol succinate  12.5 mg Oral Daily   mouth rinse  15 mL Mouth Rinse 4 times per day   pantoprazole  40 mg Oral Daily   ranolazine  1,000 mg Oral BID   traZODone  25 mg Oral QHS   Continuous Infusions:  sodium chloride 10 mL/hr at 02/28/23 1807     LOS: 2 days    Time spent: 45 minutes spent on chart review, discussion with nursing staff, consultants, updating family and interview/physical exam; more than 50% of that time was spent in counseling and/or coordination of care.    Joseph Art, DO [facility] Available via Epic secure chat 7am-7pm After these hours, please refer to coverage provider listed on [email or web address] 03/01/2023, 11:21 AM

## 2023-03-01 NOTE — Progress Notes (Signed)
PT Cancellation Note  Patient Details Name: HAVIS MCSWEENEY MRN: [id number] DOB: [birth year]   Cancelled Treatment:    Reason Eval/Treat Not Completed: Fatigue/lethargy limiting ability to participate (Pt's wife and son stated pt just fell asleep and requested he be allowed to rest as he slept poorly last night. Will check back later today.)   Tamala Ser PT 03/01/2023  Acute Rehabilitation Services  Office [phone]

## 2023-03-01 NOTE — Progress Notes (Signed)
Patient Name: Randy Duncan Date of Encounter: 03/01/2023 [facility] Cardiologist: None [facility]   Interval Summary  .    77 y.o. male followed by Dr. Fransisco Beau with [facility] with a hx of CAD, DM2, hx of bioprosthetic aortic valve repair in 2008, hx of ascending aorta repair, centrilobular emphysema, celiac disease, hyperglycemia, sleep disorder, pancytopenia now admitted with PNA and CHF and chest pain.    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 Suggest TEE to further evalaute.   No chest pain and no SOB Vital Signs .    Vitals:   03/01/23 0500 03/01/23 0600 03/01/23 0700 03/01/23 0800  BP: (!) 131/29 (!) 108/26 (!) 139/27 (!) 128/42  Pulse: 84 75 82 77  Resp: (!) 22 20 (!) 22 (!) 25  Temp:      TempSrc:      SpO2: 94% 96% 97% 98%  Weight: 73.3 kg     Height:        Intake/Output Summary (Last 24 hours) at 03/01/2023 0859 Last data filed at 03/01/2023 0823 Gross per 24 hour  Intake 672.14 ml  Output 3525 ml  Net -2852.86 ml      03/01/2023    5:00 AM 02/28/2023    6:00 AM 02/27/2023    8:57 AM  Last 3 Weights  Weight (lbs) 161 lb 9.6 oz 160 lb 7.9 oz 157 lb  Weight (kg) 73.3 kg 72.8 kg 71.215 kg      Telemetry/ECG    SR - Personally Reviewed No new EKGs   ECHO 02/28/23 IMPRESSIONS     1. Left ventricular ejection fraction, by estimation, is 60 to 65%. The  left ventricle has normal function. The left ventricle has no regional  wall motion abnormalities. Left ventricular diastolic parameters are  indeterminate.   2. Right ventricular systolic function is normal. The right ventricular  size is mildly enlarged.   3. Left atrial size was mild to moderately dilated.   4. Right atrial size was moderately dilated.   5. The mitral valve is degenerative. Mild mitral valve regurgitation. No  evidence of mitral stenosis. Moderate mitral annular calcification.   6. Tricuspid valve regurgitation is mild to moderate.   7.  The aortic valve has been repaired/replaced. Aortic valve  regurgitation is severe. Mild aortic valve stenosis. There is a 21 mm  Magna pericardial valve present in the aortic position. Procedure Date:  11/2006. Aortic regurgitation PHT measures 147  msec. Aortic valve area, by VTI measures 1.46 cm. Aortic valve mean  gradient measures 20.0 mmHg. Aortic valve Vmax measures 3.08 m/s.   8. The inferior vena cava is dilated in size with <50% respiratory  variability, suggesting right atrial pressure of 15 mmHg.   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 Suggest TEE to further evalaute.   FINDINGS   Left Ventricle: Left ventricular ejection fraction, by estimation, is 60  to 65%. The left ventricle has normal function. The left ventricle has no  regional wall motion abnormalities. The left ventricular internal cavity  size was normal in size. There is   no left ventricular hypertrophy. Left ventricular diastolic parameters  are indeterminate.   Right Ventricle: The right ventricular size is mildly enlarged. No  increase in right ventricular wall thickness. Right ventricular systolic  function is normal.   Left Atrium: Left atrial size was mild to moderately dilated.   Right Atrium: Right atrial size was moderately dilated.  Pericardium: There is no evidence of pericardial effusion.   Mitral Valve: The mitral valve is degenerative in appearance. Moderate  mitral annular calcification. Mild mitral valve regurgitation. No evidence  of mitral valve stenosis.   Tricuspid Valve: The tricuspid valve is normal in structure. Tricuspid  valve regurgitation is mild to moderate. No evidence of tricuspid  stenosis.   Aortic Valve: The aortic valve has been repaired/replaced. Aortic valve  regurgitation is severe. Aortic regurgitation PHT measures 147 msec. Mild  aortic stenosis is present. Aortic valve mean gradient measures 20.0 mmHg.  Aortic  valve peak gradient  measures 37.9 mmHg. Aortic valve area, by VTI measures 1.46 cm. There is  a 21 mm Magna pericardial valve present in the aortic position. Procedure  Date: 11/2006.   Pulmonic Valve: The pulmonic valve was normal in structure. Pulmonic valve  regurgitation is trivial. No evidence of pulmonic stenosis.   Aorta: The aortic root is normal in size and structure.   Venous: The inferior vena cava is dilated in size with less than 50%  respiratory variability, suggesting right atrial pressure of 15 mmHg.   IAS/Shunts: No atrial level shunt detected by color flow Doppler.    Physical Exam .   GEN: No acute distress.   Neck: No JVD Cardiac: RRR, soft 2/6 systolic murmur, no rubs, or gallops.  Respiratory: Clear to auscultation bilaterally. GI: Soft, nontender, non-distended  MS: No edema  Assessment & Plan .     Angina with elevated troponin to 101, with known disease of 1st diagonal with failed PCI in past.  Most likely demand ischemia.  Echo has been ordered last one in 10/2022 with normal EF and stable AV..  If drop in EF would recommend repeat cath though last one was last month.  Overall he feels better.   His primary complaint was SOB on admit will defer to Dr. Eden Emms with Echo results.   Acute Respiratory failure with hypoxia in combination with pulmonary edema and possible PNA. Is on ABX.  Pt has had dyspnea for a 2 months and cardiac cath did not point to CAD with actual improvement in CAD.  He was for pulmonary function studies.  May need pulmonary consult. Hx AVR in 2008, last echo 10/2022 with stable AV,  echo see above Hyponatremia - drinks a lot of water daily,  was to see endocrinology in future to eval for diabetes insipidus  Na up to 132 today Hypersomnia with sleep apnea BIPAP at hs HTN  from notes was on coreg,  intolerant to ACE/ARB and amlodipine - can only take hydralazine BID not TID  BP is stable  Palpitations was to wear a monitor as  outpt. Thrombocytopenia with hx of this   HLD on lipitor as outpt with last lipid panel 08/2022 T chol 129 TG 88 HDL 53 LDL 59 For questions or updates, please contact [facility] Please consult [email or web address] for contact info under        Signed, Nada Boozer, NP

## 2023-03-01 NOTE — Progress Notes (Signed)
   02/28/23 2340  BiPAP/CPAP/SIPAP  BiPAP/CPAP/SIPAP Pt Type Adult  BiPAP/CPAP/SIPAP V60  Mask Type Full face mask  Mask Size Large  Set Rate 12 breaths/min  Respiratory Rate 22 breaths/min  IPAP 10 cmH20  EPAP 5 cmH2O  FiO2 (%) 28 %  Flow Rate 1 lpm  Minute Ventilation 13.5  Leak 20  Peak Inspiratory Pressure (PIP) 10  Tidal Volume (Vt) 653  Patient Home Equipment No  Auto Titrate No  Press High Alarm 35 cmH2O  Press Low Alarm 5 cmH2O  Nasal massage performed  (Just re-applied)  CPAP/SIPAP surface wiped down Yes   Pt. Replaced on BiPAP, tolerating well, RN aware.

## 2023-03-02 ENCOUNTER — Other Ambulatory Visit: Payer: Self-pay

## 2023-03-02 ENCOUNTER — Inpatient Hospital Stay
Admit: 2023-03-02 | Discharge: 2023-03-02 | Disposition: A | Discharge status: Home / Self Care | Payer: Self-pay | Attending: Cardiovascular Disease | Admitting: Cardiovascular Disease

## 2023-03-02 ENCOUNTER — Encounter (HOSPITAL_COMMUNITY): Payer: Self-pay | Admitting: Internal Medicine

## 2023-03-02 DIAGNOSIS — I351 Nonrheumatic aortic (valve) insufficiency: Secondary | ICD-10-CM | POA: Diagnosis not present

## 2023-03-02 DIAGNOSIS — J810 Acute pulmonary edema: Secondary | ICD-10-CM | POA: Diagnosis not present

## 2023-03-02 DIAGNOSIS — J9601 Acute respiratory failure with hypoxia: Secondary | ICD-10-CM | POA: Diagnosis not present

## 2023-03-02 DIAGNOSIS — Z952 Presence of prosthetic heart valve: Secondary | ICD-10-CM | POA: Diagnosis not present

## 2023-03-02 LAB — BASIC METABOLIC PANEL
Anion gap: 7 (ref 5–15)
BUN: 26 mg/dL — ABNORMAL HIGH (ref 8–23)
CO2: 28 mmol/L (ref 22–32)
Calcium: 8 mg/dL — ABNORMAL LOW (ref 8.9–10.3)
Chloride: 96 mmol/L — ABNORMAL LOW (ref 98–111)
Creatinine, Ser: 1.2 mg/dL (ref 0.61–1.24)
GFR, Estimated: >60 mL/min (ref >60)
Glucose, Bld: 144 mg/dL — ABNORMAL HIGH (ref 70–99)
Potassium: 3.6 mmol/L (ref 3.5–5.1)
Sodium: 131 mmol/L — ABNORMAL LOW (ref 135–145)

## 2023-03-02 LAB — CULTURE, BLOOD (ROUTINE X 2)
Culture: NO GROWTH
Culture: NO GROWTH

## 2023-03-02 LAB — CBC
HCT: 29.9 % — ABNORMAL LOW (ref 39.0–52.0)
HCT: 31.4 % — ABNORMAL LOW (ref 39.0–52.0)
Hemoglobin: 10.6 g/dL — ABNORMAL LOW (ref 13.0–17.0)
Hemoglobin: 11 g/dL — ABNORMAL LOW (ref 13.0–17.0)
MCH: 33.9 pg (ref 26.0–34.0)
MCH: 33.4 pg (ref 26.0–34.0)
MCHC: 35.5 g/dL (ref 30.0–36.0)
MCHC: 35 g/dL (ref 30.0–36.0)
MCV: 95.5 fL (ref 80.0–100.0)
MCV: 95.4 fL (ref 80.0–100.0)
Platelets: 153 10*3/uL (ref 150–400)
Platelets: 175 10*3/uL (ref 150–400)
RBC: 3.13 MIL/uL — ABNORMAL LOW (ref 4.22–5.81)
RBC: 3.29 MIL/uL — ABNORMAL LOW (ref 4.22–5.81)
RDW: 11.7 % (ref 11.5–15.5)
RDW: 11.6 % (ref 11.5–15.5)
WBC: 8.7 10*3/uL (ref 4.0–10.5)
WBC: 10 10*3/uL (ref 4.0–10.5)
nRBC: 0 % (ref 0.0–0.2)
nRBC: 0 % (ref 0.0–0.2)

## 2023-03-02 MED ORDER — ALUM & MAG HYDROXIDE-SIMETH 200-200-20 MG/5ML PO SUSP
30.0000 mL | Freq: Four times a day (QID) | ORAL | Status: DC | PRN
  Administered 2023-03-02 – 2023-03-04 (×2): 30 mL via ORAL
  Filled 2023-03-02 (×2): qty 30

## 2023-03-02 MED ORDER — HYDRALAZINE HCL 20 MG/ML IJ SOLN: 10.0000 mg | INTRAMUSCULAR | Status: DC | PRN

## 2023-03-02 MED ORDER — GUAIFENESIN 100 MG/5ML PO LIQD: 5.0000 mL | ORAL | Status: DC | PRN

## 2023-03-02 MED ORDER — IPRATROPIUM-ALBUTEROL 0.5-2.5 (3) MG/3ML IN SOLN: 3.0000 mL | RESPIRATORY_TRACT | Status: DC | PRN

## 2023-03-02 MED ORDER — METOPROLOL TARTRATE 5 MG/5ML IV SOLN: 5.0000 mg | INTRAVENOUS | Status: DC | PRN

## 2023-03-02 MED ORDER — POTASSIUM CHLORIDE CRYS ER 20 MEQ PO TBCR
40.0000 meq | EXTENDED_RELEASE_TABLET | Freq: Once | ORAL | Status: AC
  Administered 2023-03-02: 40 meq via ORAL
  Filled 2023-03-02: qty 2

## 2023-03-02 NOTE — Progress Notes (Signed)
Rounding Note    Patient Name: Randy Duncan Date of Encounter: 03/02/2023  [facility] Cardiologist: Charlton Haws, MD   Subjective   Pt now wants to follow with [facility] / Dr. Eden Emms. Question recent symptoms as possible unstable angina.  Inpatient Medications    Scheduled Meds:  albuterol  2.5 mg Nebulization TID   aspirin EC  81 mg Oral Daily   atorvastatin  80 mg Oral QHS   Chlorhexidine Gluconate Cloth  6 each Topical Q2000   furosemide  40 mg Intravenous BID   hydrALAZINE  25 mg Oral BID   isosorbide mononitrate  15 mg Oral Daily   metoprolol succinate  12.5 mg Oral Daily   mouth rinse  15 mL Mouth Rinse 4 times per day   pantoprazole  40 mg Oral Daily   ranolazine  1,000 mg Oral BID   traZODone  25 mg Oral QHS   Continuous Infusions:  sodium chloride 10 mL/hr at 02/28/23 1807   PRN Meds: sodium chloride, acetaminophen **OR** acetaminophen, clonazepam, docusate sodium, guaiFENesin, hydrALAZINE, ipratropium-albuterol, metoprolol tartrate, nitroGLYCERIN, ondansetron **OR** ondansetron (ZOFRAN) IV, mouth rinse, polyethylene glycol   Vital Signs    Vitals:   03/02/23 0425 03/02/23 0500 03/02/23 0600 03/02/23 0800  BP:  (!) 109/27 (!) 103/29   Pulse:  67 68   Resp:  18 20   Temp: 97.6 F (36.4 C)   97.7 F (36.5 C)  TempSrc: Oral   Oral  SpO2:  96% 95%   Weight:      Height:        Intake/Output Summary (Last 24 hours) at 03/02/2023 0942 Last data filed at 03/02/2023 0835 Gross per 24 hour  Intake 840 ml  Output 3625 ml  Net -2785 ml      03/01/2023    5:00 AM 02/28/2023    6:00 AM 02/27/2023    8:57 AM  Last 3 Weights  Weight (lbs) 161 lb 9.6 oz 160 lb 7.9 oz 157 lb  Weight (kg) 73.3 kg 72.8 kg 71.215 kg      Telemetry    Sinus in the 70s - Personally Reviewed  ECG    No new tracings - Personally Reviewed  Physical Exam   GEN: No acute distress.   Neck: No JVD Cardiac: RRR, 2/6 systolic murmur  Respiratory: Clear to  auscultation bilaterally, diminished in right base GI: Soft, nontender, non-distended  MS: No edema; No deformity. Neuro:  Nonfocal  Psych: Normal affect   Labs    High Sensitivity Troponin:   Recent Labs  Lab 02/27/23 0920 02/27/23 1210 02/27/23 1949 02/27/23 2227  TROPONINIHS 45* 50* 101* 91*     Chemistry Recent Labs  Lab 02/27/23 0920 02/27/23 1949 02/28/23 0306 03/01/23 0306 03/02/23 0310  NA 128*  --  132* 134* 131*  K 4.0  --  4.0 3.5 3.6  CL 98  --  103 100 96*  CO2 20*  --  21* 25 28  GLUCOSE 138*  --  105* 115* 144*  BUN 15  --  15 24* 26*  CREATININE 0.83  --  0.94 1.13 1.20  CALCIUM 8.4*  --  7.9* 8.2* 8.0*  MG  --  1.8  --   --   --   PROT 6.2*  --  5.1*  --   --   ALBUMIN 3.2*  --  2.5*  --   --   AST 28  --  30  --   --  ALT 39  --  39  --   --   ALKPHOS 59  --  46  --   --   BILITOT 1.4*  --  1.0  --   --   GFRNONAA >60  --  >60 >60 >60  ANIONGAP 10  --  8 9 7     Lipids No results for input(s): "CHOL", "TRIG", "HDL", "LABVLDL", "LDLCALC", "CHOLHDL" in the last 168 hours.  Hematology Recent Labs  Lab 03/01/23 0306 03/02/23 0310 03/02/23 0802  WBC 8.3 8.7 10.0  RBC 2.97* 3.13* 3.29*  HGB 10.0* 10.6* 11.0*  HCT 29.0* 29.9* 31.4*  MCV 97.6 95.5 95.4  MCH 33.7 33.9 33.4  MCHC 34.5 35.5 35.0  RDW 11.9 11.7 11.6  PLT 140* 153 175   Thyroid  Recent Labs  Lab 02/28/23 1002  TSH 1.442  FREET4 1.54*    BNP Recent Labs  Lab 02/27/23 0920  BNP 744.7*    DDimer No results for input(s): "DDIMER" in the last 168 hours.   Radiology    DG CHEST PORT 1 VIEW  Result Date: 03/01/2023 CLINICAL DATA:  Pulmonary edema, shortness of breath with cough. EXAM: PORTABLE CHEST 1 VIEW COMPARISON:  Chest x-ray 09/28/2022 FINDINGS: Left perihilar airspace opacities are present. There are small bilateral pleural effusions. The cardiomediastinal silhouette is within normal limits. No acute fractures. IMPRESSION: 1. Left perihilar airspace opacities  concerning for pneumonia. 2. Small bilateral pleural effusions. Electronically Signed   By: Darliss Cheney M.D.   On: 03/01/2023 16:23   ECHOCARDIOGRAM COMPLETE  Result Date: 02/28/2023    ECHOCARDIOGRAM REPORT   Patient Name:   Randy Duncan Date of Exam: 02/28/2023 Medical Rec #:  [id number]        Height:       67.0 in Accession #:    [id number]       Weight:       160.5 lb Date of Birth:  1946-06-13       BSA:          1.842 m Patient Age:    76 years         BP:           127/17 mmHg Patient Gender: M                HR:           83 bpm. Exam Location:  Inpatient Procedure: 2D Echo, Cardiac Doppler and Color Doppler Indications:    CHF Diastolic  History:        Patient has prior history of Echocardiogram examinations. CAD,                 Signs/Symptoms:Fatigue; Risk Factors:Hypertension.                 Aortic Valve: 21 mm Magna pericardial valve is present in the                 aortic position. Procedure Date: 11/2006.  Sonographer:    Milda Smart Referring Phys: [id number] JESSICA U VANN IMPRESSIONS  1. Left ventricular ejection fraction, by estimation, is 60 to 65%. The left ventricle has normal function. The left ventricle has no regional wall motion abnormalities. Left ventricular diastolic parameters are indeterminate.  2. Right ventricular systolic function is normal. The right ventricular size is mildly enlarged.  3. Left atrial size was mild to moderately dilated.  4. Right atrial size was moderately dilated.  5. The mitral valve is  degenerative. Mild mitral valve regurgitation. No evidence of mitral stenosis. Moderate mitral annular calcification.  6. Tricuspid valve regurgitation is mild to moderate.  7. The aortic valve has been repaired/replaced. Aortic valve regurgitation is severe. Mild aortic valve stenosis. There is a 21 mm Magna pericardial valve present in the aortic position. Procedure Date: 11/2006. Aortic regurgitation PHT measures 147 msec. Aortic valve area, by VTI measures 1.46  cm. Aortic valve mean gradient measures 20.0 mmHg. Aortic valve Vmax measures 3.08 m/s.  8. The inferior vena cava is dilated in size with <50% respiratory variability, suggesting right atrial pressure of 15 mmHg. Conclusion(s)/Recommendation(s): There is mild AS and severe AI over bioprosthetic AVR. Suggest TEE to further evalaute. FINDINGS  Left Ventricle: Left ventricular ejection fraction, by estimation, is 60 to 65%. The left ventricle has normal function. The left ventricle has no regional wall motion abnormalities. The left ventricular internal cavity size was normal in size. There is  no left ventricular hypertrophy. Left ventricular diastolic parameters are indeterminate. Right Ventricle: The right ventricular size is mildly enlarged. No increase in right ventricular wall thickness. Right ventricular systolic function is normal. Left Atrium: Left atrial size was mild to moderately dilated. Right Atrium: Right atrial size was moderately dilated. Pericardium: There is no evidence of pericardial effusion. Mitral Valve: The mitral valve is degenerative in appearance. Moderate mitral annular calcification. Mild mitral valve regurgitation. No evidence of mitral valve stenosis. Tricuspid Valve: The tricuspid valve is normal in structure. Tricuspid valve regurgitation is mild to moderate. No evidence of tricuspid stenosis. Aortic Valve: The aortic valve has been repaired/replaced. Aortic valve regurgitation is severe. Aortic regurgitation PHT measures 147 msec. Mild aortic stenosis is present. Aortic valve mean gradient measures 20.0 mmHg. Aortic valve peak gradient measures 37.9 mmHg. Aortic valve area, by VTI measures 1.46 cm. There is a 21 mm Magna pericardial valve present in the aortic position. Procedure Date: 11/2006. Pulmonic Valve: The pulmonic valve was normal in structure. Pulmonic valve regurgitation is trivial. No evidence of pulmonic stenosis. Aorta: The aortic root is normal in size and structure.  Venous: The inferior vena cava is dilated in size with less than 50% respiratory variability, suggesting right atrial pressure of 15 mmHg. IAS/Shunts: No atrial level shunt detected by color flow Doppler. Additional Comments: There is pleural effusion in the left lateral region.  LEFT VENTRICLE PLAX 2D LVIDd:         4.80 cm      Diastology LVIDs:         3.00 cm      LV e' medial:    6.20 cm/s LV PW:         0.80 cm      LV E/e' medial:  21.8 LV IVS:        0.80 cm      LV e' lateral:   4.90 cm/s LVOT diam:     1.90 cm      LV E/e' lateral: 27.6 LV SV:         93 LV SV Index:   50 LVOT Area:     2.84 cm  LV Volumes (MOD) LV vol d, MOD A2C: 91.7 ml LV vol d, MOD A4C: 100.0 ml LV vol s, MOD A2C: 32.8 ml LV vol s, MOD A4C: 32.6 ml LV SV MOD A2C:     58.9 ml LV SV MOD A4C:     100.0 ml LV SV MOD BP:      62.8 ml RIGHT VENTRICLE  IVC RV S prime:     12.10 cm/s  IVC diam: 2.00 cm TAPSE (M-mode): 1.9 cm LEFT ATRIUM             Index        RIGHT ATRIUM           Index LA diam:        3.80 cm 2.06 cm/m   RA Area:     16.80 cm LA Vol (A2C):   44.5 ml 24.16 ml/m  RA Volume:   47.40 ml  25.74 ml/m LA Vol (A4C):   39.2 ml 21.28 ml/m LA Biplane Vol: 43.8 ml 23.78 ml/m  AORTIC VALVE AV Area (Vmax):    1.53 cm AV Area (Vmean):   1.50 cm AV Area (VTI):     1.46 cm AV Vmax:           308.00 cm/s AV Vmean:          208.000 cm/s AV VTI:            0.635 m AV Peak Grad:      37.9 mmHg AV Mean Grad:      20.0 mmHg LVOT Vmax:         166.00 cm/s LVOT Vmean:        110.000 cm/s LVOT VTI:          0.328 m LVOT/AV VTI ratio: 0.52 AI PHT:            147 msec  AORTA Ao Root diam: 2.80 cm Ao Asc diam:  3.20 cm MITRAL VALVE                TRICUSPID VALVE MV Area (PHT): 4.29 cm     TR Peak grad:   51.3 mmHg MV Decel Time: 177 msec     TR Mean grad:   36.0 mmHg MR Peak grad: 118.8 mmHg    TR Vmax:        358.00 cm/s MR Mean grad: 75.5 mmHg     TR Vmean:       291.0 cm/s MR Vmax:      545.00 cm/s MR Vmean:     411.5 cm/s     SHUNTS MV E velocity: 135.00 cm/s  Systemic VTI:  0.33 m                             Systemic Diam: 1.90 cm Arvilla Meres MD Electronically signed by Arvilla Meres MD Signature Date/Time: 02/28/2023/4:51:21 PM    Final     Cardiac Studies   ECHO 02/28/23 IMPRESSIONS  1. Left ventricular ejection fraction, by estimation, is 60 to 65%. The  left ventricle has normal function. The left ventricle has no regional  wall motion abnormalities. Left ventricular diastolic parameters are  indeterminate.   2. Right ventricular systolic function is normal. The right ventricular  size is mildly enlarged.   3. Left atrial size was mild to moderately dilated.   4. Right atrial size was moderately dilated.   5. The mitral valve is degenerative. Mild mitral valve regurgitation. No  evidence of mitral stenosis. Moderate mitral annular calcification.   6. Tricuspid valve regurgitation is mild to moderate.   7. The aortic valve has been repaired/replaced. Aortic valve  regurgitation is severe. Mild aortic valve stenosis. There is a 21 mm  Magna pericardial valve present in the aortic position. Procedure Date:  11/2006. Aortic regurgitation PHT measures 147  msec.  Aortic valve area, by VTI measures 1.46 cm. Aortic valve mean  gradient measures 20.0 mmHg. Aortic valve Vmax measures 3.08 m/s.   8. The inferior vena cava is dilated in size with <50% respiratory  variability, suggesting right atrial pressure of 15 mmHg.   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 Suggest TEE to further evalaute.   Patient Profile     77 y.o. male followed by Dr. Fransisco Beau with [facility] with a hx of CAD, DM2, hx of bioprosthetic aortic valve repair in 2008, hx of ascending aorta repair, centrilobular emphysema, celiac disease, hyperglycemia, sleep disorder, pancytopenia now admitted with PNA and CHF and chest pain.     Echo concerning There is mild AS and severe AI over  bioprosthetic AVR. Suggest TEE to further evalaute.   Assessment & Plan    AS/AI s/p bioprosthetic valve 2008 TTE with severe AR, degenerated bioprosthetic valve Question calcified prolapsed leaflet Not a surgical candidate for re-do sternotomy Could consider valve-in-valve TAVR - he now wants to transfer care to [facility] with Dr. Eden Emms - recent heart cath, consider CT for anatomy if considering evaluation with structural heart team +/- TEE   Fever No further fever BC x 2 NGTD after 3 days   Pulmonary edema Acute respiratory failure with hypoxia BNP 745 Echo with LVEF 60-65%, BAE, mild MR, AI - has diuresed with 40 mg IV lasix BID with 4L urine output yesterday - now on room air - appears euvolemic on exam - transition to PO lasix - received 40 mg IV lasix this morning, will hold further IV doses and start 40 mg PO lasix daily tomorrow   CAD Known D1 disease with failed PCI in the past Elevated troponin felt demand ischemia Last heart cath 01/2023 at [facility] after false positive nuclear stress test showed nonobstructive disease, elevated LVEDP - on low dose imdur and ranexa 1000 mg BID, 12.5 mg toprol - continue ASA   Chest tightness Pt reports chest tightness sometimes relieved with benzodiazepine - in May, had left arm pain and numbness radiating to left chest, but on the day after playing golf - attempted to assess activity level, does not climb stairs and does not walk in big box stores, plays golf once weekly but rides golf cart - reassuring heart cath as above   Hypertension Maintained on hydralazine 25 mg BID, imdur 15 mg,   Allergy to amlodipine   Hyperlipidemia with LDL goal < 70 Continue 80 mg lipitor     For questions or updates, please contact [facility] Please consult [email or web address] for contact info under        Signed, Marcelino Duster, PA  03/02/2023, 9:42 AM

## 2023-03-02 NOTE — H&P (View-Only) (Signed)
[facility]   MULTIDISCIPLINARY HEART VALVE TEAM  Cardiology Consultation:   Patient ID: Randy Duncan MRN: [id number]; DOB: 1945-11-17  Admit date: 02/27/2023 Date of Consult: 03/03/2023  Primary Care Provider: Tracey Harries, MD [facility] Cardiologist: Charlton Haws, MD  [facility] [place] [facility] Electrophysiologist:  None    Patient Profile:   Randy Duncan is a 77 y.o. 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 Eden Emms.  History of Present Illness:   Randy Duncan lives in [place], [place] with his wife. He has three children. He is an avid golfer and has not been able to golf recently due to lifestyle limiting symptoms of shortness of breath, fatigue, palpitations. Of note, he has poor dentition and only a few remaining native teeth.   He has a history of an ascending aorta repair and AVR with a 21 mm Edwards Magna stented bioprosthetic valve in 2008 at [place] with Dr. Lequita Halt. He has done well and remained active since that time. Echo 11/11/22 showed EF 65% and normally functioning prosthetic valve well seated with a mean gradient of 11 mmHg and no reported AR.  In early May he developed general malaise, shortness of breath, palpitation and exercise intolerance. CT angio of chest 01/26/23 with no PE, acute infiltrate or effusion. Nuclear stress test 01/28/23 was borderline for demonstration of inducible ischemia in the inferior region. The patient subsequently had a cardiac catheterization on 02/04/23 with Dr. Ivan Croft which showed a 55% proximal diagonal lesion, 30% mid RCA lesion and otherwise no significant obstruction, but did show an elevated left ventricular end-diastolic pressure with hypertension. Of note, the first diagonal lesion was thought to be 90% obstructed  on prior cardiac catheterization in 2020 but now reduced to 50 to 60%. He then underwent outpatient titration of BP meds (several medication intolerances noted.)  He was most recently seen by Dr. Fransisco Beau at [facility] on 02/21/23 and complained of worsening exercise intolerance, dyspnea as well as palpitations. Given recent reassuring cardiac/pulmonary testing, he was reassured and plans were made for a holter monitor.   Saw PCP 02/22/23 with multiple complaints including dyspnea, polyuria and polydipsia. PFTs and referral to pulm/endocrinology ordered.  He then presented to [facility] ED on 02/27/23 for evaluation of fever and cough, chest pain as well as chronic polyuria and polydipsia. Found to have an elevated BNP and pulmonary vascular congestion on CXR and CTA. No PE. Infectious process not favored. He was started on broad spectrum abx and IV Lasix/BIPAP. Echo 02/28/23 showed EF 60-65%, mild RVE, mild MR, mild to mod TR, mild AS (mean gradient 20mm hg, AVA 1.46cm2) with severe bioprosthetic AR with a PHT of . Blood cultures negative and no evidence of bacterial endocarditis He was felt to more likely have calcified degeneration with prolapsed leaflet. TEE recommended and he was transferred to [facility] for TEE and structural heart consultation.   He is seen lying in bed with wife at bedside. He is breathing much better since diuresis. He is so thankful to have found a diagnosis and potential treatment option. He is aware of his options being open surgical replacement vs valve in valve TAVR and strongly favors TAVR.   Past Medical History:  Diagnosis Date   Abdominal bloating    Celiac disease  Fatigue    Hypersomnia with sleep apnea, unspecified 06/22/2013   Hypoglycemia    Impaired fasting glucose    Sleep disorder    Snoring    Snoring 06/22/2013    Past Surgical History:  Procedure Laterality Date   AORTIC VALVE REPAIR  11/2006   CATARACT EXTRACTION Bilateral 2010    TUMOR EXCISION     Left parotid gland ([place] [employer]     Home Medications:  Prior to Admission medications   Medication Sig Start Date End Date Taking? Authorizing Provider  aspirin EC 81 MG tablet Take 81 mg by mouth in the morning. Swallow whole.   Yes [provider]  atorvastatin (LIPITOR) 80 MG tablet Take 80 mg by mouth at bedtime.   Yes [provider]  Cholecalciferol (VITAMIN D3) 125 MCG (5000 UT) CAPS Take 5,000 Units by mouth in the morning.   Yes [provider]  clonazePAM (KLONOPIN) 0.5 MG tablet Take 0.25 mg by mouth every 4 (four) hours. 05/10/13  Yes [provider]  guaiFENesin-dextromethorphan (ROBITUSSIN DM) 100-10 MG/5ML syrup Take 5 mLs by mouth every 4 (four) hours as needed for cough.   Yes [provider]  hydrALAZINE (APRESOLINE) 25 MG tablet Take 25 mg by mouth in the morning and at bedtime. 02/17/23  Yes [provider]  metoprolol succinate (TOPROL-XL) 25 MG 24 hr tablet Take 12.5 mg by mouth at bedtime.   Yes [provider]  ranolazine (RANEXA) 1000 MG SR tablet Take 1,000 mg by mouth in the morning and at bedtime.   Yes [provider]  ZYRTEC ALLERGY 10 MG tablet Take 10 mg by mouth See admin instructions. Take 10 mg by mouth in the evening-bedtime, along with the 2nd daily dose of Hydralazine   Yes [provider]    Inpatient Medications: Scheduled Meds:  albuterol  2.5 mg Nebulization TID   aspirin EC  81 mg Oral Daily   atorvastatin  80 mg Oral QHS   Chlorhexidine Gluconate Cloth  6 each Topical Q2000   furosemide  40 mg Oral Daily   hydrALAZINE  25 mg Oral BID   isosorbide mononitrate  15 mg Oral Daily   metoprolol succinate  12.5 mg Oral Daily   mouth rinse  15 mL Mouth Rinse 4 times per day   pantoprazole  40 mg Oral Daily   ranolazine  1,000 mg Oral BID   traZODone  25 mg Oral QHS   Continuous Infusions:  sodium chloride 10 mL/hr at 02/28/23 1807   PRN  Meds: sodium chloride, acetaminophen **OR** acetaminophen, alum & mag hydroxide-simeth, clonazePAM, docusate sodium, guaiFENesin, hydrALAZINE, ipratropium-albuterol, metoprolol tartrate, nitroGLYCERIN, ondansetron **OR** ondansetron (ZOFRAN) IV, mouth rinse, polyethylene glycol  Allergies:    Allergies  Allergen Reactions   Losartan Potassium Itching and Swelling   Wheat Rash and Other (See Comments)    Celiac disease   Naproxen Other (See Comments)    Sweats and stomach upset     Amlodipine    Coreg [Carvedilol]    Hydralazine Other (See Comments)    Cannot tolerate this at 3 times a day    Lisinopril    Bupropion Nausea Only and Other (See Comments)    Upset stomach   Cephalexin Nausea Only and Other (See Comments)    upset stomach    Social History:   Social History   Socioeconomic History   Marital status: Married    Spouse name: Harriett Sine   Number of children: 3  Years of education: 33   Highest education level: Not on file  Occupational History    Employer: [employer]  Tobacco Use   Smoking status: Former    Packs/day: 1.00    Years: 40.00    Additional pack years: 0.00    Total pack years: 40.00    Types: Cigarettes   Smokeless tobacco: Never   Tobacco comments:    quit 02/2012  Vaping Use   Vaping Use: Never used  Substance and Sexual Activity   Alcohol use: No    Comment: quit in 2006   Drug use: No   Sexual activity: Not on file  Other Topics Concern   Not on file  Social History Narrative   Patient is right handed, consumes 1 soda daily,resides with wife and 2 adult children.   Social Determinants of Health   Financial Resource Strain: Not on file  Food Insecurity: No Food Insecurity (03/02/2023)   Hunger Vital Sign    Worried About Running Out of Food in the Last Year: Never true    Ran Out of Food in the Last Year: Never true  Transportation Needs: No Transportation Needs (03/02/2023)   PRAPARE - Administrator, Civil Service (Medical):  No    Lack of Transportation (Non-Medical): No  Physical Activity: Not on file  Stress: Not on file  Social Connections: Not on file  Intimate Partner Violence: Not At Risk (03/02/2023)   Humiliation, Afraid, Rape, and Kick questionnaire    Fear of Current or Ex-Partner: No    Emotionally Abused: No    Physically Abused: No    Sexually Abused: No    Family History:   Family History  Problem Relation Age of Onset   Emphysema Maternal Grandfather    Celiac disease Daughter    Lung disease Neg Hx      ROS:  lease see the history of present illness.  All other ROS reviewed and negative.     Physical Exam/Data:   Vitals:   03/03/23 0100 03/03/23 0400 03/03/23 0731 03/03/23 0805  BP:  (!) 117/44  (!) 126/46  Pulse:  67  74  Resp:  15 20 18   Temp:  98 F (36.7 C)  97.6 F (36.4 C)  TempSrc:  Oral  Oral  SpO2:  93%  94%  Weight: 67.3 kg     Height:        Intake/Output Summary (Last 24 hours) at 03/03/2023 0917 Last data filed at 03/03/2023 0556 Gross per 24 hour  Intake 680 ml  Output 2550 ml  Net -1870 ml      03/03/2023    1:00 AM 03/02/2023   11:25 AM 03/01/2023    5:00 AM  Last 3 Weights  Weight (lbs) 148 lb 5.9 oz 162 lb 14.7 oz 161 lb 9.6 oz  Weight (kg) 67.3 kg 73.9 kg 73.3 kg     Body mass index is 23.24 kg/m.  General:  Well nourished, well developed, in no acute distress HEENT: normal Lymph: no adenopathy Neck: no JVD Endocrine:  No thryomegaly Vascular: No carotid bruits; FA pulses 2+ bilaterally without bruits  Cardiac:  normal S1, S2; RRR; 3/6 diastolic murmur heard best at RUSB Lungs:  clear to auscultation bilaterally, no wheezing, rhonchi or rales  Abd: soft, nontender, no hepatomegaly  Ext: no edema Musculoskeletal:  No deformities, BUE and BLE strength normal and equal Skin: warm and dry, bandage on nose.  Neuro:  CNs 2-12 intact, no focal abnormalities  noted Psych:  Normal affect   EKG:  The EKG was personally reviewed and demonstrates:   sinus with non specific ST/TW abnormalities. Anteroseptal q waves, HR 82,  Telemetry:  Telemetry was personally reviewed and demonstrates:  sinus   Relevant CV Studies: ECHO 02/28/23 IMPRESSIONS  1. Left ventricular ejection fraction, by estimation, is 60 to 65%. The  left ventricle has normal function. The left ventricle has no regional  wall motion abnormalities. Left ventricular diastolic parameters are  indeterminate.   2. Right ventricular systolic function is normal. The right ventricular  size is mildly enlarged.   3. Left atrial size was mild to moderately dilated.   4. Right atrial size was moderately dilated.   5. The mitral valve is degenerative. Mild mitral valve regurgitation. No  evidence of mitral stenosis. Moderate mitral annular calcification.   6. Tricuspid valve regurgitation is mild to moderate.   7. The aortic valve has been repaired/replaced. Aortic valve  regurgitation is severe. Mild aortic valve stenosis. There is a 21 mm  Magna pericardial valve present in the aortic position. Procedure Date:  11/2006. Aortic regurgitation PHT measures 147  msec. Aortic valve area, by VTI measures 1.46 cm. Aortic valve mean  gradient measures 20.0 mmHg. Aortic valve Vmax measures 3.08 m/s.   8. The inferior vena cava is dilated in size with <50% respiratory  variability, suggesting right atrial pressure of 15 mmHg.   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 Suggest TEE to further evalaute.   _________________________  Heart cath  02/04/2023 (done at [facility], images in [facility])  Hemodynamics:  1. Central Aortic Pressure -140/50 mmHg. Mean 87 mmHg 2. Left Ventricular Pressure-166/9 mmHg Additional comments on on hemodynamics:  LVEDP 18 mmHg.  Peak to peak aortic valve gradient 26 mmHg.  Coronary Angiography:  Antatomically right dominant Mild fluoroscopic coronary calcification   Left Main: Normal Left anterior  descending artery: Mild irregularities. No focal angiographic or obstructive stenosis Diagonal branches: 2 diagonal branches. First diagonal branch has an upper takeoff and has an ostial stenosis that appears to be 50 to 60%. The vessel appears to be larger than the time of his previous catheterization 4 years ago. There is TIMI-3 flow in the diagonal branch. Second diagonal branch has mild irregularities Left Circumflex: Anatomically nondominant. Minor luminal irregularities. No focal mammographic obstructive stenosis Obtuse Marginal branches: Mild irregularities Right Coronary Artery: Anatomically dominant. Large vessel. Ectasia in the midsegment. 30% stenosis in the midsegment. Mild irregularities proximally and distally. No focal angiographic or obstructive stenosis. Posterior Descending Artery: Mild irregularities Posterolateral ventricular branch: Moderate-sized vessel. Mild irregularities  Laboratory Data:  High Sensitivity Troponin:   Recent Labs  Lab 02/27/23 0920 02/27/23 1210 02/27/23 1949 02/27/23 2227  TROPONINIHS 45* 50* 101* 91*     Chemistry Recent Labs  Lab 03/01/23 0306 03/02/23 0310 03/03/23 0422  NA 134* 131* 133*  K 3.5 3.6 3.9  CL 100 96* 99  CO2 25 28 27   GLUCOSE 115* 144* 121*  BUN 24* 26* 19  CREATININE 1.13 1.20 1.06  CALCIUM 8.2* 8.0* 8.2*  GFRNONAA >60 >60 >60  ANIONGAP 9 7 7     Recent Labs  Lab 02/27/23 0920 02/28/23 0306  PROT 6.2* 5.1*  ALBUMIN 3.2* 2.5*  AST 28 30  ALT 39 39  ALKPHOS 59 46  BILITOT 1.4* 1.0   Hematology Recent Labs  Lab 03/01/23 0306 03/02/23 0310 03/02/23 0802  WBC 8.3 8.7 10.0  RBC 2.97* 3.13* 3.29*  HGB 10.0* 10.6* 11.0*  HCT 29.0* 29.9* 31.4*  MCV 97.6 95.5 95.4  MCH 33.7 33.9 33.4  MCHC 34.5 35.5 35.0  RDW 11.9 11.7 11.6  PLT 140* 153 175   BNP Recent Labs  Lab 02/27/23 0920  BNP 744.7*    DDimer No results for input(s): "DDIMER" in the last 168 hours.   Radiology/Studies:  DG CHEST PORT 1  VIEW  Result Date: 03/01/2023 CLINICAL DATA:  Pulmonary edema, shortness of breath with cough. EXAM: PORTABLE CHEST 1 VIEW COMPARISON:  Chest x-ray 09/28/2022 FINDINGS: Left perihilar airspace opacities are present. There are small bilateral pleural effusions. The cardiomediastinal silhouette is within normal limits. No acute fractures. IMPRESSION: 1. Left perihilar airspace opacities concerning for pneumonia. 2. Small bilateral pleural effusions. Electronically Signed   By: Darliss Cheney M.D.   On: 03/01/2023 16:23   ECHOCARDIOGRAM COMPLETE  Result Date: 02/28/2023    ECHOCARDIOGRAM REPORT   Patient Name:   Randy Duncan Date of Exam: 02/28/2023 Medical Rec #:  [id number]        Height:       67.0 in Accession #:    [id number]       Weight:       160.5 lb Date of Birth:  Nov 03, 1945       BSA:          1.842 m Patient Age:    76 years         BP:           127/17 mmHg Patient Gender: M                HR:           83 bpm. Exam Location:  Inpatient Procedure: 2D Echo, Cardiac Doppler and Color Doppler Indications:    CHF Diastolic  History:        Patient has prior history of Echocardiogram examinations. CAD,                 Signs/Symptoms:Fatigue; Risk Factors:Hypertension.                 Aortic Valve: 21 mm Magna pericardial valve is present in the                 aortic position. Procedure Date: 11/2006.  Sonographer:    Milda Smart Referring Phys: [id number] JESSICA U VANN IMPRESSIONS  1. Left ventricular ejection fraction, by estimation, is 60 to 65%. The left ventricle has normal function. The left ventricle has no regional wall motion abnormalities. Left ventricular diastolic parameters are indeterminate.  2. Right ventricular systolic function is normal. The right ventricular size is mildly enlarged.  3. Left atrial size was mild to moderately dilated.  4. Right atrial size was moderately dilated.  5. The mitral valve is degenerative. Mild mitral valve regurgitation. No evidence of mitral stenosis.  Moderate mitral annular calcification.  6. Tricuspid valve regurgitation is mild to moderate.  7. The aortic valve has been repaired/replaced. Aortic valve regurgitation is severe. Mild aortic valve stenosis. There is a 21 mm Magna pericardial valve present in the aortic position. Procedure Date: 11/2006. Aortic regurgitation PHT measures 147 msec. Aortic valve area, by VTI measures 1.46 cm. Aortic valve mean gradient measures 20.0 mmHg. Aortic valve Vmax measures 3.08 m/s.  8. The inferior vena cava is dilated in size with <50% respiratory variability, suggesting right atrial pressure of 15 mmHg. Conclusion(s)/Recommendation(s): There is mild AS and severe AI over bioprosthetic AVR. Suggest  TEE to further evalaute. FINDINGS  Left Ventricle: Left ventricular ejection fraction, by estimation, is 60 to 65%. The left ventricle has normal function. The left ventricle has no regional wall motion abnormalities. The left ventricular internal cavity size was normal in size. There is  no left ventricular hypertrophy. Left ventricular diastolic parameters are indeterminate. Right Ventricle: The right ventricular size is mildly enlarged. No increase in right ventricular wall thickness. Right ventricular systolic function is normal. Left Atrium: Left atrial size was mild to moderately dilated. Right Atrium: Right atrial size was moderately dilated. Pericardium: There is no evidence of pericardial effusion. Mitral Valve: The mitral valve is degenerative in appearance. Moderate mitral annular calcification. Mild mitral valve regurgitation. No evidence of mitral valve stenosis. Tricuspid Valve: The tricuspid valve is normal in structure. Tricuspid valve regurgitation is mild to moderate. No evidence of tricuspid stenosis. Aortic Valve: The aortic valve has been repaired/replaced. Aortic valve regurgitation is severe. Aortic regurgitation PHT measures 147 msec. Mild aortic stenosis is present. Aortic valve mean gradient measures  20.0 mmHg. Aortic valve peak gradient measures 37.9 mmHg. Aortic valve area, by VTI measures 1.46 cm. There is a 21 mm Magna pericardial valve present in the aortic position. Procedure Date: 11/2006. Pulmonic Valve: The pulmonic valve was normal in structure. Pulmonic valve regurgitation is trivial. No evidence of pulmonic stenosis. Aorta: The aortic root is normal in size and structure. Venous: The inferior vena cava is dilated in size with less than 50% respiratory variability, suggesting right atrial pressure of 15 mmHg. IAS/Shunts: No atrial level shunt detected by color flow Doppler. Additional Comments: There is pleural effusion in the left lateral region.  LEFT VENTRICLE PLAX 2D LVIDd:         4.80 cm      Diastology LVIDs:         3.00 cm      LV e' medial:    6.20 cm/s LV PW:         0.80 cm      LV E/e' medial:  21.8 LV IVS:        0.80 cm      LV e' lateral:   4.90 cm/s LVOT diam:     1.90 cm      LV E/e' lateral: 27.6 LV SV:         93 LV SV Index:   50 LVOT Area:     2.84 cm  LV Volumes (MOD) LV vol d, MOD A2C: 91.7 ml LV vol d, MOD A4C: 100.0 ml LV vol s, MOD A2C: 32.8 ml LV vol s, MOD A4C: 32.6 ml LV SV MOD A2C:     58.9 ml LV SV MOD A4C:     100.0 ml LV SV MOD BP:      62.8 ml RIGHT VENTRICLE             IVC RV S prime:     12.10 cm/s  IVC diam: 2.00 cm TAPSE (M-mode): 1.9 cm LEFT ATRIUM             Index        RIGHT ATRIUM           Index LA diam:        3.80 cm 2.06 cm/m   RA Area:     16.80 cm LA Vol (A2C):   44.5 ml 24.16 ml/m  RA Volume:   47.40 ml  25.74 ml/m LA Vol (A4C):   39.2 ml 21.28 ml/m LA Biplane  Vol: 43.8 ml 23.78 ml/m  AORTIC VALVE AV Area (Vmax):    1.53 cm AV Area (Vmean):   1.50 cm AV Area (VTI):     1.46 cm AV Vmax:           308.00 cm/s AV Vmean:          208.000 cm/s AV VTI:            0.635 m AV Peak Grad:      37.9 mmHg AV Mean Grad:      20.0 mmHg LVOT Vmax:         166.00 cm/s LVOT Vmean:        110.000 cm/s LVOT VTI:          0.328 m LVOT/AV VTI ratio: 0.52 AI  PHT:            147 msec  AORTA Ao Root diam: 2.80 cm Ao Asc diam:  3.20 cm MITRAL VALVE                TRICUSPID VALVE MV Area (PHT): 4.29 cm     TR Peak grad:   51.3 mmHg MV Decel Time: 177 msec     TR Mean grad:   36.0 mmHg MR Peak grad: 118.8 mmHg    TR Vmax:        358.00 cm/s MR Mean grad: 75.5 mmHg     TR Vmean:       291.0 cm/s MR Vmax:      545.00 cm/s MR Vmean:     411.5 cm/s    SHUNTS MV E velocity: 135.00 cm/s  Systemic VTI:  0.33 m                             Systemic Diam: 1.90 cm Arvilla Meres MD Electronically signed by Arvilla Meres MD Signature Date/Time: 02/28/2023/4:51:21 PM    Final    CT Angio Chest PE W and/or Wo Contrast  Result Date: 02/27/2023 CLINICAL DATA:  Pulmonary embolism (PE) suspected, high prob EXAM: CT ANGIOGRAPHY CHEST WITH CONTRAST TECHNIQUE: Multidetector CT imaging of the chest was performed using the standard protocol during bolus administration of intravenous contrast. Multiplanar CT image reconstructions and MIPs were obtained to evaluate the vascular anatomy. RADIATION DOSE REDUCTION: This exam was performed according to the departmental dose-optimization program which includes automated exposure control, adjustment of the mA and/or kV according to patient size and/or use of iterative reconstruction technique. CONTRAST:  75mL OMNIPAQUE IOHEXOL 350 MG/ML SOLN COMPARISON:  X-ray 02/27/2023 FINDINGS: Cardiovascular: Satisfactory opacification of the pulmonary arteries to the segmental level. No evidence of pulmonary embolism. Thoracic aorta is nonaneurysmal. Aortic valve replacement. Scattered atherosclerotic vascular calcifications of the aorta and coronary arteries. Normal heart size. No pericardial effusion. Mediastinum/Nodes: Mildly prominent mediastinal lymph nodes including 1.1 cm precarinal node (series 4, image 56) and 1.3 cm AP window node (series 4, image 50). Suspect mildly prominent bilateral hilar lymph nodes, evaluation of which is limited in the  presence of dense perihilar opacities. No axillary lymphadenopathy. Thyroid gland, trachea, and esophagus demonstrate no significant abnormality. Lungs/Pleura: Moderate layering bilateral pleural effusions with compressive atelectasis of the bilateral lower lobes. Dense airspace consolidations within the bilateral perihilar regions and within the right upper lobe. Surrounding ground-glass opacity. Diffuse interlobular septal thickening. No pneumothorax. Upper Abdomen: No acute abnormality. Musculoskeletal: Prior sternotomy. No acute bony abnormality. No chest wall abnormality. Review of the MIP images confirms the above findings.  IMPRESSION: 1. No evidence of pulmonary embolism. 2. Dense airspace consolidations within the bilateral perihilar regions and within the right upper lobe with surrounding ground-glass opacity. Findings are favored to represent pulmonary edema. Superimposed pneumonia would be difficult to exclude. 3. Moderate layering bilateral pleural effusions with compressive atelectasis of the bilateral lower lobes. 4. Mildly prominent mediastinal and hilar lymph nodes, likely reactive. 5. Aortic and coronary artery atherosclerosis (ICD10-I70.0). Electronically Signed   By: Duanne Guess D.O.   On: 02/27/2023 12:49    Procedure Type: Isolated AVR PERIOPERATIVE OUTCOME ESTIMATE % Operative Mortality 2.3% Morbidity & Mortality 15.5% Stroke 3.1% Renal Failure 2.21% Reoperation 7.6% Prolonged Ventilation 8.58% Deep Sternal Wound Infection 0.097% Long Hospital Stay (>14 days) 9.65% Short Hospital Stay (<6 days)* 30.6%   _______________________________   [facility] Cardiomyopathy Questionnaire     03/02/2023    4:22 PM  KCCQ-12  1 a. Ability to shower/bathe Moderately limited  1 b. Ability to walk 1 block Extremely limited  1 c. Ability to hurry/jog Extremely limited  2. Edema feet/ankles/legs 1-2 times a week  3. Limited by fatigue All of the time  4. Limited by dyspnea All of  the time  5. Sitting up / on 3+ pillows Never over the past 2 weeks  6. Limited enjoyment of life Extremely limited  7. Rest of life w/ symptoms Not at all satisfied  8 a. Participation in hobbies Severely limited  8 b. Participation in chores Severely limited  8 c. Visiting family/friends Severely limited       Assessment and Plan:   Randy Duncan is a 77 y.o. male with symptoms of severe bioprosthetic aortic valve regurgitation with NYHA Class IV symptoms currently admitted with acute pulmonary edema requiring IV diuresis.  Cardiac cath at [facility] on 02/04/23 showed mild non obstructive CAD with elevated LVEDP and HTN. Images in [facility].   Echo 02/28/23 showed EF 60-65%, mild RVE, mild MR, mild to mod TR, mild AS (mean gradient 20mm hg, AVA 1.46cm2) with severe bioprosthetic AR with a PHT of . Blood cultures negative and no evidence of bacterial endocarditis He was felt to more likely have calcified degeneration with prolapsed leaflet. TEE recommended and he was transferred to [facility] for TEE and structural heart consultation.    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 We have discussed the limitations of medical therapy and the poor prognosis associated with his condition. We have reviewed potential treatment options, including palliative medical therapy, conventional surgical aortic valve replacement, and transcatheter aortic valve replacement. We discussed treatment options in the context of this patient's specific comorbid medical conditions.    [name] predicted risk of mortality with conventional aortic valve replacement is 2.3% primarily based on previous valve replacement with sternotomy, age, acute CHF. Valve in Vavle TAVR seems like a reasonable treatment option and the patient and family strongly favor this approach. We discussed typical evaluation which will require a gated cardiac CTA and a CTA of the  chest/abdomen/pelvis to evaluate both his cardiac anatomy and peripheral vasculature. This has been set up for today and TEE tomorrow.    Dr. Leafy Ro to follow.   Signed, Cline Crock, PA-C  03/03/2023 9:17 AM  Agree with above Pt with aortic valve prosthetic dysfunction with severe AI and normal LV fucntion. I agree that he requires AVR and with the previous surgery would best be served with valve in valve TAVR. Pt requires further time to  improve with medical management and diuresis. Has femoral access it appears. He would be considered for surgical intervention if some issues arise at time of TAVR.

## 2023-03-02 NOTE — Progress Notes (Signed)
OT Cancellation Note  Patient Details Name: Randy Duncan MRN: [id number] DOB: 06/27/1946   Cancelled Treatment:    Reason Eval/Treat Not Completed: Other (comment). The pt is off the unit. Will attempt another day.   Reuben Likes, OTR/L 03/02/2023, 4:18 PM

## 2023-03-02 NOTE — Progress Notes (Signed)
Rt gave pt flutter valve per MD order. Pt knows and understands how to use. 

## 2023-03-02 NOTE — Progress Notes (Signed)
   03/02/23 2138  BiPAP/CPAP/SIPAP  Reason BIPAP/CPAP not in use Other(comment) (Ordered PRN)   Pt in no need of Bipap at this time.  RT will continue to monitor

## 2023-03-02 NOTE — Progress Notes (Signed)
PROGRESS NOTE    Randy Duncan  ZOX:[id number] DOB: 09-Dec-1945 DOA: 02/27/2023 PCP: Tracey Harries, MD   Brief Narrative:  77 y.o. male with medical history significant of celiac disease, fatigue, hypersomnia, hyperglycemia, impaired fasting glucose, sleep disorder, emphysema, hypertension, CAD, abnormal nuclear stress test, aortic valve replacement, PVCs, pancytopenia who presented to the emergency department complaints of fever of 101 F at home, dark sputum productive cough, chest pain and dyspnea with started day prior to his admission.  Upon admission noted to be hypoxic and had pulmonary edema with elevated troponin.  Cardiology team was consulted.   Assessment & Plan:  Principal Problem:   Acute respiratory failure with hypoxia (HCC) Active Problems:   Hypersomnia with sleep apnea   Coronary artery disease involving native coronary artery of native heart without angina pectoris   Essential hypertension   Pressure injury of skin   Normocytic anemia   Thrombocytopenia (HCC)   Hyponatremia   Hyperbilirubinemia   Mild protein malnutrition (HCC)   Elevated troponin   Pulmonary edema   Acute on chronic combined systolic and diastolic CHF (congestive heart failure) (HCC)   S/P AVR (aortic valve replacement)   Precordial pain   Severe aortic valve regurgitation    Acute respiratory failure with hypoxia (HCC) Pulmonary edema Acute congestive heart failure with preserved ejection fraction -Patient's symptoms slowly improving with IV Lasix.  Procalcitonin negative therefore antibiotics were discontinued.  Monitor electrolytes and urine output.  Fluid restriction.    Elevated troponin with history of CAD History of aortic valve replacement in 2008 -Suspect demand ischemia.  Recent echocardiogram showed normal EF and stable aortic valve.  Patient will be transferred to [facility] for further cardiac evaluation including TEE, structural cardiology team and CT surgery.   Appropriate services will be consulted by cardiology team -Continue aspirin, statin, Toprol-XL, Imdur, Ranexa, hydralazine -Echo shows EF 65%, some dilated cardiomyopathy    Hyponatremia, likely from hypervolemia Improved with Lasix.     Hypersomnia with sleep apnea BiPAP at bedtime     Essential hypertension Continue Toprol-XL, hydralazine.  IV as needed    Normocytic anemia Monitor hematocrit and hemoglobin.     Thrombocytopenia (HCC) Stable without evidence of bleeding     Mild protein malnutrition (HCC) May be due to hemodilution. Protein supplementation as needed.     PT eval--recommends home health.  Face-to-face completed Transfer patient to [facility] for further cardiac evaluation     DVT prophylaxis: SCDs Start: 02/27/23 1621     Code Status: Full Code Family Communication: wife and son at bedside   Status is: Inpatient Patient being transferred to [facility] for further cardiac evaluation  Cardiology team   Pressure Injury 02/27/23 Sacrum Stage 1 -  Intact skin with non-blanchable redness of a localized area usually over a bony prominence. (Active)  02/27/23 1739  Location: Sacrum  Location Orientation:   Staging: Stage 1 -  Intact skin with non-blanchable redness of a localized area usually over a bony prominence.  Wound Description (Comments):   Present on Admission: Yes     Diet Orders (From admission, onward)     Start     Ordered   02/27/23 1802  Diet gluten free Room service appropriate? Yes; Fluid consistency: Thin; Fluid restriction: 1200 mL Fluid  Diet effective now       Question Answer Comment  Room service appropriate? Yes   Fluid consistency: Thin   Fluid restriction: 1200 mL Fluid  02/27/23 1801            Subjective:  No complaints at rest.  Examination:  General exam: Appears calm and comfortable  Respiratory system: Clear to auscultation. Respiratory effort normal. Cardiovascular system: S1 &  S2 heard, RRR. No JVD, murmurs, rubs, gallops or clicks. No pedal edema. Gastrointestinal system: Abdomen is nondistended, soft and nontender. No organomegaly or masses felt. Normal bowel sounds heard. Central nervous system: Alert and oriented. No focal neurological deficits. Extremities: Symmetric 5 x 5 power. Skin: No rashes, lesions or ulcers Psychiatry: Judgement and insight appear normal. Mood & affect appropriate.  Objective: Vitals:   03/02/23 0300 03/02/23 0400 03/02/23 0425 03/02/23 0500  BP: (!) 128/28 (!) 116/29  (!) 109/27  Pulse: 76 70  67  Resp: 20 17  18   Temp:   97.6 F (36.4 C)   TempSrc:   Oral   SpO2: 90% 96%  96%  Weight:      Height:        Intake/Output Summary (Last 24 hours) at 03/02/2023 0741 Last data filed at 03/02/2023 0650 Gross per 24 hour  Intake 600 ml  Output 4025 ml  Net -3425 ml   Filed Weights   02/27/23 0857 02/28/23 0600 03/01/23 0500  Weight: 71.2 kg 72.8 kg 73.3 kg    Scheduled Meds:  albuterol  2.5 mg Nebulization TID   aspirin EC  81 mg Oral Daily   atorvastatin  80 mg Oral QHS   Chlorhexidine Gluconate Cloth  6 each Topical Q2000   furosemide  40 mg Intravenous BID   hydrALAZINE  25 mg Oral BID   isosorbide mononitrate  15 mg Oral Daily   metoprolol succinate  12.5 mg Oral Daily   mouth rinse  15 mL Mouth Rinse 4 times per day   pantoprazole  40 mg Oral Daily   ranolazine  1,000 mg Oral BID   traZODone  25 mg Oral QHS   Continuous Infusions:  sodium chloride 10 mL/hr at 02/28/23 1807    Nutritional status     Body mass index is 25.31 kg/m.  Data Reviewed:   CBC: Recent Labs  Lab 02/27/23 0920 02/28/23 0306 03/01/23 0306 03/02/23 0310  WBC 11.7* 8.6 8.3 8.7  NEUTROABS 9.4*  --   --   --   HGB 11.2* 9.7* 10.0* 10.6*  HCT 32.0* 28.0* 29.0* 29.9*  MCV 97.0 97.6 97.6 95.5  PLT 134* 129* 140* 153   Basic Metabolic Panel: Recent Labs  Lab 02/27/23 0920 02/27/23 1949 02/28/23 0306 03/01/23 0306  03/02/23 0310  NA 128*  --  132* 134* 131*  K 4.0  --  4.0 3.5 3.6  CL 98  --  103 100 96*  CO2 20*  --  21* 25 28  GLUCOSE 138*  --  105* 115* 144*  BUN 15  --  15 24* 26*  CREATININE 0.83  --  0.94 1.13 1.20  CALCIUM 8.4*  --  7.9* 8.2* 8.0*  MG  --  1.8  --   --   --   PHOS  --   --  4.2  --   --    GFR: Estimated Creatinine Clearance: 48.2 mL/min (by C-G formula based on SCr of 1.2 mg/dL). Liver Function Tests: Recent Labs  Lab 02/27/23 0920 02/28/23 0306  AST 28 30  ALT 39 39  ALKPHOS 59 46  BILITOT 1.4* 1.0  PROT 6.2* 5.1*  ALBUMIN 3.2* 2.5*   No results for  input(s): "LIPASE", "AMYLASE" in the last 168 hours. No results for input(s): "AMMONIA" in the last 168 hours. Coagulation Profile: No results for input(s): "INR", "PROTIME" in the last 168 hours. Cardiac Enzymes: No results for input(s): "CKTOTAL", "CKMB", "CKMBINDEX", "TROPONINI" in the last 168 hours. BNP (last 3 results) No results for input(s): "PROBNP" in the last 8760 hours. HbA1C: Recent Labs    02/28/23 1002  HGBA1C 5.3   CBG: Recent Labs  Lab 03/01/23 0758  GLUCAP 115*   Lipid Profile: No results for input(s): "CHOL", "HDL", "LDLCALC", "TRIG", "CHOLHDL", "LDLDIRECT" in the last 72 hours. Thyroid Function Tests: Recent Labs    02/28/23 1002  TSH 1.442  FREET4 1.54*   Anemia Panel: No results for input(s): "VITAMINB12", "FOLATE", "FERRITIN", "TIBC", "IRON", "RETICCTPCT" in the last 72 hours. Sepsis Labs: Recent Labs  Lab 02/27/23 1005 02/27/23 1210 02/27/23 1213 02/28/23 0306  PROCALCITON  --   --  <0.10 <0.10  LATICACIDVEN 0.9 0.8  --   --     Recent Results (from the past 240 hour(s))  Blood culture (routine x 2)     Status: None (Preliminary result)   Collection Time: 02/27/23 10:05 AM   Specimen: Right Antecubital; Blood  Result Value Ref Range Status   Specimen Description   Final    RIGHT ANTECUBITAL BLOOD Performed at [facility], [address]  [place], [place] [address]    Special Requests   Final    BOTTLES DRAWN AEROBIC AND ANAEROBIC Blood Culture adequate volume Performed at [facility], [address] [place], [place] [address]    Culture   Final    NO GROWTH 3 DAYS Performed at [facility], [address] [place], [place] [address]    Report Status PENDING  Incomplete  Blood culture (routine x 2)     Status: None (Preliminary result)   Collection Time: 02/27/23 10:06 AM   Specimen: BLOOD LEFT ARM  Result Value Ref Range Status   Specimen Description   Final    BLOOD LEFT ARM Performed at [facility], [address], [place], [place] [address]    Special Requests   Final    BOTTLES DRAWN AEROBIC AND ANAEROBIC Blood Culture adequate volume Performed at [facility], [address], [place], [place] [address]    Culture   Final    NO GROWTH 3 DAYS Performed at [facility], [address] [place], [place] [address]    Report Status PENDING  Incomplete  Resp panel by RT-PCR (RSV, Flu A&B, Covid) Anterior Nasal Swab     Status: None   Collection Time: 02/27/23 12:43 PM   Specimen: Anterior Nasal Swab  Result Value Ref Range Status   SARS Coronavirus 2 by RT PCR NEGATIVE NEGATIVE Final    Comment: (NOTE) SARS-CoV-2 target nucleic acids are NOT DETECTED.  The SARS-CoV-2 RNA is generally detectable in upper respiratory specimens during the acute phase of infection. The lowest concentration of SARS-CoV-2 viral copies this assay can detect is 138 copies/mL. A negative result does not preclude SARS-Cov-2 infection and should not be used as the sole basis for treatment or other patient management decisions. A negative result may occur with  improper specimen collection/handling, submission of specimen other than nasopharyngeal swab, presence of viral mutation(s) within the areas targeted by this assay, and inadequate number of viral copies(<138  copies/mL). A negative result must be combined with clinical observations, patient history, and epidemiological  information. The expected result is Negative.  Fact Sheet for Patients:  BloggerCourse.com  Fact Sheet for Healthcare Providers:  SeriousBroker.it  This test is no t yet approved or cleared by the [place] FDA and  has been authorized for detection and/or diagnosis of SARS-CoV-2 by FDA under an Emergency Use Authorization (EUA). This EUA will remain  in effect (meaning this test can be used) for the duration of the COVID-19 declaration under Section 564(b)(1) of the Act, 21 U.S.C.section 360bbb-3(b)(1), unless the authorization is terminated  or revoked sooner.       Influenza A by PCR NEGATIVE NEGATIVE Final   Influenza B by PCR NEGATIVE NEGATIVE Final    Comment: (NOTE) The Xpert Xpress SARS-CoV-2/FLU/RSV plus assay is intended as an aid in the diagnosis of influenza from Nasopharyngeal swab specimens and should not be used as a sole basis for treatment. Nasal washings and aspirates are unacceptable for Xpert Xpress SARS-CoV-2/FLU/RSV testing.  Fact Sheet for Patients: BloggerCourse.com  Fact Sheet for Healthcare Providers: SeriousBroker.it  This test is not yet approved or cleared by the [place] FDA and has been authorized for detection and/or diagnosis of SARS-CoV-2 by FDA under an Emergency Use Authorization (EUA). This EUA will remain in effect (meaning this test can be used) for the duration of the COVID-19 declaration under Section 564(b)(1) of the Act, 21 U.S.C. section 360bbb-3(b)(1), unless the authorization is terminated or revoked.     Resp Syncytial Virus by PCR NEGATIVE NEGATIVE Final    Comment: (NOTE) Fact Sheet for Patients: BloggerCourse.com  Fact Sheet for Healthcare  Providers: SeriousBroker.it  This test is not yet approved or cleared by the [place] FDA and has been authorized for detection and/or diagnosis of SARS-CoV-2 by FDA under an Emergency Use Authorization (EUA). This EUA will remain in effect (meaning this test can be used) for the duration of the COVID-19 declaration under Section 564(b)(1) of the Act, 21 U.S.C. section 360bbb-3(b)(1), unless the authorization is terminated or revoked.  Performed at [facility], [address] [place], [place] [address]   Respiratory (~20 pathogens) panel by PCR     Status: None   Collection Time: 02/27/23 12:43 PM   Specimen: Nasopharyngeal Swab; Respiratory  Result Value Ref Range Status   Adenovirus NOT DETECTED NOT DETECTED Final   Coronavirus 229E NOT DETECTED NOT DETECTED Final    Comment: (NOTE) The Coronavirus on the Respiratory Panel, DOES NOT test for the novel  Coronavirus (2019 nCoV)    Coronavirus HKU1 NOT DETECTED NOT DETECTED Final   Coronavirus NL63 NOT DETECTED NOT DETECTED Final   Coronavirus OC43 NOT DETECTED NOT DETECTED Final   Metapneumovirus NOT DETECTED NOT DETECTED Final   Rhinovirus / Enterovirus NOT DETECTED NOT DETECTED Final   Influenza A NOT DETECTED NOT DETECTED Final   Influenza B NOT DETECTED NOT DETECTED Final   Parainfluenza Virus 1 NOT DETECTED NOT DETECTED Final   Parainfluenza Virus 2 NOT DETECTED NOT DETECTED Final   Parainfluenza Virus 3 NOT DETECTED NOT DETECTED Final   Parainfluenza Virus 4 NOT DETECTED NOT DETECTED Final   Respiratory Syncytial Virus NOT DETECTED NOT DETECTED Final   Bordetella pertussis NOT DETECTED NOT DETECTED Final   Bordetella Parapertussis NOT DETECTED NOT DETECTED Final   Chlamydophila pneumoniae NOT DETECTED NOT DETECTED Final   Mycoplasma pneumoniae NOT DETECTED NOT DETECTED Final    Comment: Performed at [facility], [address], [place], [place] [address]  MRSA  Next Gen by PCR,  Nasal     Status: None   Collection Time: 02/28/23  8:57 AM   Specimen: Nasal Mucosa; Nasal Swab  Result Value Ref Range Status   MRSA by PCR Next Gen NOT DETECTED NOT DETECTED Final    Comment: (NOTE) The GeneXpert MRSA Assay (FDA approved for NASAL specimens only), is one component of a comprehensive MRSA colonization surveillance program. It is not intended to diagnose MRSA infection nor to guide or monitor treatment for MRSA infections. Test performance is not FDA approved in patients less than 59 years old. Performed at [facility] [address], [place], [place] [address]          Radiology Studies: DG CHEST PORT 1 VIEW  Result Date: 03/01/2023 CLINICAL DATA:  Pulmonary edema, shortness of breath with cough. EXAM: PORTABLE CHEST 1 VIEW COMPARISON:  Chest x-ray 09/28/2022 FINDINGS: Left perihilar airspace opacities are present. There are small bilateral pleural effusions. The cardiomediastinal silhouette is within normal limits. No acute fractures. IMPRESSION: 1. Left perihilar airspace opacities concerning for pneumonia. 2. Small bilateral pleural effusions. Electronically Signed   By: Darliss Cheney M.D.   On: 03/01/2023 16:23   ECHOCARDIOGRAM COMPLETE  Result Date: 02/28/2023    ECHOCARDIOGRAM REPORT   Patient Name:   Randy Duncan Date of Exam: 02/28/2023 Medical Rec #:  [id number]        Height:       67.0 in Accession #:    [id number]       Weight:       160.5 lb Date of Birth:  11-02-1945       BSA:          1.842 m Patient Age:    76 years         BP:           127/17 mmHg Patient Gender: M                HR:           83 bpm. Exam Location:  Inpatient Procedure: 2D Echo, Cardiac Doppler and Color Doppler Indications:    CHF Diastolic  History:        Patient has prior history of Echocardiogram examinations. CAD,                 Signs/Symptoms:Fatigue; Risk Factors:Hypertension.                 Aortic Valve: 21 mm Magna pericardial valve  is present in the                 aortic position. Procedure Date: 11/2006.  Sonographer:    Milda Smart Referring Phys: [id number] JESSICA U VANN IMPRESSIONS  1. Left ventricular ejection fraction, by estimation, is 60 to 65%. The left ventricle has normal function. The left ventricle has no regional wall motion abnormalities. Left ventricular diastolic parameters are indeterminate.  2. Right ventricular systolic function is normal. The right ventricular size is mildly enlarged.  3. Left atrial size was mild to moderately dilated.  4. Right atrial size was moderately dilated.  5. The mitral valve is degenerative. Mild mitral valve regurgitation. No evidence of mitral stenosis. Moderate mitral annular calcification.  6. Tricuspid valve regurgitation is mild to moderate.  7. The aortic valve has been repaired/replaced. Aortic valve regurgitation is severe. Mild aortic valve stenosis. There is a 21 mm Magna pericardial valve present in the aortic position. Procedure Date: 11/2006. Aortic regurgitation PHT measures 147 msec.  Aortic valve area, by VTI measures 1.46 cm. Aortic valve mean gradient measures 20.0 mmHg. Aortic valve Vmax measures 3.08 m/s.  8. The inferior vena cava is dilated in size with <50% respiratory variability, suggesting right atrial pressure of 15 mmHg. Conclusion(s)/Recommendation(s): There is mild AS and severe AI over bioprosthetic AVR. Suggest TEE to further evalaute. FINDINGS  Left Ventricle: Left ventricular ejection fraction, by estimation, is 60 to 65%. The left ventricle has normal function. The left ventricle has no regional wall motion abnormalities. The left ventricular internal cavity size was normal in size. There is  no left ventricular hypertrophy. Left ventricular diastolic parameters are indeterminate. Right Ventricle: The right ventricular size is mildly enlarged. No increase in right ventricular wall thickness. Right ventricular systolic function is normal. Left Atrium: Left  atrial size was mild to moderately dilated. Right Atrium: Right atrial size was moderately dilated. Pericardium: There is no evidence of pericardial effusion. Mitral Valve: The mitral valve is degenerative in appearance. Moderate mitral annular calcification. Mild mitral valve regurgitation. No evidence of mitral valve stenosis. Tricuspid Valve: The tricuspid valve is normal in structure. Tricuspid valve regurgitation is mild to moderate. No evidence of tricuspid stenosis. Aortic Valve: The aortic valve has been repaired/replaced. Aortic valve regurgitation is severe. Aortic regurgitation PHT measures 147 msec. Mild aortic stenosis is present. Aortic valve mean gradient measures 20.0 mmHg. Aortic valve peak gradient measures 37.9 mmHg. Aortic valve area, by VTI measures 1.46 cm. There is a 21 mm Magna pericardial valve present in the aortic position. Procedure Date: 11/2006. Pulmonic Valve: The pulmonic valve was normal in structure. Pulmonic valve regurgitation is trivial. No evidence of pulmonic stenosis. Aorta: The aortic root is normal in size and structure. Venous: The inferior vena cava is dilated in size with less than 50% respiratory variability, suggesting right atrial pressure of 15 mmHg. IAS/Shunts: No atrial level shunt detected by color flow Doppler. Additional Comments: There is pleural effusion in the left lateral region.  LEFT VENTRICLE PLAX 2D LVIDd:         4.80 cm      Diastology LVIDs:         3.00 cm      LV e' medial:    6.20 cm/s LV PW:         0.80 cm      LV E/e' medial:  21.8 LV IVS:        0.80 cm      LV e' lateral:   4.90 cm/s LVOT diam:     1.90 cm      LV E/e' lateral: 27.6 LV SV:         93 LV SV Index:   50 LVOT Area:     2.84 cm  LV Volumes (MOD) LV vol d, MOD A2C: 91.7 ml LV vol d, MOD A4C: 100.0 ml LV vol s, MOD A2C: 32.8 ml LV vol s, MOD A4C: 32.6 ml LV SV MOD A2C:     58.9 ml LV SV MOD A4C:     100.0 ml LV SV MOD BP:      62.8 ml RIGHT VENTRICLE             IVC RV S prime:      12.10 cm/s  IVC diam: 2.00 cm TAPSE (M-mode): 1.9 cm LEFT ATRIUM             Index        RIGHT ATRIUM  Index LA diam:        3.80 cm 2.06 cm/m   RA Area:     16.80 cm LA Vol (A2C):   44.5 ml 24.16 ml/m  RA Volume:   47.40 ml  25.74 ml/m LA Vol (A4C):   39.2 ml 21.28 ml/m LA Biplane Vol: 43.8 ml 23.78 ml/m  AORTIC VALVE AV Area (Vmax):    1.53 cm AV Area (Vmean):   1.50 cm AV Area (VTI):     1.46 cm AV Vmax:           308.00 cm/s AV Vmean:          208.000 cm/s AV VTI:            0.635 m AV Peak Grad:      37.9 mmHg AV Mean Grad:      20.0 mmHg LVOT Vmax:         166.00 cm/s LVOT Vmean:        110.000 cm/s LVOT VTI:          0.328 m LVOT/AV VTI ratio: 0.52 AI PHT:            147 msec  AORTA Ao Root diam: 2.80 cm Ao Asc diam:  3.20 cm MITRAL VALVE                TRICUSPID VALVE MV Area (PHT): 4.29 cm     TR Peak grad:   51.3 mmHg MV Decel Time: 177 msec     TR Mean grad:   36.0 mmHg MR Peak grad: 118.8 mmHg    TR Vmax:        358.00 cm/s MR Mean grad: 75.5 mmHg     TR Vmean:       291.0 cm/s MR Vmax:      545.00 cm/s MR Vmean:     411.5 cm/s    SHUNTS MV E velocity: 135.00 cm/s  Systemic VTI:  0.33 m                             Systemic Diam: 1.90 cm Arvilla Meres MD Electronically signed by Arvilla Meres MD Signature Date/Time: 02/28/2023/4:51:21 PM    Final            LOS: 3 days   Time spent= 35 mins    Unnamed Hino Joline Maxcy, MD [facility]  If 7PM-7AM, please contact night-coverage  03/02/2023, 7:41 AM

## 2023-03-02 NOTE — Consult Note (Incomplete)
[facility]ASC[facility]ENTER   MULTIDISCIPLINARY HEART VALVE TEAM  Cardiology Consultation:   Patient ID: Randy Duncan MRN: [id number]; DOB: 1945-11-17  Admit date: 02/27/2023 Date of Consult: 03/03/2023  Primary Care Provider: Tracey Harries, MD [facility] Cardiologist: Charlton Haws, MD  [facility] [place] [facility] Electrophysiologist:  None    Patient Profile:   Randy Duncan is a 77 y.o. 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 Eden Emms.  History of Present Illness:   Randy Duncan lives in [place], [place] with his wife. He has three children. He is an avid golfer and has not been able to golf recently due to lifestyle limiting symptoms of shortness of breath, fatigue, palpitations. Of note, he has poor dentition and only a few remaining native teeth.   He has a history of an ascending aorta repair and AVR with a 21 mm Edwards Magna stented bioprosthetic valve in 2008 at [place] with Dr. Lequita Halt. He has done well and remained active since that time. Echo 11/11/22 showed EF 65% and normally functioning prosthetic valve well seated with a mean gradient of 11 mmHg and no reported AR.  In early May he developed general malaise, shortness of breath, palpitation and exercise intolerance. CT angio of chest 01/26/23 with no PE, acute infiltrate or effusion. Nuclear stress test 01/28/23 was borderline for demonstration of inducible ischemia in the inferior region. The patient subsequently had a cardiac catheterization on 02/04/23 with Dr. Ivan Croft which showed a 55% proximal diagonal lesion, 30% mid RCA lesion and otherwise no significant obstruction, but did show an elevated left ventricular end-diastolic pressure with hypertension. Of note, the first diagonal lesion was thought to be 90% obstructed  on prior cardiac catheterization in 2020 but now reduced to 50 to 60%. He then underwent outpatient titration of BP meds (several medication intolerances noted.)  He was most recently seen by Dr. Fransisco Beau at [facility] on 02/21/23 and complained of worsening exercise intolerance, dyspnea as well as palpitations. Given recent reassuring cardiac/pulmonary testing, he was reassured and plans were made for a holter monitor.   Saw PCP 02/22/23 with multiple complaints including dyspnea, polyuria and polydipsia. PFTs and referral to pulm/endocrinology ordered.  He then presented to [facility] ED on 02/27/23 for evaluation of fever and cough, chest pain as well as chronic polyuria and polydipsia. Found to have an elevated BNP and pulmonary vascular congestion on CXR and CTA. No PE. Infectious process not favored. He was started on broad spectrum abx and IV Lasix/BIPAP. Echo 02/28/23 showed EF 60-65%, mild RVE, mild MR, mild to mod TR, mild AS (mean gradient 20mm hg, AVA 1.46cm2) with severe bioprosthetic AR with a PHT of . Blood cultures negative and no evidence of bacterial endocarditis He was felt to more likely have calcified degeneration with prolapsed leaflet. TEE recommended and he was transferred to [facility] for TEE and structural heart consultation.   He is seen lying in bed with wife at bedside. He is breathing much better since diuresis. He is so thankful to have found a diagnosis and potential treatment option. He is aware of his options being open surgical replacement vs valve in valve TAVR and strongly favors TAVR.   Past Medical History:  Diagnosis Date   Abdominal bloating    Celiac disease  Fatigue    Hypersomnia with sleep apnea, unspecified 06/22/2013   Hypoglycemia    Impaired fasting glucose    Sleep disorder    Snoring    Snoring 06/22/2013    Past Surgical History:  Procedure Laterality Date   AORTIC VALVE REPAIR  11/2006   CATARACT EXTRACTION Bilateral 2010    TUMOR EXCISION     Left parotid gland ([place] in [employer]     Home Medications:  Prior to Admission medications   Medication Sig Start Date End Date Taking? Authorizing Provider  aspirin EC 81 MG tablet Take 81 mg by mouth in the morning. Swallow whole.   Yes [provider]  atorvastatin (LIPITOR) 80 MG tablet Take 80 mg by mouth at bedtime.   Yes [provider]  Cholecalciferol (VITAMIN D3) 125 MCG (5000 UT) CAPS Take 5,000 Units by mouth in the morning.   Yes [provider]  clonazePAM (KLONOPIN) 0.5 MG tablet Take 0.25 mg by mouth every 4 (four) hours. 05/10/13  Yes [provider]  guaiFENesin-dextromethorphan (ROBITUSSIN DM) 100-10 MG/5ML syrup Take 5 mLs by mouth every 4 (four) hours as needed for cough.   Yes [provider]  hydrALAZINE (APRESOLINE) 25 MG tablet Take 25 mg by mouth in the morning and at bedtime. 02/17/23  Yes [provider]  metoprolol succinate (TOPROL-XL) 25 MG 24 hr tablet Take 12.5 mg by mouth at bedtime.   Yes [provider]  ranolazine (RANEXA) 1000 MG SR tablet Take 1,000 mg by mouth in the morning and at bedtime.   Yes [provider]  ZYRTEC ALLERGY 10 MG tablet Take 10 mg by mouth See admin instructions. Take 10 mg by mouth in the evening-bedtime, along with the 2nd daily dose of Hydralazine   Yes [provider]    Inpatient Medications: Scheduled Meds:  albuterol  2.5 mg Nebulization TID   aspirin EC  81 mg Oral Daily   atorvastatin  80 mg Oral QHS   Chlorhexidine Gluconate Cloth  6 each Topical Q2000   furosemide  40 mg Oral Daily   hydrALAZINE  25 mg Oral BID   isosorbide mononitrate  15 mg Oral Daily   metoprolol succinate  12.5 mg Oral Daily   mouth rinse  15 mL Mouth Rinse 4 times per day   pantoprazole  40 mg Oral Daily   ranolazine  1,000 mg Oral BID   traZODone  25 mg Oral QHS   Continuous Infusions:  sodium chloride 10 mL/hr at 02/28/23 1807   PRN  Meds: sodium chloride, acetaminophen **OR** acetaminophen, alum & mag hydroxide-simeth, clonazePAM, docusate sodium, guaiFENesin, hydrALAZINE, ipratropium-albuterol, metoprolol tartrate, nitroGLYCERIN, ondansetron **OR** ondansetron (ZOFRAN) IV, mouth rinse, polyethylene glycol  Allergies:    Allergies  Allergen Reactions   Losartan Potassium Itching and Swelling   Wheat Rash and Other (See Comments)    Celiac disease   Naproxen Other (See Comments)    Sweats and stomach upset     Amlodipine    Coreg [Carvedilol]    Hydralazine Other (See Comments)    Cannot tolerate this at 3 times a day    Lisinopril    Bupropion Nausea Only and Other (See Comments)    Upset stomach   Cephalexin Nausea Only and Other (See Comments)    upset stomach    Social History:   Social History   Socioeconomic History   Marital status: Married    Spouse name: Randy Duncan   Number of children: 3  Years of education: 33   Highest education level: Not on file  Occupational History    Employer: [employer]  Tobacco Use   Smoking status: Former    Packs/day: 1.00    Years: 40.00    Additional pack years: 0.00    Total pack years: 40.00    Types: Cigarettes   Smokeless tobacco: Never   Tobacco comments:    quit 02/2012  Vaping Use   Vaping Use: Never used  Substance and Sexual Activity   Alcohol use: No    Comment: quit in 2006   Drug use: No   Sexual activity: Not on file  Other Topics Concern   Not on file  Social History Narrative   Patient is right handed, consumes 1 soda daily,resides with wife and 2 adult children.   Social Determinants of Health   Financial Resource Strain: Not on file  Food Insecurity: No Food Insecurity (03/02/2023)   Hunger Vital Sign    Worried About Running Out of Food in the Last Year: Never true    Ran Out of Food in the Last Year: Never true  Transportation Needs: No Transportation Needs (03/02/2023)   PRAPARE - Administrator, Civil Service (Medical):  No    Lack of Transportation (Non-Medical): No  Physical Activity: Not on file  Stress: Not on file  Social Connections: Not on file  Intimate Partner Violence: Not At Risk (03/02/2023)   Humiliation, Afraid, Rape, and Kick questionnaire    Fear of Current or Ex-Partner: No    Emotionally Abused: No    Physically Abused: No    Sexually Abused: No    Family History:   Family History  Problem Relation Age of Onset   Emphysema Maternal Grandfather    Celiac disease Daughter    Lung disease Neg Hx      ROS:  lease see the history of present illness.  All other ROS reviewed and negative.     Physical Exam/Data:   Vitals:   03/03/23 0100 03/03/23 0400 03/03/23 0731 03/03/23 0805  BP:  (!) 117/44  (!) 126/46  Pulse:  67  74  Resp:  15 20 18   Temp:  98 F (36.7 C)  97.6 F (36.4 C)  TempSrc:  Oral  Oral  SpO2:  93%  94%  Weight: 67.3 kg     Height:        Intake/Output Summary (Last 24 hours) at 03/03/2023 0917 Last data filed at 03/03/2023 0556 Gross per 24 hour  Intake 680 ml  Output 2550 ml  Net -1870 ml      03/03/2023    1:00 AM 03/02/2023   11:25 AM 03/01/2023    5:00 AM  Last 3 Weights  Weight (lbs) 148 lb 5.9 oz 162 lb 14.7 oz 161 lb 9.6 oz  Weight (kg) 67.3 kg 73.9 kg 73.3 kg     Body mass index is 23.24 kg/m.  General:  Well nourished, well developed, in no acute distress HEENT: normal Lymph: no adenopathy Neck: no JVD Endocrine:  No thryomegaly Vascular: No carotid bruits; FA pulses 2+ bilaterally without bruits  Cardiac:  normal S1, S2; RRR; 3/6 diastolic murmur heard best at RUSB Lungs:  clear to auscultation bilaterally, no wheezing, rhonchi or rales  Abd: soft, nontender, no hepatomegaly  Ext: no edema Musculoskeletal:  No deformities, BUE and BLE strength normal and equal Skin: warm and dry, bandage on nose.  Neuro:  CNs 2-12 intact, no focal abnormalities  noted Psych:  Normal affect   EKG:  The EKG was personally reviewed and demonstrates:   sinus with non specific ST/TW abnormalities. Anteroseptal q waves, HR 82,  Telemetry:  Telemetry was personally reviewed and demonstrates:  sinus   Relevant CV Studies: ECHO 02/28/23 IMPRESSIONS  1. Left ventricular ejection fraction, by estimation, is 60 to 65%. The  left ventricle has normal function. The left ventricle has no regional  wall motion abnormalities. Left ventricular diastolic parameters are  indeterminate.   2. Right ventricular systolic function is normal. The right ventricular  size is mildly enlarged.   3. Left atrial size was mild to moderately dilated.   4. Right atrial size was moderately dilated.   5. The mitral valve is degenerative. Mild mitral valve regurgitation. No  evidence of mitral stenosis. Moderate mitral annular calcification.   6. Tricuspid valve regurgitation is mild to moderate.   7. The aortic valve has been repaired/replaced. Aortic valve  regurgitation is severe. Mild aortic valve stenosis. There is a 21 mm  Magna pericardial valve present in the aortic position. Procedure Date:  11/2006. Aortic regurgitation PHT measures 147  msec. Aortic valve area, by VTI measures 1.46 cm. Aortic valve mean  gradient measures 20.0 mmHg. Aortic valve Vmax measures 3.08 m/s.   8. The inferior vena cava is dilated in size with <50% respiratory  variability, suggesting right atrial pressure of 15 mmHg.   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 Suggest TEE to further evalaute.   _________________________  Heart cath  02/04/2023 (done at [facility], images in [facility])  Hemodynamics:  1. Central Aortic Pressure -140/50 mmHg. Mean 87 mmHg 2. Left Ventricular Pressure-166/9 mmHg Additional comments on on hemodynamics:  LVEDP 18 mmHg.  Peak to peak aortic valve gradient 26 mmHg.  Coronary Angiography:  Antatomically right dominant Mild fluoroscopic coronary calcification   Left Main: Normal Left anterior  descending artery: Mild irregularities. No focal angiographic or obstructive stenosis Diagonal branches: 2 diagonal branches. First diagonal branch has an upper takeoff and has an ostial stenosis that appears to be 50 to 60%. The vessel appears to be larger than the time of his previous catheterization 4 years ago. There is TIMI-3 flow in the diagonal branch. Second diagonal branch has mild irregularities Left Circumflex: Anatomically nondominant. Minor luminal irregularities. No focal mammographic obstructive stenosis Obtuse Marginal branches: Mild irregularities Right Coronary Artery: Anatomically dominant. Large vessel. Ectasia in the midsegment. 30% stenosis in the midsegment. Mild irregularities proximally and distally. No focal angiographic or obstructive stenosis. Posterior Descending Artery: Mild irregularities Posterolateral ventricular branch: Moderate-sized vessel. Mild irregularities  Laboratory Data:  High Sensitivity Troponin:   Recent Labs  Lab 02/27/23 0920 02/27/23 1210 02/27/23 1949 02/27/23 2227  TROPONINIHS 45* 50* 101* 91*     Chemistry Recent Labs  Lab 03/01/23 0306 03/02/23 0310 03/03/23 0422  NA 134* 131* 133*  K 3.5 3.6 3.9  CL 100 96* 99  CO2 25 28 27   GLUCOSE 115* 144* 121*  BUN 24* 26* 19  CREATININE 1.13 1.20 1.06  CALCIUM 8.2* 8.0* 8.2*  GFRNONAA >60 >60 >60  ANIONGAP 9 7 7     Recent Labs  Lab 02/27/23 0920 02/28/23 0306  PROT 6.2* 5.1*  ALBUMIN 3.2* 2.5*  AST 28 30  ALT 39 39  ALKPHOS 59 46  BILITOT 1.4* 1.0   Hematology Recent Labs  Lab 03/01/23 0306 03/02/23 0310 03/02/23 0802  WBC 8.3 8.7 10.0  RBC 2.97* 3.13* 3.29*  HGB 10.0* 10.6* 11.0*  HCT 29.0* 29.9* 31.4*  MCV 97.6 95.5 95.4  MCH 33.7 33.9 33.4  MCHC 34.5 35.5 35.0  RDW 11.9 11.7 11.6  PLT 140* 153 175   BNP Recent Labs  Lab 02/27/23 0920  BNP 744.7*    DDimer No results for input(s): "DDIMER" in the last 168 hours.   Radiology/Studies:  DG CHEST PORT 1  VIEW  Result Date: 03/01/2023 CLINICAL DATA:  Pulmonary edema, shortness of breath with cough. EXAM: PORTABLE CHEST 1 VIEW COMPARISON:  Chest x-ray 09/28/2022 FINDINGS: Left perihilar airspace opacities are present. There are small bilateral pleural effusions. The cardiomediastinal silhouette is within normal limits. No acute fractures. IMPRESSION: 1. Left perihilar airspace opacities concerning for pneumonia. 2. Small bilateral pleural effusions. Electronically Signed   By: Darliss Cheney M.D.   On: 03/01/2023 16:23   ECHOCARDIOGRAM COMPLETE  Result Date: 02/28/2023    ECHOCARDIOGRAM REPORT   Patient Name:   Randy Duncan Date of Exam: 02/28/2023 Medical Rec #:  [id number]        Height:       67.0 in Accession #:    [id number]       Weight:       160.5 lb Date of Birth:  Nov 03, 1945       BSA:          1.842 m Patient Age:    76 years         BP:           127/17 mmHg Patient Gender: M                HR:           83 bpm. Exam Location:  Inpatient Procedure: 2D Echo, Cardiac Doppler and Color Doppler Indications:    CHF Diastolic  History:        Patient has prior history of Echocardiogram examinations. CAD,                 Signs/Symptoms:Fatigue; Risk Factors:Hypertension.                 Aortic Valve: 21 mm Magna pericardial valve is present in the                 aortic position. Procedure Date: 11/2006.  Sonographer:    Milda Smart Referring Phys: [id number] JESSICA U VANN IMPRESSIONS  1. Left ventricular ejection fraction, by estimation, is 60 to 65%. The left ventricle has normal function. The left ventricle has no regional wall motion abnormalities. Left ventricular diastolic parameters are indeterminate.  2. Right ventricular systolic function is normal. The right ventricular size is mildly enlarged.  3. Left atrial size was mild to moderately dilated.  4. Right atrial size was moderately dilated.  5. The mitral valve is degenerative. Mild mitral valve regurgitation. No evidence of mitral stenosis.  Moderate mitral annular calcification.  6. Tricuspid valve regurgitation is mild to moderate.  7. The aortic valve has been repaired/replaced. Aortic valve regurgitation is severe. Mild aortic valve stenosis. There is a 21 mm Magna pericardial valve present in the aortic position. Procedure Date: 11/2006. Aortic regurgitation PHT measures 147 msec. Aortic valve area, by VTI measures 1.46 cm. Aortic valve mean gradient measures 20.0 mmHg. Aortic valve Vmax measures 3.08 m/s.  8. The inferior vena cava is dilated in size with <50% respiratory variability, suggesting right atrial pressure of 15 mmHg. Conclusion(s)/Recommendation(s): There is mild AS and severe AI over bioprosthetic AVR. Suggest  TEE to further evalaute. FINDINGS  Left Ventricle: Left ventricular ejection fraction, by estimation, is 60 to 65%. The left ventricle has normal function. The left ventricle has no regional wall motion abnormalities. The left ventricular internal cavity size was normal in size. There is  no left ventricular hypertrophy. Left ventricular diastolic parameters are indeterminate. Right Ventricle: The right ventricular size is mildly enlarged. No increase in right ventricular wall thickness. Right ventricular systolic function is normal. Left Atrium: Left atrial size was mild to moderately dilated. Right Atrium: Right atrial size was moderately dilated. Pericardium: There is no evidence of pericardial effusion. Mitral Valve: The mitral valve is degenerative in appearance. Moderate mitral annular calcification. Mild mitral valve regurgitation. No evidence of mitral valve stenosis. Tricuspid Valve: The tricuspid valve is normal in structure. Tricuspid valve regurgitation is mild to moderate. No evidence of tricuspid stenosis. Aortic Valve: The aortic valve has been repaired/replaced. Aortic valve regurgitation is severe. Aortic regurgitation PHT measures 147 msec. Mild aortic stenosis is present. Aortic valve mean gradient measures  20.0 mmHg. Aortic valve peak gradient measures 37.9 mmHg. Aortic valve area, by VTI measures 1.46 cm. There is a 21 mm Magna pericardial valve present in the aortic position. Procedure Date: 11/2006. Pulmonic Valve: The pulmonic valve was normal in structure. Pulmonic valve regurgitation is trivial. No evidence of pulmonic stenosis. Aorta: The aortic root is normal in size and structure. Venous: The inferior vena cava is dilated in size with less than 50% respiratory variability, suggesting right atrial pressure of 15 mmHg. IAS/Shunts: No atrial level shunt detected by color flow Doppler. Additional Comments: There is pleural effusion in the left lateral region.  LEFT VENTRICLE PLAX 2D LVIDd:         4.80 cm      Diastology LVIDs:         3.00 cm      LV e' medial:    6.20 cm/s LV PW:         0.80 cm      LV E/e' medial:  21.8 LV IVS:        0.80 cm      LV e' lateral:   4.90 cm/s LVOT diam:     1.90 cm      LV E/e' lateral: 27.6 LV SV:         93 LV SV Index:   50 LVOT Area:     2.84 cm  LV Volumes (MOD) LV vol d, MOD A2C: 91.7 ml LV vol d, MOD A4C: 100.0 ml LV vol s, MOD A2C: 32.8 ml LV vol s, MOD A4C: 32.6 ml LV SV MOD A2C:     58.9 ml LV SV MOD A4C:     100.0 ml LV SV MOD BP:      62.8 ml RIGHT VENTRICLE             IVC RV S prime:     12.10 cm/s  IVC diam: 2.00 cm TAPSE (M-mode): 1.9 cm LEFT ATRIUM             Index        RIGHT ATRIUM           Index LA diam:        3.80 cm 2.06 cm/m   RA Area:     16.80 cm LA Vol (A2C):   44.5 ml 24.16 ml/m  RA Volume:   47.40 ml  25.74 ml/m LA Vol (A4C):   39.2 ml 21.28 ml/m LA Biplane  Vol: 43.8 ml 23.78 ml/m  AORTIC VALVE AV Area (Vmax):    1.53 cm AV Area (Vmean):   1.50 cm AV Area (VTI):     1.46 cm AV Vmax:           308.00 cm/s AV Vmean:          208.000 cm/s AV VTI:            0.635 m AV Peak Grad:      37.9 mmHg AV Mean Grad:      20.0 mmHg LVOT Vmax:         166.00 cm/s LVOT Vmean:        110.000 cm/s LVOT VTI:          0.328 m LVOT/AV VTI ratio: 0.52 AI  PHT:            147 msec  AORTA Ao Root diam: 2.80 cm Ao Asc diam:  3.20 cm MITRAL VALVE                TRICUSPID VALVE MV Area (PHT): 4.29 cm     TR Peak grad:   51.3 mmHg MV Decel Time: 177 msec     TR Mean grad:   36.0 mmHg MR Peak grad: 118.8 mmHg    TR Vmax:        358.00 cm/s MR Mean grad: 75.5 mmHg     TR Vmean:       291.0 cm/s MR Vmax:      545.00 cm/s MR Vmean:     411.5 cm/s    SHUNTS MV E velocity: 135.00 cm/s  Systemic VTI:  0.33 m                             Systemic Diam: 1.90 cm Arvilla Meres MD Electronically signed by Arvilla Meres MD Signature Date/Time: 02/28/2023/4:51:21 PM    Final    CT Angio Chest PE W and/or Wo Contrast  Result Date: 02/27/2023 CLINICAL DATA:  Pulmonary embolism (PE) suspected, high prob EXAM: CT ANGIOGRAPHY CHEST WITH CONTRAST TECHNIQUE: Multidetector CT imaging of the chest was performed using the standard protocol during bolus administration of intravenous contrast. Multiplanar CT image reconstructions and MIPs were obtained to evaluate the vascular anatomy. RADIATION DOSE REDUCTION: This exam was performed according to the departmental dose-optimization program which includes automated exposure control, adjustment of the mA and/or kV according to patient size and/or use of iterative reconstruction technique. CONTRAST:  75mL OMNIPAQUE IOHEXOL 350 MG/ML SOLN COMPARISON:  X-ray 02/27/2023 FINDINGS: Cardiovascular: Satisfactory opacification of the pulmonary arteries to the segmental level. No evidence of pulmonary embolism. Thoracic aorta is nonaneurysmal. Aortic valve replacement. Scattered atherosclerotic vascular calcifications of the aorta and coronary arteries. Normal heart size. No pericardial effusion. Mediastinum/Nodes: Mildly prominent mediastinal lymph nodes including 1.1 cm precarinal node (series 4, image 56) and 1.3 cm AP window node (series 4, image 50). Suspect mildly prominent bilateral hilar lymph nodes, evaluation of which is limited in the  presence of dense perihilar opacities. No axillary lymphadenopathy. Thyroid gland, trachea, and esophagus demonstrate no significant abnormality. Lungs/Pleura: Moderate layering bilateral pleural effusions with compressive atelectasis of the bilateral lower lobes. Dense airspace consolidations within the bilateral perihilar regions and within the right upper lobe. Surrounding ground-glass opacity. Diffuse interlobular septal thickening. No pneumothorax. Upper Abdomen: No acute abnormality. Musculoskeletal: Prior sternotomy. No acute bony abnormality. No chest wall abnormality. Review of the MIP images confirms the above findings.  IMPRESSION: 1. No evidence of pulmonary embolism. 2. Dense airspace consolidations within the bilateral perihilar regions and within the right upper lobe with surrounding ground-glass opacity. Findings are favored to represent pulmonary edema. Superimposed pneumonia would be difficult to exclude. 3. Moderate layering bilateral pleural effusions with compressive atelectasis of the bilateral lower lobes. 4. Mildly prominent mediastinal and hilar lymph nodes, likely reactive. 5. Aortic and coronary artery atherosclerosis (ICD10-I70.0). Electronically Signed   By: Duanne Guess D.O.   On: 02/27/2023 12:49    Procedure Type: Isolated AVR PERIOPERATIVE OUTCOME ESTIMATE % Operative Mortality 2.3% Morbidity & Mortality 15.5% Stroke 3.1% Renal Failure 2.21% Reoperation 7.6% Prolonged Ventilation 8.58% Deep Sternal Wound Infection 0.097% Long Hospital Stay (>14 days) 9.65% Short Hospital Stay (<6 days)* 30.6%   _______________________________   [facility] Cardiomyopathy Questionnaire     03/02/2023    4:22 PM  KCCQ-12  1 a. Ability to shower/bathe Moderately limited  1 b. Ability to walk 1 block Extremely limited  1 c. Ability to hurry/jog Extremely limited  2. Edema feet/ankles/legs 1-2 times a week  3. Limited by fatigue All of the time  4. Limited by dyspnea All of  the time  5. Sitting up / on 3+ pillows Never over the past 2 weeks  6. Limited enjoyment of life Extremely limited  7. Rest of life w/ symptoms Not at all satisfied  8 a. Participation in hobbies Severely limited  8 b. Participation in chores Severely limited  8 c. Visiting family/friends Severely limited       Assessment and Plan:   TAAVI NIEMELA is a 77 y.o. male with symptoms of severe bioprosthetic aortic valve regurgitation with NYHA Class IV symptoms currently admitted with acute pulmonary edema requiring IV diuresis.  Cardiac cath at [facility] on 02/04/23 showed mild non obstructive CAD with elevated LVEDP and HTN. Images in [facility].   Echo 02/28/23 showed EF 60-65%, mild RVE, mild MR, mild to mod TR, mild AS (mean gradient 20mm hg, AVA 1.46cm2) with severe bioprosthetic AR with a PHT of . Blood cultures negative and no evidence of bacterial endocarditis He was felt to more likely have calcified degeneration with prolapsed leaflet. TEE recommended and he was transferred to [facility] for TEE and structural heart consultation.    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 We have discussed the limitations of medical therapy and the poor prognosis associated with his condition. We have reviewed potential treatment options, including palliative medical therapy, conventional surgical aortic valve replacement, and transcatheter aortic valve replacement. We discussed treatment options in the context of this patient's specific comorbid medical conditions.    [name] predicted risk of mortality with conventional aortic valve replacement is 2.3% primarily based on previous valve replacement with sternotomy, age, acute CHF. Valve in Vavle TAVR seems like a reasonable treatment option and the patient and family strongly favor this approach. We discussed typical evaluation which will require a gated cardiac CTA and a CTA of the  chest/abdomen/pelvis to evaluate both his cardiac anatomy and peripheral vasculature. This has been set up for today and TEE tomorrow.    Dr. Leafy Ro to follow.   Signed, Cline Crock, PA-C  03/03/2023 9:17 AM  Agree with above Pt with aortic valve prosthetic dysfunction with severe AI and normal LV fucntion. I agree that he requires AVR and with the previous surgery would best be served with valve in valve TAVR. Pt requires further time to  improve with medical management and diuresis. Has femoral access it appears. He would be considered for surgical intervention if some issues arise at time of TAVR.

## 2023-03-03 ENCOUNTER — Inpatient Hospital Stay (HOSPITAL_COMMUNITY): Payer: Medicare HMO

## 2023-03-03 ENCOUNTER — Encounter (HOSPITAL_COMMUNITY): Payer: Self-pay | Admitting: Internal Medicine

## 2023-03-03 ENCOUNTER — Encounter: Payer: Self-pay | Admitting: Cardiovascular Disease

## 2023-03-03 DIAGNOSIS — J810 Acute pulmonary edema: Secondary | ICD-10-CM

## 2023-03-03 DIAGNOSIS — I509 Heart failure, unspecified: Secondary | ICD-10-CM

## 2023-03-03 DIAGNOSIS — Z952 Presence of prosthetic heart valve: Secondary | ICD-10-CM | POA: Diagnosis not present

## 2023-03-03 DIAGNOSIS — I5043 Acute on chronic combined systolic (congestive) and diastolic (congestive) heart failure: Secondary | ICD-10-CM | POA: Diagnosis not present

## 2023-03-03 DIAGNOSIS — I351 Nonrheumatic aortic (valve) insufficiency: Secondary | ICD-10-CM

## 2023-03-03 DIAGNOSIS — I2510 Atherosclerotic heart disease of native coronary artery without angina pectoris: Secondary | ICD-10-CM | POA: Diagnosis not present

## 2023-03-03 DIAGNOSIS — I350 Nonrheumatic aortic (valve) stenosis: Secondary | ICD-10-CM

## 2023-03-03 DIAGNOSIS — J9601 Acute respiratory failure with hypoxia: Secondary | ICD-10-CM | POA: Diagnosis not present

## 2023-03-03 LAB — BASIC METABOLIC PANEL
Anion gap: 7 (ref 5–15)
BUN: 19 mg/dL (ref 8–23)
CO2: 27 mmol/L (ref 22–32)
Calcium: 8.2 mg/dL — ABNORMAL LOW (ref 8.9–10.3)
Chloride: 99 mmol/L (ref 98–111)
Creatinine, Ser: 1.06 mg/dL (ref 0.61–1.24)
GFR, Estimated: >60 mL/min (ref >60)
Glucose, Bld: 121 mg/dL — ABNORMAL HIGH (ref 70–99)
Potassium: 3.9 mmol/L (ref 3.5–5.1)
Sodium: 133 mmol/L — ABNORMAL LOW (ref 135–145)

## 2023-03-03 LAB — MAGNESIUM: Magnesium: 2 mg/dL (ref 1.7–2.4)

## 2023-03-03 MED ORDER — ALBUTEROL SULFATE (2.5 MG/3ML) 0.083% IN NEBU
2.5000 mg | INHALATION_SOLUTION | Freq: Two times a day (BID) | RESPIRATORY_TRACT | Status: DC
  Administered 2023-03-04: 2.5 mg via RESPIRATORY_TRACT
  Filled 2023-03-03: qty 3

## 2023-03-03 MED ORDER — FUROSEMIDE 40 MG PO TABS
40.0000 mg | ORAL_TABLET | Freq: Every day | ORAL | Status: DC
  Administered 2023-03-03 – 2023-03-05 (×2): 40 mg via ORAL
  Filled 2023-03-03 (×3): qty 1

## 2023-03-03 MED ORDER — SODIUM CHLORIDE 0.9 % IV SOLN: INTRAVENOUS | Status: DC

## 2023-03-03 MED ORDER — IOHEXOL 350 MG/ML SOLN
100.0000 mL | Freq: Once | INTRAVENOUS | Status: AC | PRN
  Administered 2023-03-03: 100 mL via INTRAVENOUS

## 2023-03-03 NOTE — Progress Notes (Signed)
Rounding Note    Patient Name: Randy Duncan Date of Encounter: 03/03/2023  [facility] Cardiologist: Charlton Haws, MD   Subjective   Now at [facility] Discussed getting cardiac CTA since he is doing well   Inpatient Medications    Scheduled Meds:  albuterol  2.5 mg Nebulization TID   aspirin EC  81 mg Oral Daily   atorvastatin  80 mg Oral QHS   Chlorhexidine Gluconate Cloth  6 each Topical Q2000   hydrALAZINE  25 mg Oral BID   isosorbide mononitrate  15 mg Oral Daily   metoprolol succinate  12.5 mg Oral Daily   mouth rinse  15 mL Mouth Rinse 4 times per day   pantoprazole  40 mg Oral Daily   ranolazine  1,000 mg Oral BID   traZODone  25 mg Oral QHS   Continuous Infusions:  sodium chloride 10 mL/hr at 02/28/23 1807   PRN Meds: sodium chloride, acetaminophen **OR** acetaminophen, alum & mag hydroxide-simeth, clonazePAM, docusate sodium, guaiFENesin, hydrALAZINE, ipratropium-albuterol, metoprolol tartrate, nitroGLYCERIN, ondansetron **OR** ondansetron (ZOFRAN) IV, mouth rinse, polyethylene glycol   Vital Signs    Vitals:   03/03/23 0100 03/03/23 0400 03/03/23 0731 03/03/23 0805  BP:  (!) 117/44  (!) 126/46  Pulse:  67  74  Resp:  15 20 18   Temp:  98 F (36.7 C)  97.6 F (36.4 C)  TempSrc:  Oral  Oral  SpO2:  93%  94%  Weight: 67.3 kg     Height:        Intake/Output Summary (Last 24 hours) at 03/03/2023 0855 Last data filed at 03/03/2023 0556 Gross per 24 hour  Intake 680 ml  Output 2550 ml  Net -1870 ml      03/03/2023    1:00 AM 03/02/2023   11:25 AM 03/01/2023    5:00 AM  Last 3 Weights  Weight (lbs) 148 lb 5.9 oz 162 lb 14.7 oz 161 lb 9.6 oz  Weight (kg) 67.3 kg 73.9 kg 73.3 kg      Telemetry    Sinus in the 70s - Personally Reviewed  ECG    No new tracings - Personally Reviewed  Physical Exam   GEN: No acute distress.   Neck: No JVD Cardiac: RRR, 2/6 systolic murmur  Respiratory: Clear to auscultation bilaterally, diminished in  right base GI: Soft, nontender, non-distended  MS: No edema; No deformity. Neuro:  Nonfocal  Psych: Normal affect   Labs    High Sensitivity Troponin:   Recent Labs  Lab 02/27/23 0920 02/27/23 1210 02/27/23 1949 02/27/23 2227  TROPONINIHS 45* 50* 101* 91*     Chemistry Recent Labs  Lab 02/27/23 0920 02/27/23 1949 02/28/23 0306 03/01/23 0306 03/02/23 0310 03/03/23 0422  NA 128*  --  132* 134* 131* 133*  K 4.0  --  4.0 3.5 3.6 3.9  CL 98  --  103 100 96* 99  CO2 20*  --  21* 25 28 27   GLUCOSE 138*  --  105* 115* 144* 121*  BUN 15  --  15 24* 26* 19  CREATININE 0.83  --  0.94 1.13 1.20 1.06  CALCIUM 8.4*  --  7.9* 8.2* 8.0* 8.2*  MG  --  1.8  --   --   --  2.0  PROT 6.2*  --  5.1*  --   --   --   ALBUMIN 3.2*  --  2.5*  --   --   --  AST 28  --  30  --   --   --   ALT 39  --  39  --   --   --   ALKPHOS 59  --  46  --   --   --   BILITOT 1.4*  --  1.0  --   --   --   GFRNONAA >60  --  >60 >60 >60 >60  ANIONGAP 10  --  8 9 7 7     Lipids No results for input(s): "CHOL", "TRIG", "HDL", "LABVLDL", "LDLCALC", "CHOLHDL" in the last 168 hours.  Hematology Recent Labs  Lab 03/01/23 0306 03/02/23 0310 03/02/23 0802  WBC 8.3 8.7 10.0  RBC 2.97* 3.13* 3.29*  HGB 10.0* 10.6* 11.0*  HCT 29.0* 29.9* 31.4*  MCV 97.6 95.5 95.4  MCH 33.7 33.9 33.4  MCHC 34.5 35.5 35.0  RDW 11.9 11.7 11.6  PLT 140* 153 175   Thyroid  Recent Labs  Lab 02/28/23 1002  TSH 1.442  FREET4 1.54*    BNP Recent Labs  Lab 02/27/23 0920  BNP 744.7*    DDimer No results for input(s): "DDIMER" in the last 168 hours.   Radiology    DG CHEST PORT 1 VIEW  Result Date: 03/01/2023 CLINICAL DATA:  Pulmonary edema, shortness of breath with cough. EXAM: PORTABLE CHEST 1 VIEW COMPARISON:  Chest x-ray 09/28/2022 FINDINGS: Left perihilar airspace opacities are present. There are small bilateral pleural effusions. The cardiomediastinal silhouette is within normal limits. No acute fractures.  IMPRESSION: 1. Left perihilar airspace opacities concerning for pneumonia. 2. Small bilateral pleural effusions. Electronically Signed   By: Darliss Cheney M.D.   On: 03/01/2023 16:23    Cardiac Studies   ECHO 02/28/23 IMPRESSIONS  1. Left ventricular ejection fraction, by estimation, is 60 to 65%. The  left ventricle has normal function. The left ventricle has no regional  wall motion abnormalities. Left ventricular diastolic parameters are  indeterminate.   2. Right ventricular systolic function is normal. The right ventricular  size is mildly enlarged.   3. Left atrial size was mild to moderately dilated.   4. Right atrial size was moderately dilated.   5. The mitral valve is degenerative. Mild mitral valve regurgitation. No  evidence of mitral stenosis. Moderate mitral annular calcification.   6. Tricuspid valve regurgitation is mild to moderate.   7. The aortic valve has been repaired/replaced. Aortic valve  regurgitation is severe. Mild aortic valve stenosis. There is a 21 mm  Magna pericardial valve present in the aortic position. Procedure Date:  11/2006. Aortic regurgitation PHT measures 147  msec. Aortic valve area, by VTI measures 1.46 cm. Aortic valve mean  gradient measures 20.0 mmHg. Aortic valve Vmax measures 3.08 m/s.   8. The inferior vena cava is dilated in size with <50% respiratory  variability, suggesting right atrial pressure of 15 mmHg.   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 Suggest TEE to further evalaute.   Patient Profile     77 y.o. male followed by Dr. Fransisco Beau with [facility] with a hx of CAD, DM2, hx of bioprosthetic aortic valve repair in 2008, hx of ascending aorta repair, centrilobular emphysema, celiac disease, hyperglycemia, sleep disorder, pancytopenia now admitted with PNA and CHF and chest pain.     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 Suggest TEE to further  evalaute.   Assessment & Plan    AS/AI s/p bioprosthetic valve  2008 TTE with severe AR, degenerated bioprosthetic valve Question calcified prolapsed leaflet Not a surgical candidate for re-do sternotomy Could consider valve-in-valve TAVR - TAVR CTA today and TEE tomorrow - will be seen by Dr Leafy Ro and [facility] today for structural   Fever No further fever BC x 2 NGTD after 3 days   Pulmonary edema Acute respiratory failure with hypoxia BNP 745 Echo with LVEF 60-65%, BAE, mild MR, AI - has diuresed with 40 mg IV lasix BID with 4L urine output yesterday - now on room air - appears euvolemic on exam - PO lasix 40 mg daily    CAD Known D1 disease with failed PCI in the past Elevated troponin felt demand ischemia Last heart cath 01/2023 at [facility] after false positive nuclear stress test showed nonobstructive disease, elevated LVEDP - on low dose imdur and ranexa 1000 mg BID, 12.5 mg toprol - continue ASA   Chest tightness Pt reports chest tightness sometimes relieved with benzodiazepine - in May, had left arm pain and numbness radiating to left chest, but on the day after playing golf - attempted to assess activity level, does not climb stairs and does not walk in big box stores, plays golf once weekly but rides golf cart - reassuring heart cath as above   Hypertension Maintained on hydralazine 25 mg BID, imdur 15 mg,   Allergy to amlodipine   Hyperlipidemia with LDL goal < 70 Continue 80 mg lipitor  Possible d/c Saturday with TAVR hopefully to be scheduled within the next 2-4 weeks     For questions or updates, please contact [facility] Please consult [email or web address] for contact info under        Signed, Charlton Haws, MD  03/03/2023, 8:55 AM

## 2023-03-03 NOTE — Progress Notes (Signed)
   03/03/23 2116  BiPAP/CPAP/SIPAP  Reason BIPAP/CPAP not in use Other(comment) (prn order)  FiO2 (%) 21 %  BiPAP/CPAP /SiPAP Vitals  SpO2 96 %  Bilateral Breath Sounds Clear;Diminished

## 2023-03-03 NOTE — Progress Notes (Signed)
PROGRESS NOTE        PATIENT DETAILS Name: Randy Duncan Age: 77 y.o. Sex: male Date of Birth: [birth year] Admit Date: 02/27/2023 Admitting Physician Ankit Joline Maxcy, MD NWG:[id number], Onalee Hua, MD  Brief Summary: Patient is a 77 y.o.  male with history of bioprosthetic aortic valve replacement in 2008, HTN, CAD-presenting with shortness of breath-found to have acute hypoxic respiratory failure due to acute on chronic HFpEF in the setting of severe aortic regurgitation  Significant events: 6/16>> admit to [facility] at [facility] 6/19>> transferred to [facility] for structural cardiology evaluation/TEE/workup  Significant studies: 616>> CXR: CHF 6/16>> CTA chest: No PE-pulm edema 6/17>> echo: EF 60-65%, severe AR  Significant microbiology data: 6/16>>/influenza/RSV PCR: Negative 6/16>> respiratory virus panel: Negative 6/16>> blood culture: No growth  Procedures: None  Consults: Cardiology  Subjective: Lying comfortably in bed-denies any chest pain or shortness of breath.  Objective: Vitals: Blood pressure (!) 126/46, pulse 74, temperature 97.6 F (36.4 C), temperature source Oral, resp. rate 18, height 5\' 7"  (1.702 m), weight 67.3 kg, SpO2 94 %.   Exam: Gen Exam:Alert awake-not in any distress HEENT:atraumatic, normocephalic Chest: B/L clear to auscultation anteriorly CVS:S1S2 regular Abdomen:soft non tender, non distended Extremities:no edema Neurology: Non focal Skin: no rash  Pertinent Labs/Radiology:    Latest Ref Rng & Units 03/02/2023    8:02 AM 03/02/2023    3:10 AM 03/01/2023    3:06 AM  CBC  WBC 4.0 - 10.5 K/uL 10.0  8.7  8.3   Hemoglobin 13.0 - 17.0 g/dL 08.6  57.8  46.9   Hematocrit 39.0 - 52.0 % 31.4  29.9  29.0   Platelets 150 - 400 K/uL 175  153  140     Lab Results  Component Value Date   NA 133 (L) 03/03/2023   K 3.9 03/03/2023   CL 99 03/03/2023   CO2 27 03/03/2023      Assessment/Plan: Acute hypoxic  respiratory failure due to HFpEF exacerbation Euvolemic-now on oral Lasix  Remains on room air.  Severe aortic regurgitation History of bioprosthetic aortic valve replacement 2008  TEE 6/21 TAVR CTA today. Cardiology and structural heart team following  CAD No anginal symptoms Aspirin/statin/metoprolol  Hyponatremia In the setting of CHF Improved-mild-follow electrolytes  HTN BP stable on Toprol/hydralazine/Imdur  Normocytic anemia Likely due to acute illness Follow CBC  OSA BiPAP nightly  Mild protein calorie malnutrition   Pressure Ulcer: Pressure Injury 02/27/23 Sacrum Stage 1 -  Intact skin with non-blanchable redness of a localized area usually over a bony prominence. (Active)  02/27/23 1739  Location: Sacrum  Location Orientation:   Staging: Stage 1 -  Intact skin with non-blanchable redness of a localized area usually over a bony prominence.  Wound Description (Comments):   Present on Admission: Yes  Dressing Type Foam - Lift dressing to assess site every shift 03/02/23 1958    BMI: Estimated body mass index is 23.24 kg/m as calculated from the following:   Height as of this encounter: 5\' 7"  (1.702 m).   Weight as of this encounter: 67.3 kg.   Code status:   Code Status: Full Code   DVT Prophylaxis: SCDs Start: 02/27/23 1621  Family Communication: Spouse at bedside  Disposition Plan: Status is: Inpatient Remains inpatient appropriate because: Severity of illness   Planned Discharge Destination:Home   Diet: Diet Order  Diet gluten free Room service appropriate? Yes with Assist; Fluid consistency: Thin; Fluid restriction: 1200 mL Fluid  Diet effective now                     Antimicrobial agents: Anti-infectives (From admission, onward)    Start     Dose/Rate Route Frequency Ordered Stop   02/27/23 1645  cefTRIAXone (ROCEPHIN) 1 g in sodium chloride 0.9 % 100 mL IVPB  Status:  Discontinued        1 g 200 mL/hr over 30  Minutes Intravenous Daily 02/27/23 1622 02/28/23 0826   02/27/23 1645  azithromycin (ZITHROMAX) 500 mg in sodium chloride 0.9 % 250 mL IVPB  Status:  Discontinued        500 mg 250 mL/hr over 60 Minutes Intravenous Daily 02/27/23 1622 02/28/23 0826   02/27/23 1300  vancomycin (VANCOREADY) IVPB 1500 mg/300 mL        1,500 mg 150 mL/hr over 120 Minutes Intravenous STAT 02/27/23 1250 02/27/23 1551   02/27/23 1300  ceFEPIme (MAXIPIME) 2 g in sodium chloride 0.9 % 100 mL IVPB        2 g 200 mL/hr over 30 Minutes Intravenous STAT 02/27/23 1250 02/27/23 1330        MEDICATIONS: Scheduled Meds:  albuterol  2.5 mg Nebulization TID   aspirin EC  81 mg Oral Daily   atorvastatin  80 mg Oral QHS   Chlorhexidine Gluconate Cloth  6 each Topical Q2000   furosemide  40 mg Oral Daily   hydrALAZINE  25 mg Oral BID   isosorbide mononitrate  15 mg Oral Daily   metoprolol succinate  12.5 mg Oral Daily   mouth rinse  15 mL Mouth Rinse 4 times per day   pantoprazole  40 mg Oral Daily   ranolazine  1,000 mg Oral BID   traZODone  25 mg Oral QHS   Continuous Infusions:  sodium chloride 10 mL/hr at 02/28/23 1807   PRN Meds:.sodium chloride, acetaminophen **OR** acetaminophen, alum & mag hydroxide-simeth, clonazePAM, docusate sodium, guaiFENesin, hydrALAZINE, ipratropium-albuterol, metoprolol tartrate, nitroGLYCERIN, ondansetron **OR** ondansetron (ZOFRAN) IV, mouth rinse, polyethylene glycol   I have personally reviewed following labs and imaging studies  LABORATORY DATA: CBC: Recent Labs  Lab 02/27/23 0920 02/28/23 0306 03/01/23 0306 03/02/23 0310 03/02/23 0802  WBC 11.7* 8.6 8.3 8.7 10.0  NEUTROABS 9.4*  --   --   --   --   HGB 11.2* 9.7* 10.0* 10.6* 11.0*  HCT 32.0* 28.0* 29.0* 29.9* 31.4*  MCV 97.0 97.6 97.6 95.5 95.4  PLT 134* 129* 140* 153 175    Basic Metabolic Panel: Recent Labs  Lab 02/27/23 0920 02/27/23 1949 02/28/23 0306 03/01/23 0306 03/02/23 0310 03/03/23 0422  NA  128*  --  132* 134* 131* 133*  K 4.0  --  4.0 3.5 3.6 3.9  CL 98  --  103 100 96* 99  CO2 20*  --  21* 25 28 27   GLUCOSE 138*  --  105* 115* 144* 121*  BUN 15  --  15 24* 26* 19  CREATININE 0.83  --  0.94 1.13 1.20 1.06  CALCIUM 8.4*  --  7.9* 8.2* 8.0* 8.2*  MG  --  1.8  --   --   --  2.0  PHOS  --   --  4.2  --   --   --     GFR: Estimated Creatinine Clearance: 54.6 mL/min (by C-G formula based on SCr of  1.06 mg/dL).  Liver Function Tests: Recent Labs  Lab 02/27/23 0920 02/28/23 0306  AST 28 30  ALT 39 39  ALKPHOS 59 46  BILITOT 1.4* 1.0  PROT 6.2* 5.1*  ALBUMIN 3.2* 2.5*   No results for input(s): "LIPASE", "AMYLASE" in the last 168 hours. No results for input(s): "AMMONIA" in the last 168 hours.  Coagulation Profile: No results for input(s): "INR", "PROTIME" in the last 168 hours.  Cardiac Enzymes: No results for input(s): "CKTOTAL", "CKMB", "CKMBINDEX", "TROPONINI" in the last 168 hours.  BNP (last 3 results) No results for input(s): "PROBNP" in the last 8760 hours.  Lipid Profile: No results for input(s): "CHOL", "HDL", "LDLCALC", "TRIG", "CHOLHDL", "LDLDIRECT" in the last 72 hours.  Thyroid Function Tests: No results for input(s): "TSH", "T4TOTAL", "FREET4", "T3FREE", "THYROIDAB" in the last 72 hours.  Anemia Panel: No results for input(s): "VITAMINB12", "FOLATE", "FERRITIN", "TIBC", "IRON", "RETICCTPCT" in the last 72 hours.  Urine analysis:    Component Value Date/Time   COLORURINE COLORLESS (A) 02/27/2023 1252   APPEARANCEUR CLEAR 02/27/2023 1252   APPEARANCEUR Clear 06/20/2013 1326   LABSPEC 1.013 02/27/2023 1252   LABSPEC 1.005 06/20/2013 1326   PHURINE 5.0 02/27/2023 1252   GLUCOSEU NEGATIVE 02/27/2023 1252   GLUCOSEU Negative 06/20/2013 1326   HGBUR NEGATIVE 02/27/2023 1252   BILIRUBINUR NEGATIVE 02/27/2023 1252   BILIRUBINUR Negative 06/20/2013 1326   KETONESUR 5 (A) 02/27/2023 1252   PROTEINUR NEGATIVE 02/27/2023 1252   NITRITE NEGATIVE  02/27/2023 1252   LEUKOCYTESUR NEGATIVE 02/27/2023 1252   LEUKOCYTESUR Negative 06/20/2013 1326    Sepsis Labs: Lactic Acid, Venous    Component Value Date/Time   LATICACIDVEN 0.8 02/27/2023 1210    MICROBIOLOGY: Recent Results (from the past 240 hour(s))  Blood culture (routine x 2)     Status: None (Preliminary result)   Collection Time: 02/27/23 10:05 AM   Specimen: Right Antecubital; Blood  Result Value Ref Range Status   Specimen Description   Final    RIGHT ANTECUBITAL BLOOD Performed at [facility], [address], [place], [place] [address]    Special Requests   Final    BOTTLES DRAWN AEROBIC AND ANAEROBIC Blood Culture adequate volume Performed at [facility], [address] [place], [place] [address]    Culture   Final    NO GROWTH 4 DAYS Performed at [facility], [address] [place], [place] [address]    Report Status PENDING  Incomplete  Blood culture (routine x 2)     Status: None (Preliminary result)   Collection Time: 02/27/23 10:06 AM   Specimen: BLOOD LEFT ARM  Result Value Ref Range Status   Specimen Description   Final    BLOOD LEFT ARM Performed at [facility], [address], [place], [place] [address]    Special Requests   Final    BOTTLES DRAWN AEROBIC AND ANAEROBIC Blood Culture adequate volume Performed at [facility], [address] [place], [place] [address]    Culture   Final    NO GROWTH 4 DAYS Performed at [facility], [address], [place], [place] [address]    Report Status PENDING  Incomplete  Resp panel by RT-PCR (RSV, Flu A&B, Covid) Anterior Nasal Swab     Status: None   Collection Time: 02/27/23 12:43 PM   Specimen: Anterior Nasal Swab  Result Value Ref Range Status   SARS Coronavirus 2 by RT PCR NEGATIVE NEGATIVE  Final    Comment: (NOTE) SARS-CoV-2 target nucleic acids are NOT DETECTED.  The SARS-CoV-2 RNA is generally detectable in  upper respiratory specimens during the acute phase of infection. The lowest concentration of SARS-CoV-2 viral copies this assay can detect is 138 copies/mL. A negative result does not preclude SARS-Cov-2 infection and should not be used as the sole basis for treatment or other patient management decisions. A negative result may occur with  improper specimen collection/handling, submission of specimen other than nasopharyngeal swab, presence of viral mutation(s) within the areas targeted by this assay, and inadequate number of viral copies(<138 copies/mL). A negative result must be combined with clinical observations, patient history, and epidemiological information. The expected result is Negative.  Fact Sheet for Patients:  BloggerCourse.com  Fact Sheet for Healthcare Providers:  SeriousBroker.it  This test is no t yet approved or cleared by the [place] FDA and  has been authorized for detection and/or diagnosis of SARS-CoV-2 by FDA under an Emergency Use Authorization (EUA). This EUA will remain  in effect (meaning this test can be used) for the duration of the COVID-19 declaration under Section 564(b)(1) of the Act, 21 U.S.C.section 360bbb-3(b)(1), unless the authorization is terminated  or revoked sooner.       Influenza A by PCR NEGATIVE NEGATIVE Final   Influenza B by PCR NEGATIVE NEGATIVE Final    Comment: (NOTE) The Xpert Xpress SARS-CoV-2/FLU/RSV plus assay is intended as an aid in the diagnosis of influenza from Nasopharyngeal swab specimens and should not be used as a sole basis for treatment. Nasal washings and aspirates are unacceptable for Xpert Xpress SARS-CoV-2/FLU/RSV testing.  Fact Sheet for Patients: BloggerCourse.com  Fact Sheet for Healthcare Providers: SeriousBroker.it  This test is not yet approved or cleared by the [place] FDA and has been  authorized for detection and/or diagnosis of SARS-CoV-2 by FDA under an Emergency Use Authorization (EUA). This EUA will remain in effect (meaning this test can be used) for the duration of the COVID-19 declaration under Section 564(b)(1) of the Act, 21 U.S.C. section 360bbb-3(b)(1), unless the authorization is terminated or revoked.     Resp Syncytial Virus by PCR NEGATIVE NEGATIVE Final    Comment: (NOTE) Fact Sheet for Patients: BloggerCourse.com  Fact Sheet for Healthcare Providers: SeriousBroker.it  This test is not yet approved or cleared by the [place] FDA and has been authorized for detection and/or diagnosis of SARS-CoV-2 by FDA under an Emergency Use Authorization (EUA). This EUA will remain in effect (meaning this test can be used) for the duration of the COVID-19 declaration under Section 564(b)(1) of the Act, 21 U.S.C. section 360bbb-3(b)(1), unless the authorization is terminated or revoked.  Performed at [facility], [address], [place], [place] [address]   Respiratory (~20 pathogens) panel by PCR     Status: None   Collection Time: 02/27/23 12:43 PM   Specimen: Nasopharyngeal Swab; Respiratory  Result Value Ref Range Status   Adenovirus NOT DETECTED NOT DETECTED Final   Coronavirus 229E NOT DETECTED NOT DETECTED Final    Comment: (NOTE) The Coronavirus on the Respiratory Panel, DOES NOT test for the novel  Coronavirus (2019 nCoV)    Coronavirus HKU1 NOT DETECTED NOT DETECTED Final   Coronavirus NL63 NOT DETECTED NOT DETECTED Final   Coronavirus OC43 NOT DETECTED NOT DETECTED Final   Metapneumovirus NOT DETECTED NOT DETECTED Final   Rhinovirus / Enterovirus NOT DETECTED NOT DETECTED Final   Influenza A NOT DETECTED NOT DETECTED Final  Influenza B NOT DETECTED NOT DETECTED Final   Parainfluenza Virus 1 NOT DETECTED NOT DETECTED Final   Parainfluenza Virus 2 NOT DETECTED NOT  DETECTED Final   Parainfluenza Virus 3 NOT DETECTED NOT DETECTED Final   Parainfluenza Virus 4 NOT DETECTED NOT DETECTED Final   Respiratory Syncytial Virus NOT DETECTED NOT DETECTED Final   Bordetella pertussis NOT DETECTED NOT DETECTED Final   Bordetella Parapertussis NOT DETECTED NOT DETECTED Final   Chlamydophila pneumoniae NOT DETECTED NOT DETECTED Final   Mycoplasma pneumoniae NOT DETECTED NOT DETECTED Final    Comment: Performed at [facility], [address] [place], [place] [address]  MRSA Next Gen by PCR, Nasal     Status: None   Collection Time: 02/28/23  8:57 AM   Specimen: Nasal Mucosa; Nasal Swab  Result Value Ref Range Status   MRSA by PCR Next Gen NOT DETECTED NOT DETECTED Final    Comment: (NOTE) The GeneXpert MRSA Assay (FDA approved for NASAL specimens only), is one component of a comprehensive MRSA colonization surveillance program. It is not intended to diagnose MRSA infection nor to guide or monitor treatment for MRSA infections. Test performance is not FDA approved in patients less than 62 years old. Performed at [facility], [address] [place], [place] [address]     RADIOLOGY STUDIES/RESULTS: DG CHEST PORT 1 VIEW  Result Date: 03/01/2023 CLINICAL DATA:  Pulmonary edema, shortness of breath with cough. EXAM: PORTABLE CHEST 1 VIEW COMPARISON:  Chest x-ray 09/28/2022 FINDINGS: Left perihilar airspace opacities are present. There are small bilateral pleural effusions. The cardiomediastinal silhouette is within normal limits. No acute fractures. IMPRESSION: 1. Left perihilar airspace opacities concerning for pneumonia. 2. Small bilateral pleural effusions. Electronically Signed   By: Darliss Cheney M.D.   On: 03/01/2023 16:23     LOS: 4 days   Jeoffrey Massed, MD  [facility]    To contact the attending provider between 7A-7P or the covering provider during after hours 7P-7A, please log into the web site [email or web address] and  access using universal [place] password for that web site. If you do not have the password, please call the hospital operator.  03/03/2023, 10:52 AM

## 2023-03-03 NOTE — Care Management Important Message (Signed)
Important Message  Patient Details  Name: Randy Duncan MRN: [id number] Date of Birth: July 31, 1946   Medicare Important Message Given:  Yes     Miski Feldpausch Stefan Church 03/03/2023, 1:09 PM

## 2023-03-03 NOTE — Evaluation (Signed)
Occupational Therapy Evaluation Patient Details Name: Randy Duncan MRN: [id number] DOB: 06-Sep-1946 Today's Date: 03/03/2023   History of Present Illness Randy Duncan is a 77 y.o. male followed by Dr. Fransisco Beau with [facility] with a hx of CAD, DM2, hx of bioprosthetic aortic valve repair in 2008, hx of ascending aorta repair, centrilobular emphysema, celiac disease, hyperglycemia, sleep disorder, pancytopenia who presented to the emergency department 02/27/23 with complaints of fever of 101 F at home, dark sputum productive cough, chest pain and dyspnea.2 view chest radiograph showing new bilateral central pulmonary airspace opacity batwing configuration suspicious for acute pulmonary edema. Pt will have TAVR procedure asap.   Clinical Impression   Pt admitted with the above diagnosis and has the deficits outlined below. Pt would benefit from cont OT to increase independence with basic adls to mod I level and educate pt further on energy conservation techniques. Pt was independent PTA and an avid golfer and now feels weak and tired.  Pt is at supervision level overall with most adls at this time and will be safe for d/c home without follow up OT.  Recommend wife bring golf club in for pt to practice a swing with OT to challenge strength and balance.  Will continue to follow.       Recommendations for follow up therapy are one component of a multi-disciplinary discharge planning process, led by the attending physician.  Recommendations may be updated based on patient status, additional functional criteria and insurance authorization.   Assistance Recommended at Discharge Intermittent Supervision/Assistance  Patient can return home with the following A little help with bathing/dressing/bathroom;Assistance with cooking/housework    Functional Status Assessment  Patient has had a recent decline in their functional status and demonstrates the ability to make significant improvements in  function in a reasonable and predictable amount of time.  Equipment Recommendations  None recommended by OT    Recommendations for Other Services       Precautions / Restrictions Precautions Precautions: None Restrictions Weight Bearing Restrictions: No      Mobility Bed Mobility Overal bed mobility: Modified Independent             General bed mobility comments: HOB up, used rail    Transfers Overall transfer level: Needs assistance Equipment used: None Transfers: Sit to/from Stand Sit to Stand: Supervision           General transfer comment: VCs hand placement      Balance Overall balance assessment: Modified Independent                                         ADL either performed or assessed with clinical judgement   ADL Overall ADL's : Needs assistance/impaired Eating/Feeding: Independent;Sitting   Grooming: Wash/dry hands;Wash/dry face;Oral care;Supervision/safety;Standing   Upper Body Bathing: Set up;Sitting   Lower Body Bathing: Supervison/ safety;Sit to/from stand;Cueing for compensatory techniques   Upper Body Dressing : Set up;Sitting   Lower Body Dressing: Supervision/safety;Sit to/from stand;Cueing for compensatory techniques   Toilet Transfer: Supervision/safety;Ambulation;Comfort height toilet;Grab bars   Toileting- Clothing Manipulation and Hygiene: Supervision/safety;Sit to/from stand       Functional mobility during ADLs: Supervision/safety General ADL Comments: Pt doing well with adls despite feeling weak and tired.  Reviewed energy conservation techniques for pt to follow to assist with energy level at home.     Vision Baseline Vision/History: 1 Wears glasses Ability to  See in Adequate Light: 0 Adequate Patient Visual Report: No change from baseline Vision Assessment?: No apparent visual deficits     Perception Perception Perception Tested?: No   Praxis Praxis Praxis tested?: Within functional limits     Pertinent Vitals/Pain Pain Assessment Pain Assessment: No/denies pain     Hand Dominance Right   Extremity/Trunk Assessment Upper Extremity Assessment Upper Extremity Assessment: Overall WFL for tasks assessed   Lower Extremity Assessment Lower Extremity Assessment: Defer to PT evaluation   Cervical / Trunk Assessment Cervical / Trunk Assessment: Normal   Communication Communication Communication: No difficulties   Cognition Arousal/Alertness: Awake/alert Behavior During Therapy: WFL for tasks assessed/performed Overall Cognitive Status: Within Functional Limits for tasks assessed                                       General Comments  Pt overall at supervision level with all basic adls and mobility. Pt is an avid golfer and wants to get back to golfing.  Wife to bring in golf club for pt to practice a slow swing in an open area.    Exercises     Shoulder Instructions      Home Living Family/patient expects to be discharged to:: Private residence Living Arrangements: Spouse/significant other Available Help at Discharge: Family;Available 24 hours/day Type of Home: Mobile home Home Access: Stairs to enter Entrance Stairs-Number of Steps: 2   Home Layout: One level     Bathroom Shower/Tub: Walk-in shower;Door   [employer] Toilet: Standard     Home Equipment: Agricultural consultant (2 wheels);Cane - single point          Prior Functioning/Environment Prior Level of Function : Independent/Modified Independent             Mobility Comments: walks independently, denies falls in past 6 months, plays golf 3x/week ADLs Comments: independent        OT Problem List: Decreased activity tolerance;Cardiopulmonary status limiting activity      OT Treatment/Interventions: Self-care/ADL training;Balance training;Therapeutic activities    OT Goals(Current goals can be found in the care plan section) Acute Rehab OT Goals Patient Stated Goal: to play golf  as soon as I can OT Goal Formulation: With patient Time For Goal Achievement: 03/17/23 Potential to Achieve Goals: Good ADL Goals Additional ADL Goal #1: Pt will gather all clothing w/o assistive device and dress self with mod I Additional ADL Goal #2: Pt will walk to bathroom and complete all toileting with mod I Additional ADL Goal #3: Pt will state 3 things he can do at home to conserve energy during adl routine without cues. Additional ADL Goal #4: Pt will complete slow swing of golf club with supevision only for balance (wife to bring club in)  OT Frequency: Min 2X/week    Co-evaluation              AM-PAC OT "6 Clicks" Daily Activity     Outcome Measure Help from another person eating meals?: None Help from another person taking care of personal grooming?: None Help from another person toileting, which includes using toliet, bedpan, or urinal?: A Little Help from another person bathing (including washing, rinsing, drying)?: None Help from another person to put on and taking off regular upper body clothing?: None Help from another person to put on and taking off regular lower body clothing?: A Little 6 Click Score: 22   End of Session  Nurse Communication: Mobility status  Activity Tolerance: Patient tolerated treatment well Patient left: Other (comment) (With PT)  OT Visit Diagnosis: Unsteadiness on feet (R26.81)                Time: 8119-1478 OT Time Calculation (min): 32 min Charges:  OT General Charges $OT Visit: 1 Visit OT Evaluation $OT Eval Low Complexity: 1 Low OT Treatments $Self Care/Home Management : 8-22 mins  Hope Budds 03/03/2023, 12:33 PM

## 2023-03-03 NOTE — Progress Notes (Signed)
Physical Therapy Treatment Patient Details Name: Randy Duncan MRN: [id number] DOB: 06-Jun-1946 Today's Date: 03/03/2023   History of Present Illness Randy Duncan is a 77 y.o. male followed by Dr. Fransisco Beau with [facility] with a hx of CAD, DM2, hx of bioprosthetic aortic valve repair in 2008, hx of ascending aorta repair, centrilobular emphysema, celiac disease, hyperglycemia, sleep disorder, pancytopenia who presented to the emergency department 02/27/23 with complaints of fever of 101 F at home, dark sputum productive cough, chest pain and dyspnea.2 view chest radiograph showing new bilateral central pulmonary airspace opacity batwing configuration suspicious for acute pulmonary edema. Pt will have TAVR procedure asap.    PT Comments    Pt greeted up in chair on handoff from OT and eager for PT session with focus on gait without AD for increased activity tolerance and improved balance/postural reactions. Pt requiring grossly min guard for safe gait in hall as pt with some L drift noted with dual tasking during gait, with pt aware and able to correct. Pt with good tolerance for dynamic balance exercises, stepping and reaching outside BOS and picking items up from floor to simulate golf ball retrieval. Current plan remains appropriate to address deficits and maximize functional independence. Pt continues to benefit from skilled PT services to progress toward functional mobility goals.    Recommendations for follow up therapy are one component of a multi-disciplinary discharge planning process, led by the attending physician.  Recommendations may be updated based on patient status, additional functional criteria and insurance authorization.  Follow Up Recommendations       Assistance Recommended at Discharge Set up Supervision/Assistance  Patient can return home with the following Assistance with cooking/housework   Equipment Recommendations  None recommended by PT     Recommendations for Other Services       Precautions / Restrictions Precautions Precautions: None Restrictions Weight Bearing Restrictions: No     Mobility  Bed Mobility Overal bed mobility: Modified Independent             General bed mobility comments: pt up in chair on arrival    Transfers Overall transfer level: Needs assistance Equipment used: None Transfers: Sit to/from Stand Sit to Stand: Supervision           General transfer comment: good recall for hand placement    Ambulation/Gait Ambulation/Gait assistance: Min guard Gait Distance (Feet): 550 Feet Assistive device: None Gait Pattern/deviations: WFL(Within Functional Limits) Gait velocity: WFL     General Gait Details: no overt LOB without AD, some L drift and general instability with dual tasking   Stairs             Wheelchair Mobility    Modified Rankin (Stroke Patients Only)       Balance Overall balance assessment: Modified Independent                               Standardized Balance Assessment Standardized Balance Assessment : Dynamic Gait Index   Dynamic Gait Index Level Surface: Normal Change in Gait Speed: Normal Gait with Horizontal Head Turns: Mild Impairment Gait with Vertical Head Turns: Mild Impairment Gait and Pivot Turn: Normal Step Over Obstacle: Normal Step Around Obstacles: Normal Steps: Mild Impairment Total Score: 21      Cognition Arousal/Alertness: Awake/alert Behavior During Therapy: WFL for tasks assessed/performed Overall Cognitive Status: Within Functional Limits for tasks assessed  Exercises Other Exercises Other Exercises: stepping and reaching outside BOS and back, alteranting LE x20 ea side Other Exercises: picking up sock pack from floor x10 to simulate golf ball retrieval    General Comments General comments (skin integrity, edema, etc.): Pt overall at  supervision level with all basic adls and mobility. Pt is an avid golfer and wants to get back to golfing.  Wife to bring in golf club for pt to practice a slow swing in an open area.      Pertinent Vitals/Pain Pain Assessment Pain Assessment: No/denies pain    Home Living Family/patient expects to be discharged to:: Private residence Living Arrangements: Spouse/significant other Available Help at Discharge: Family;Available 24 hours/day Type of Home: Mobile home Home Access: Stairs to enter   Entrance Stairs-Number of Steps: 2   Home Layout: One level Home Equipment: Agricultural consultant (2 wheels);Cane - single point      Prior Function            PT Goals (current goals can now be found in the care plan section) Acute Rehab PT Goals PT Goal Formulation: With patient Time For Goal Achievement: 03/15/23 Progress towards PT goals: Progressing toward goals    Frequency    Min 1X/week      PT Plan Current plan remains appropriate    Co-evaluation              AM-PAC PT "6 Clicks" Mobility   Outcome Measure  Help needed turning from your back to your side while in a flat bed without using bedrails?: A Little Help needed moving from lying on your back to sitting on the side of a flat bed without using bedrails?: A Little Help needed moving to and from a bed to a chair (including a wheelchair)?: None Help needed standing up from a chair using your arms (e.g., wheelchair or bedside chair)?: None Help needed to walk in hospital room?: None Help needed climbing 3-5 steps with a railing? : A Little 6 Click Score: 21    End of Session Equipment Utilized During Treatment: Gait belt Activity Tolerance: Patient tolerated treatment well Patient left: in chair;with call bell/phone within reach;with family/visitor present Nurse Communication: Mobility status PT Visit Diagnosis: Difficulty in walking, not elsewhere classified (R26.2)     Time: 4696-2952 PT Time  Calculation (min) (ACUTE ONLY): 27 min  Charges:  $Gait Training: 8-22 mins $Therapeutic Exercise: 8-22 mins                     Milferd Ansell R. PTA Acute Rehabilitation Services Office: [phone]   Catalina Antigua 03/03/2023, 1:21 PM

## 2023-03-04 ENCOUNTER — Encounter (HOSPITAL_COMMUNITY)
Admission: EM | Disposition: A | Discharge status: Home Health | Payer: Self-pay | Source: Home / Self Care | Attending: Internal Medicine

## 2023-03-04 ENCOUNTER — Inpatient Hospital Stay (HOSPITAL_COMMUNITY): Payer: Medicare HMO

## 2023-03-04 ENCOUNTER — Inpatient Hospital Stay (HOSPITAL_COMMUNITY): Payer: Medicare HMO | Admitting: Anesthesiology

## 2023-03-04 ENCOUNTER — Encounter (HOSPITAL_COMMUNITY): Payer: Self-pay | Admitting: Internal Medicine

## 2023-03-04 DIAGNOSIS — J9601 Acute respiratory failure with hypoxia: Secondary | ICD-10-CM | POA: Diagnosis not present

## 2023-03-04 DIAGNOSIS — I2510 Atherosclerotic heart disease of native coronary artery without angina pectoris: Secondary | ICD-10-CM

## 2023-03-04 DIAGNOSIS — I110 Hypertensive heart disease with heart failure: Secondary | ICD-10-CM

## 2023-03-04 DIAGNOSIS — I509 Heart failure, unspecified: Secondary | ICD-10-CM

## 2023-03-04 DIAGNOSIS — I5043 Acute on chronic combined systolic (congestive) and diastolic (congestive) heart failure: Secondary | ICD-10-CM | POA: Diagnosis not present

## 2023-03-04 DIAGNOSIS — Z87891 Personal history of nicotine dependence: Secondary | ICD-10-CM

## 2023-03-04 DIAGNOSIS — I351 Nonrheumatic aortic (valve) insufficiency: Secondary | ICD-10-CM

## 2023-03-04 HISTORY — PX: TEE WITHOUT CARDIOVERSION: SHX5443

## 2023-03-04 LAB — CULTURE, BLOOD (ROUTINE X 2)
Special Requests: ADEQUATE
Special Requests: ADEQUATE

## 2023-03-04 LAB — BASIC METABOLIC PANEL
Anion gap: 6 (ref 5–15)
BUN: 15 mg/dL (ref 8–23)
CO2: 27 mmol/L (ref 22–32)
Calcium: 8.3 mg/dL — ABNORMAL LOW (ref 8.9–10.3)
Chloride: 100 mmol/L (ref 98–111)
Creatinine, Ser: 1.15 mg/dL (ref 0.61–1.24)
GFR, Estimated: >60 mL/min (ref >60)
Glucose, Bld: 120 mg/dL — ABNORMAL HIGH (ref 70–99)
Potassium: 3.9 mmol/L (ref 3.5–5.1)
Sodium: 133 mmol/L — ABNORMAL LOW (ref 135–145)

## 2023-03-04 LAB — ECHO TEE
AR max vel: 1 cm2
AV Area VTI: 0.94 cm2
AV Area mean vel: 0.89 cm2
AV Mean grad: 22 mmHg
AV Peak grad: 35.8 mmHg
Ao pk vel: 2.99 m/s

## 2023-03-04 LAB — MAGNESIUM: Magnesium: 2.1 mg/dL (ref 1.7–2.4)

## 2023-03-04 SURGERY — ECHOCARDIOGRAM, TRANSESOPHAGEAL
Anesthesia: Monitor Anesthesia Care

## 2023-03-04 MED ORDER — PHENYLEPHRINE 80 MCG/ML (10ML) SYRINGE FOR IV PUSH (FOR BLOOD PRESSURE SUPPORT)
PREFILLED_SYRINGE | INTRAVENOUS | Status: DC | PRN
  Administered 2023-03-04 (×10): 160 ug via INTRAVENOUS

## 2023-03-04 MED ORDER — EPHEDRINE SULFATE-NACL 50-0.9 MG/10ML-% IV SOSY
PREFILLED_SYRINGE | INTRAVENOUS | Status: DC | PRN
  Administered 2023-03-04: 10 mg via INTRAVENOUS
  Administered 2023-03-04: 5 mg via INTRAVENOUS
  Administered 2023-03-04 (×2): 10 mg via INTRAVENOUS

## 2023-03-04 MED ORDER — LIDOCAINE 2% (20 MG/ML) 5 ML SYRINGE
INTRAMUSCULAR | Status: DC | PRN
  Administered 2023-03-04: 100 mg via INTRAVENOUS

## 2023-03-04 MED ORDER — PROPOFOL 10 MG/ML IV BOLUS
INTRAVENOUS | Status: DC | PRN
  Administered 2023-03-04: 30 mg via INTRAVENOUS
  Administered 2023-03-04: 50 mg via INTRAVENOUS

## 2023-03-04 MED ORDER — PROPOFOL 500 MG/50ML IV EMUL
INTRAVENOUS | Status: DC | PRN
  Administered 2023-03-04: 120 ug/kg/min via INTRAVENOUS

## 2023-03-04 MED ORDER — GLYCOPYRROLATE 0.2 MG/ML IJ SOLN
INTRAMUSCULAR | Status: DC | PRN
  Administered 2023-03-04: .1 mg via INTRAVENOUS

## 2023-03-04 NOTE — Anesthesia Postprocedure Evaluation (Signed)
Anesthesia Post Note  Patient: Randy Duncan  Procedure(s) Performed: TRANSESOPHAGEAL ECHOCARDIOGRAM     Patient location during evaluation: PACU Anesthesia Type: MAC Level of consciousness: awake Pain management: pain level controlled Vital Signs Assessment: post-procedure vital signs reviewed and stable Respiratory status: spontaneous breathing, nonlabored ventilation and respiratory function stable Cardiovascular status: stable and blood pressure returned to baseline Postop Assessment: no apparent nausea or vomiting Anesthetic complications: no  No notable events documented.  Last Vitals:  Vitals:   03/04/23 1700 03/04/23 1933  BP: (!) 121/54 (!) 134/45  Pulse: 72 76  Resp: 20 13  Temp: 36.6 C 36.4 C  SpO2:  95%    Last Pain:  Vitals:   03/04/23 1933  TempSrc: Axillary  PainSc: 0-No pain                 Linton Rump

## 2023-03-04 NOTE — Progress Notes (Signed)
  Echocardiogram Echocardiogram Transesophageal has been performed.  Delcie Roch 03/04/2023, 1:42 PM

## 2023-03-04 NOTE — Anesthesia Preprocedure Evaluation (Addendum)
Anesthesia Evaluation  Patient identified by MRN, date of birth, ID band Patient awake    Reviewed: Allergy & Precautions, NPO status , Patient's Chart, lab work & pertinent test results  History of Anesthesia Complications Negative for: history of anesthetic complications  Airway Mallampati: III  TM Distance: >3 FB Neck ROM: Full    Dental  (+) Poor Dentition, Dental Advisory Given   Pulmonary neg shortness of breath, sleep apnea , pneumonia, COPD, neg recent URI, former smoker Pulmonary nodules   Pulmonary exam normal breath sounds clear to auscultation       Cardiovascular hypertension, (-) angina + CAD and +CHF  (-) dysrhythmias + Valvular Problems/Murmurs (s/p AVR) AS and AI  Rhythm:Regular Rate:Normal  Coronary CT 03/03/2023: IMPRESSION: 1. 77 yo Edwards 3000 TFX Perimount Magna Valve with thickened leaflets and mild calcium. 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 Also concern for area of PVL or annular dehiscence near the base of the left cusp with calcified intervalvular fibrosa inferior to this Patient will need TEE to further assess if his severe AR is central/PVL or combination of both   2. Coronary arteries quite high and not at risk for occlusion with valve in valve procedure being above simulated valve implant with Sapien   3. Valve in Valve app suggests using 20 mm Sapien vs fracture with 22 balloon and using a 23 mm Sapien. Embedded geometry confirms that to fit a 23 mm valve the annulus would need balloon fracture   4. Intact straight graft repair of the ascending thoracic aorta with no residual aneurysm   5. Optimum angiographic angle for deployment RAO 7 Caudal 13 degrees with aortic root contrast injection to try and visualize hinge points for leaflets  TTE 02/28/2023: IMPRESSIONS    1. Left ventricular ejection fraction, by estimation, is 60 to 65%. The  left ventricle has normal function. The left  ventricle has no regional  wall motion abnormalities. Left ventricular diastolic parameters are  indeterminate.   2. Right ventricular systolic function is normal. The right ventricular  size is mildly enlarged.   3. Left atrial size was mild to moderately dilated.   4. Right atrial size was moderately dilated.   5. The mitral valve is degenerative. Mild mitral valve regurgitation. No  evidence of mitral stenosis. Moderate mitral annular calcification.   6. Tricuspid valve regurgitation is mild to moderate.   7. The aortic valve has been repaired/replaced. Aortic valve  regurgitation is severe. Mild aortic valve stenosis. There is a 21 mm  Magna pericardial valve present in the aortic position. Procedure Date:  11/2006. Aortic regurgitation PHT measures 147  msec. Aortic valve area, by VTI measures 1.46 cm. Aortic valve mean  gradient measures 20.0 mmHg. Aortic valve Vmax measures 3.08 m/s.   8. The inferior vena cava is dilated in size with <50% respiratory  variability, suggesting right atrial pressure of 15 mmHg.      Neuro/Psych  PSYCHIATRIC DISORDERS Anxiety     negative neurological ROS     GI/Hepatic Neg liver ROS,neg GERD  ,,Celiac disease   Endo/Other  negative endocrine ROS    Renal/GU negative Renal ROS     Musculoskeletal   Abdominal   Peds  Hematology  (+) Blood dyscrasia, anemia   Anesthesia Other Findings 77 y.o. male followed by Dr. Fransisco Beau with [facility] with a hx of CAD, DM2, hx of bioprosthetic aortic valve repair in 2008, hx of ascending aorta repair, centrilobular emphysema, celiac disease, hyperglycemia, sleep disorder, pancytopenia now  admitted with PNA and CHF and chest pain.     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    Reproductive/Obstetrics                             Anesthesia Physical Anesthesia Plan  ASA: 4  Anesthesia Plan: MAC   Post-op Pain Management: Minimal or no  pain anticipated   Induction: Intravenous  PONV Risk Score and Plan: 1 and Propofol infusion and Treatment may vary due to age or medical condition  Airway Management Planned: Natural Airway and Nasal Cannula  Additional Equipment:   Intra-op Plan:   Post-operative Plan:   Informed Consent: I have reviewed the patients History and Physical, chart, labs and discussed the procedure including the risks, benefits and alternatives for the proposed anesthesia with the patient or authorized representative who has indicated his/her understanding and acceptance.     Dental advisory given  Plan Discussed with: Anesthesiologist and CRNA  Anesthesia Plan Comments: (Discussed with patient risks of MAC including, but not limited to, minor pain or discomfort, hearing people in the room, and possible need for backup general anesthesia. Risks for general anesthesia also discussed including, but not limited to, sore throat, hoarse voice, chipped/damaged teeth, injury to vocal cords, nausea and vomiting, allergic reactions, lung infection, heart attack, stroke, and death. All questions answered. )        Anesthesia Quick Evaluation

## 2023-03-04 NOTE — Progress Notes (Signed)
Rounding Note    Patient Name: Randy Duncan Date of Encounter: 03/04/2023   [facility] Cardiologist: Charlton Haws, MD   Subjective   Breathing back to baseline Working with PT for TEE today  Inpatient Medications    Scheduled Meds:  albuterol  2.5 mg Nebulization BID   aspirin EC  81 mg Oral Daily   atorvastatin  80 mg Oral QHS   Chlorhexidine Gluconate Cloth  6 each Topical Q2000   furosemide  40 mg Oral Daily   hydrALAZINE  25 mg Oral BID   isosorbide mononitrate  15 mg Oral Daily   metoprolol succinate  12.5 mg Oral Daily   mouth rinse  15 mL Mouth Rinse 4 times per day   pantoprazole  40 mg Oral Daily   ranolazine  1,000 mg Oral BID   traZODone  25 mg Oral QHS   Continuous Infusions:  sodium chloride 10 mL/hr at 02/28/23 1807   sodium chloride 20 mL/hr at 03/04/23 0610   PRN Meds: sodium chloride, acetaminophen **OR** acetaminophen, alum & mag hydroxide-simeth, clonazePAM, docusate sodium, guaiFENesin, hydrALAZINE, ipratropium-albuterol, metoprolol tartrate, nitroGLYCERIN, ondansetron **OR** ondansetron (ZOFRAN) IV, mouth rinse, polyethylene glycol   Vital Signs    Vitals:   03/04/23 0004 03/04/23 0403 03/04/23 0409 03/04/23 0759  BP: (!) 135/42 (!) 110/32  (!) 127/43  Pulse: 76 79  74  Resp: 17 17  16   Temp: 97.9 F (36.6 C) 97.7 F (36.5 C)  97.8 F (36.6 C)  TempSrc: Oral Oral  Oral  SpO2: 94% 93%  94%  Weight:   72.9 kg   Height:        Intake/Output Summary (Last 24 hours) at 03/04/2023 0816 Last data filed at 03/04/2023 0610 Gross per 24 hour  Intake 790 ml  Output 1500 ml  Net -710 ml      03/04/2023    4:09 AM 03/03/2023    1:00 AM 03/02/2023   11:25 AM  Last 3 Weights  Weight (lbs) 160 lb 11.5 oz 148 lb 5.9 oz 162 lb 14.7 oz  Weight (kg) 72.9 kg 67.3 kg 73.9 kg      Telemetry    Sinus in the 70s - Personally Reviewed  ECG    No new tracings - Personally Reviewed  Physical Exam   GEN: No acute distress.   Neck:  No JVD Cardiac: RRR, 2/6 systolic murmur  Respiratory: Clear to auscultation bilaterally, diminished in right base GI: Soft, nontender, non-distended  MS: No edema; No deformity. Neuro:  Nonfocal  Psych: Normal affect   Labs    High Sensitivity Troponin:   Recent Labs  Lab 02/27/23 0920 02/27/23 1210 02/27/23 1949 02/27/23 2227  TROPONINIHS 45* 50* 101* 91*     Chemistry Recent Labs  Lab 02/27/23 0920 02/27/23 1949 02/28/23 0306 03/01/23 0306 03/02/23 0310 03/03/23 0422 03/04/23 0407  NA 128*  --  132*   < > 131* 133* 133*  K 4.0  --  4.0   < > 3.6 3.9 3.9  CL 98  --  103   < > 96* 99 100  CO2 20*  --  21*   < > 28 27 27   GLUCOSE 138*  --  105*   < > 144* 121* 120*  BUN 15  --  15   < > 26* 19 15  CREATININE 0.83  --  0.94   < > 1.20 1.06 1.15  CALCIUM 8.4*  --  7.9*   < >  8.0* 8.2* 8.3*  MG  --  1.8  --   --   --  2.0 2.1  PROT 6.2*  --  5.1*  --   --   --   --   ALBUMIN 3.2*  --  2.5*  --   --   --   --   AST 28  --  30  --   --   --   --   ALT 39  --  39  --   --   --   --   ALKPHOS 59  --  46  --   --   --   --   BILITOT 1.4*  --  1.0  --   --   --   --   GFRNONAA >60  --  >60   < > >60 >60 >60  ANIONGAP 10  --  8   < > 7 7 6    < > = values in this interval not displayed.    Lipids No results for input(s): "CHOL", "TRIG", "HDL", "LABVLDL", "LDLCALC", "CHOLHDL" in the last 168 hours.  Hematology Recent Labs  Lab 03/01/23 0306 03/02/23 0310 03/02/23 0802  WBC 8.3 8.7 10.0  RBC 2.97* 3.13* 3.29*  HGB 10.0* 10.6* 11.0*  HCT 29.0* 29.9* 31.4*  MCV 97.6 95.5 95.4  MCH 33.7 33.9 33.4  MCHC 34.5 35.5 35.0  RDW 11.9 11.7 11.6  PLT 140* 153 175   Thyroid  Recent Labs  Lab 02/28/23 1002  TSH 1.442  FREET4 1.54*    BNP Recent Labs  Lab 02/27/23 0920  BNP 744.7*    DDimer No results for input(s): "DDIMER" in the last 168 hours.   Radiology    DG Orthopantogram  Result Date: 03/03/2023 CLINICAL DATA:  Poor dentition. EXAM:  ORTHOPANTOGRAM/PANORAMIC COMPARISON:  None Available. FINDINGS: Very poor dentition is noted with many missing teeth. No definite fracture or lytic lesion is seen involving the mandible. At least 1 tooth in the left mandible has significant erosion of its crown. IMPRESSION: No definite fracture or lytic lesion is seen involving the mandible. Electronically Signed   By: Lupita Raider M.D.   On: 03/03/2023 15:41   CT CORONARY MORPH W/CTA COR W/SCORE W/CA W/CM &/OR WO/CM  Addendum Date: 03/03/2023   ADDENDUM REPORT: 03/03/2023 12:15 CLINICAL DATA:  Pre Valve in Valve TAVR EXAM: Cardiac TAVR CT TECHNIQUE: The patient was scanned on a Siemens Force 192 slice scanner. A 120 kV retrospective scan was triggered in the descending thoracic aorta at 111 HU's. Gantry rotation speed was 270 msecs and collimation was .9 mm. No beta blockade or nitro were given. The 3D data set was reconstructed in 5% intervals of the R-R cycle. Systolic and diastolic phases were analyzed on a dedicated work station using MPR, MIP and VRT modes. The patient received 80 cc of contrast. FINDINGS: Aortic Valve: Has been replaced in 2008. 21 mm Edwards 3000 TFX Perimount Magna stented bioprosthetic valve The stent ID is 20 mm the valve height is 15 mm And the True ID is 19 mm The leaflets are thickened with mild calcification there is some prolapse of the non coronary cusp. There is a possible area of annular dehiscence near the left cusp measuring 4.6 mm x 3.7 mm with area of 0.15 cm2. Inferior to this in the LVOT is a large area of calcium at the base of the anterior mitral leaflet and intervalvular fibrosa. Aorta: There appears to be a straight  graft repair of ascending thoracic aorta above the AVR Suture lines intact with no aneurysm Sinotubular Junction: 28 mm Ascending Thoracic Aorta: 29.5 mm Aortic Arch: 31 nn Descending Thoracic Aorta: Very tortuous 25 mm Sinus of Valsalva Measurements: Non-coronary: 33.3 mm Right - coronary: 30.6 mm  Left - coronary: 32 mm Coronary Artery Height above Annulus: Left Main: 20.3 mm above annulus Right Coronary: 17.5 mm above annulus Coronary Arteries: Sufficient height above annulus for deployment The coronary arteries arise aboe the embedded geometry of the valve and do Not appear to be at risk for occlusion Optimum Fluoroscopic Angle for Delivery: RAO 7 Caudal 13 degrees Without balloon fracture a 20 mm Sapien 3 valve appears to fit the ID of the valve better If the valve is dilated with a 22 balloon possible use of 23 Sapien valve IMPRESSION: 1. 77 yo Edwards 3000 TFX Perimount Magna Valve with thickened leaflets and mild calcium. 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 Coronary arteries quite high and not at risk for occlusion with valve in valve procedure being above simulated valve implant with Sapien 3. Valve in Valve app suggests using 20 mm Sapien vs fracture with 22 balloon and using a 23 mm Sapien. Embedded geometry confirms that to fit a 23 mm valve the annulus would need balloon fracture 4. 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 Optimum angiographic angle for deployment RAO 7 Caudal 13 degrees with aortic root contrast injection to try and visualize hinge points for leaflets Charlton Haws Electronically Signed   By: Charlton Haws M.D.   On: 03/03/2023 12:15   Result Date: 03/03/2023 EXAM: OVER-READ INTERPRETATION  CT CHEST The following report is a limited chest CT over-read performed by radiologist Dr. Allegra Lai of [facility] on 03/03/2023. This over-read does not include interpretation of cardiac or coronary anatomy or pathology. The cardiac TAVR interpretation by the cardiologist is attached.  COMPARISON:  None Available. FINDINGS: Extracardiac findings will be described separately under dictation for contemporaneously obtained CTA chest, abdomen and pelvis. IMPRESSION: Please see separate dictation for contemporaneously obtained CTA chest, abdomen and pelvis dated 03/03/2023 for full description of relevant extracardiac findings. Electronically Signed: By: Allegra Lai M.D. On: 03/03/2023 11:22   CT ANGIO CHEST AORTA W/CM & OR WO/CM  Result Date: 03/03/2023 CLINICAL DATA:  Aortic valve replacement preop evaluation EXAM: CT ANGIOGRAPHY CHEST, ABDOMEN AND PELVIS TECHNIQUE: Non-contrast CT of the chest was initially obtained. Multidetector CT imaging through the chest, abdomen and pelvis was performed using the standard protocol during bolus administration of intravenous contrast. Multiplanar reconstructed images and MIPs were obtained and reviewed to evaluate the vascular anatomy. RADIATION DOSE REDUCTION: This exam was performed according to the departmental dose-optimization program which includes automated exposure control, adjustment of the mA and/or kV according to patient size and/or use of iterative reconstruction technique. CONTRAST:  OMNIPAQUE IOHEXOL 350 MG/ML SOLN COMPARISON:  Chest CTA dated February 27, 2023 FINDINGS: CTA CHEST FINDINGS Cardiovascular: Cardiomegaly. No pericardial effusion. 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 Three-vessel coronary artery calcifications. Mediastinum/Nodes: Esophagus and thyroid are unremarkable. Mildly enlarged mediastinal lymph nodes which are likely reactive. Reference subcarinal lymph node measuring 1.3 cm in short axis on  series 3, image 94. Lungs/Pleura: Central airways are patent. Central predominant ground-glass opacities, decreased when compared with the prior exam. Moderate bilateral pleural effusions atelectasis. No pneumothorax. Small solid subpleural nodule of the left upper lobe measuring 4 mm  on series 7, image 53. Musculoskeletal: Prior median sternotomy. Mild no chest wall abnormality. No acute or significant osseous findings. CTA ABDOMEN AND PELVIS FINDINGS Hepatobiliary: No focal liver abnormality is seen. No gallstones, gallbladder wall thickening, or biliary dilatation. Pancreas: Unremarkable. No pancreatic ductal dilatation or surrounding inflammatory changes. Spleen: Normal in size without focal abnormality. Adrenals/Urinary Tract: Bilateral adrenal glands are unremarkable. No hydronephrosis or nephrolithiasis. Bladder is unremarkable. Stomach/Bowel: Stomach is within normal limits. No evidence of bowel wall thickening, distention, or inflammatory changes. Vascular/lymphatic: Normal caliber abdominal aorta with severe atherosclerotic disease including areas of ulcerated soft plaque. 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 Other major branch vessels of the abdominal aorta are patent. No pathologically enlarged lymph nodes seen in the chest. Reproductive: Prostate is unremarkable. Other: No abdominal wall hernia or abnormality. No abdominopelvic ascites. Musculoskeletal: No acute or significant osseous findings. VASCULAR MEASUREMENTS PERTINENT TO TAVR: AORTA: Minimal Aortic Diameter-13.7 mm Severity of Aortic Calcification-moderate RIGHT PELVIS: Right Common Iliac Artery - Minimal Diameter-7.9 mm Tortuosity-severe Calcification-moderate Right External Iliac Artery - Minimal Diameter-8.6 mm Tortuosity-mild Calcification-none Right Common Femoral Artery - Minimal Diameter-7.7 mm Tortuosity-none Calcification-moderate LEFT PELVIS: Left Common Iliac Artery - Minimal Diameter-6.9 mm Tortuosity-mild Calcification-moderate Left External Iliac Artery - Minimal Diameter-8.3 mm Tortuosity-mild Calcification-none Left Common Femoral Artery - Minimal Diameter-7.3 mm Tortuosity-none Calcification-moderate Review of the MIP images confirms the above findings.  IMPRESSION: Vascular: 1. Vascular findings and measurements pertinent to potential TAVR procedure, as detailed above. 2. 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 3. 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 4. Three-vessel coronary artery disease. Nonvascular: 1. Pulmonary edema, Improved when compared with the prior exam. 2. Moderate bilateral pleural effusions and bibasilar atelectasis, similar to prior. 3. Small solid subpleural nodule of the left upper lobe measuring 4 mm. No follow-up needed if patient is low-risk.This recommendation follows the consensus statement: Guidelines for Management of Incidental Pulmonary Nodules Detected on CT Images: From the Fleischner Society 2017; Radiology 2017; 284:228-243. Electronically Signed   By: Allegra Lai M.D.   On: 03/03/2023 11:16   CT Angio Abd/Pel w/ and/or w/o  Result Date: 03/03/2023 CLINICAL DATA:  Aortic valve replacement preop evaluation EXAM: CT ANGIOGRAPHY CHEST, ABDOMEN AND PELVIS TECHNIQUE: Non-contrast CT of the chest was initially obtained. Multidetector CT imaging through the chest, abdomen and pelvis was performed using the standard protocol during bolus administration of intravenous contrast. Multiplanar reconstructed images and MIPs were obtained and reviewed to evaluate the vascular anatomy. RADIATION DOSE REDUCTION: This exam was performed according to the departmental dose-optimization program which includes automated exposure control, adjustment of the mA and/or kV according to patient size and/or use of iterative reconstruction technique. CONTRAST:  OMNIPAQUE IOHEXOL 350 MG/ML SOLN COMPARISON:  Chest CTA dated February 27, 2023 FINDINGS: CTA CHEST FINDINGS Cardiovascular: Cardiomegaly. No pericardial effusion. 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  Three-vessel coronary artery calcifications. Mediastinum/Nodes: Esophagus and thyroid are unremarkable. Mildly enlarged mediastinal lymph nodes which are likely reactive. Reference subcarinal lymph node measuring 1.3 cm in short axis on series 3, image 94. Lungs/Pleura: Central airways are patent. Central predominant  ground-glass opacities, decreased when compared with the prior exam. Moderate bilateral pleural effusions atelectasis. No pneumothorax. Small solid subpleural nodule of the left upper lobe measuring 4 mm on series 7, image 53. Musculoskeletal: Prior median sternotomy. Mild no chest wall abnormality. No acute or significant osseous findings. CTA ABDOMEN AND PELVIS FINDINGS Hepatobiliary: No focal liver abnormality is seen. No gallstones, gallbladder wall thickening, or biliary dilatation. Pancreas: Unremarkable. No pancreatic ductal dilatation or surrounding inflammatory changes. Spleen: Normal in size without focal abnormality. Adrenals/Urinary Tract: Bilateral adrenal glands are unremarkable. No hydronephrosis or nephrolithiasis. Bladder is unremarkable. Stomach/Bowel: Stomach is within normal limits. No evidence of bowel wall thickening, distention, or inflammatory changes. Vascular/lymphatic: Normal caliber abdominal aorta with severe atherosclerotic disease including areas of ulcerated soft plaque. 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 Other major branch vessels of the abdominal aorta are patent. No pathologically enlarged lymph nodes seen in the chest. Reproductive: Prostate is unremarkable. Other: No abdominal wall hernia or abnormality. No abdominopelvic ascites. Musculoskeletal: No acute or significant osseous findings. VASCULAR MEASUREMENTS PERTINENT TO TAVR: AORTA: Minimal Aortic Diameter-13.7 mm Severity of Aortic Calcification-moderate RIGHT PELVIS: Right Common Iliac Artery - Minimal Diameter-7.9 mm Tortuosity-severe Calcification-moderate  Right External Iliac Artery - Minimal Diameter-8.6 mm Tortuosity-mild Calcification-none Right Common Femoral Artery - Minimal Diameter-7.7 mm Tortuosity-none Calcification-moderate LEFT PELVIS: Left Common Iliac Artery - Minimal Diameter-6.9 mm Tortuosity-mild Calcification-moderate Left External Iliac Artery - Minimal Diameter-8.3 mm Tortuosity-mild Calcification-none Left Common Femoral Artery - Minimal Diameter-7.3 mm Tortuosity-none Calcification-moderate Review of the MIP images confirms the above findings. IMPRESSION: Vascular: 1. Vascular findings and measurements pertinent to potential TAVR procedure, as detailed above. 2. 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 3. 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 4. Three-vessel coronary artery disease. Nonvascular: 1. Pulmonary edema, Improved when compared with the prior exam. 2. Moderate bilateral pleural effusions and bibasilar atelectasis, similar to prior. 3. Small solid subpleural nodule of the left upper lobe measuring 4 mm. No follow-up needed if patient is low-risk.This recommendation follows the consensus statement: Guidelines for Management of Incidental Pulmonary Nodules Detected on CT Images: From the Fleischner Society 2017; Radiology 2017; 284:228-243. Electronically Signed   By: Allegra Lai M.D.   On: 03/03/2023 11:16    Cardiac Studies   ECHO 02/28/23 IMPRESSIONS  1. Left ventricular ejection fraction, by estimation, is 60 to 65%. The  left ventricle has normal function. The left ventricle has no regional  wall motion abnormalities. Left ventricular diastolic parameters are  indeterminate.   2. Right ventricular systolic function is normal. The right ventricular  size is mildly enlarged.   3. Left atrial size was mild to moderately dilated.   4. Right atrial size was moderately  dilated.   5. The mitral valve is degenerative. Mild mitral valve regurgitation. No  evidence of mitral stenosis. Moderate mitral annular calcification.   6. Tricuspid valve regurgitation is mild to moderate.   7. The aortic valve has been repaired/replaced. Aortic valve  regurgitation is severe. Mild aortic valve stenosis. There is a 21 mm  Magna pericardial valve present in the aortic position. Procedure Date:  11/2006. Aortic regurgitation PHT measures 147  msec. Aortic valve area, by VTI measures 1.46 cm. Aortic valve mean  gradient measures 20.0 mmHg. Aortic valve Vmax measures 3.08 m/s.   8. The inferior vena cava is dilated in size with <50% respiratory  variability, suggesting right atrial pressure of 15 mmHg.   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 Suggest TEE to further evalaute.   Patient Profile     77 y.o. male followed by Dr. Fransisco Beau with [facility] with a hx of CAD, DM2, hx of bioprosthetic aortic valve repair in 2008, hx of ascending aorta repair, centrilobular emphysema, celiac disease, hyperglycemia, sleep disorder, pancytopenia now admitted with PNA and CHF and chest pain.     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 Suggest TEE to further evalaute.   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 PVL area  -  TEE today to make sure AR is central and not PVL - Has been seen by Leafy Ro and CM - Will discuss at Structural meeting next Tuesday - Hopefully can proceed with TAVR in next 2-4 weeks if no PVL  Fever No further fever BC x 2 NGTD after 3 days   Pulmonary edema Acute respiratory failure with hypoxia BNP 745 Echo with LVEF 60-65%, BAE, mild MR, AI - has  diuresed with 40 mg IV lasix BID with 4L urine output yesterday - now on room air - appears euvolemic on exam - PO lasix 40 mg daily    CAD Known D1 disease with failed PCI in the past Elevated troponin felt demand ischemia Last heart cath 01/2023 at [facility] after false positive nuclear stress test showed nonobstructive disease, elevated LVEDP - on low dose imdur and ranexa 1000 mg BID, 12.5 mg toprol - continue ASA   Chest tightness Pt reports chest tightness sometimes relieved with benzodiazepine - in May, had left arm pain and numbness radiating to left chest, but on the day after playing golf - attempted to assess activity level, does not climb stairs and does not walk in big box stores, plays golf once weekly but rides golf cart - reassuring heart cath as above   Hypertension Maintained on hydralazine 25 mg BID, imdur 15 mg,   Allergy to amlodipine   Hyperlipidemia with LDL goal < 70 Continue 80 mg lipitor  Possible d/c Saturday with TAVR hopefully to be scheduled within the next 2-4 weeks     For questions or updates, please contact [facility] Please consult [email or web address] for contact info under        Signed, Charlton Haws, MD  03/04/2023, 8:16 AM

## 2023-03-04 NOTE — CV Procedure (Signed)
    TRANSESOPHAGEAL ECHOCARDIOGRAM   NAME:  RAYMAR JOINER    MRN: [id number] DOB:  04/24/1946    ADMIT DATE: 02/27/2023  INDICATIONS: Severe aortic regurgitation  PROCEDURE:   Informed consent was obtained prior to the procedure. The risks, benefits and alternatives for the procedure were discussed and the patient comprehended these risks.  Risks include, but are not limited to, cough, sore throat, vomiting, nausea, somnolence, esophageal and stomach trauma or perforation, bleeding, low blood pressure, aspiration, pneumonia, infection, trauma to the teeth and death.    Procedural time out performed. The oropharynx was anesthetized with topical 1% benzocaine.    Anesthesia was administered by Dr. Isaias Cowman and team.  The patient was administered a total of Propofol 280 mg to achieve and maintain moderate to deep conscious sedation.  [name] heart rate, blood pressure, and oxygen saturation are monitored continuously during the procedure. The period of conscious sedation is 24 minutes, of which I was present face-to-face 100% of this time.   The transesophageal probe was inserted in the esophagus and stomach without difficulty and multiple views were obtained.   COMPLICATIONS:    There were no immediate complications.  KEY FINDINGS:  Severe prosthetic aortic regurgitation.  Predominant regurgitation mechanism is central in origin.  Full report to follow. Further management per primary team.   Riley Lam, MD [place]  [facility]  1:25 PM

## 2023-03-04 NOTE — Progress Notes (Signed)
PROGRESS NOTE        PATIENT DETAILS Name: Randy Duncan Age: 77 y.o. Sex: male Date of Birth: 05/22/1946 Admit Date: 02/27/2023 Admitting Physician Ankit Joline Maxcy, MD WUJ:[id number], Onalee Hua, MD  Brief Summary: Patient is a 77 y.o.  male with history of bioprosthetic aortic valve replacement in 2008, HTN, CAD-presenting with shortness of breath-found to have acute hypoxic respiratory failure due to acute on chronic HFpEF in the setting of severe aortic regurgitation  Significant events: 6/16>> admit to [facility] 6/19>> transferred to [facility] for structural cardiology evaluation/TEE/workup  Significant studies: 616>> CXR: CHF 6/16>> CTA chest: No PE-pulm edema 6/17>> echo: EF 60-65%, severe AR  Significant microbiology data: 6/16>>/influenza/RSV PCR: Negative 6/16>> respiratory virus panel: Negative 6/16>> blood culture: No growth  Procedures: None  Consults: Cardiology  Subjective: Lying comfortably in bed.  No complaints.  Objective: Vitals: Blood pressure (!) 127/41, pulse 71, temperature 98 F (36.7 C), temperature source Temporal, resp. rate 18, height 5\' 7"  (1.702 m), weight 72.9 kg, SpO2 95 %.   Exam: Gen Exam:Alert awake-not in any distress HEENT:atraumatic, normocephalic Chest: B/L clear to auscultation anteriorly CVS:S1S2 regular Abdomen:soft non tender, non distended Extremities:no edema Neurology: Non focal Skin: no rash  Pertinent Labs/Radiology:    Latest Ref Rng & Units 03/02/2023    8:02 AM 03/02/2023    3:10 AM 03/01/2023    3:06 AM  CBC  WBC 4.0 - 10.5 K/uL 10.0  8.7  8.3   Hemoglobin 13.0 - 17.0 g/dL 78.2  95.6  21.3   Hematocrit 39.0 - 52.0 % 31.4  29.9  29.0   Platelets 150 - 400 K/uL 175  153  140     Lab Results  Component Value Date   NA 133 (L) 03/04/2023   K 3.9 03/04/2023   CL 100 03/04/2023   CO2 27 03/04/2023      Assessment/Plan: Acute hypoxic respiratory failure due to HFpEF  exacerbation Euvolemic-now on oral Lasix  Remains on room air.  Severe aortic regurgitation History of bioprosthetic aortic valve replacement 2008  TEE scheduled later today Cardiology and structural heart team following with plans for outpatient TAVR in the next 2-4 weeks.  CAD No anginal symptoms Aspirin/statin/metoprolol  Hyponatremia In the setting of CHF Improved-mild-follow electrolytes  HTN BP stable on Toprol/hydralazine/Imdur  Normocytic anemia Likely due to acute illness Follow CBC  OSA BiPAP nightly  Mild protein calorie malnutrition  Small solid subpleural nodule left upper lobe 4 mm Stable for outpatient follow-up by PCP  Pressure Ulcer: Pressure Injury 02/27/23 Sacrum Stage 1 -  Intact skin with non-blanchable redness of a localized area usually over a bony prominence. (Active)  02/27/23 1739  Location: Sacrum  Location Orientation:   Staging: Stage 1 -  Intact skin with non-blanchable redness of a localized area usually over a bony prominence.  Wound Description (Comments):   Present on Admission: Yes  Dressing Type Foam - Lift dressing to assess site every shift 03/04/23 0750    BMI: Estimated body mass index is 25.17 kg/m as calculated from the following:   Height as of this encounter: 5\' 7"  (1.702 m).   Weight as of this encounter: 72.9 kg.   Code status:   Code Status: Full Code   DVT Prophylaxis: SCDs Start: 02/27/23 1621  Family Communication: None at bedside  Disposition Plan: Status  is: Inpatient Remains inpatient appropriate because: Severity of illness   Planned Discharge Destination:Home likely 6/22.   Diet: Diet Order             Diet NPO time specified  Diet effective midnight                     Antimicrobial agents: Anti-infectives (From admission, onward)    Start     Dose/Rate Route Frequency Ordered Stop   02/27/23 1645  cefTRIAXone (ROCEPHIN) 1 g in sodium chloride 0.9 % 100 mL IVPB  Status:   Discontinued        1 g 200 mL/hr over 30 Minutes Intravenous Daily 02/27/23 1622 02/28/23 0826   02/27/23 1645  azithromycin (ZITHROMAX) 500 mg in sodium chloride 0.9 % 250 mL IVPB  Status:  Discontinued        500 mg 250 mL/hr over 60 Minutes Intravenous Daily 02/27/23 1622 02/28/23 0826   02/27/23 1300  vancomycin (VANCOREADY) IVPB 1500 mg/300 mL        1,500 mg 150 mL/hr over 120 Minutes Intravenous STAT 02/27/23 1250 02/27/23 1551   02/27/23 1300  ceFEPIme (MAXIPIME) 2 g in sodium chloride 0.9 % 100 mL IVPB        2 g 200 mL/hr over 30 Minutes Intravenous STAT 02/27/23 1250 02/27/23 1330        MEDICATIONS: Scheduled Meds:  [MAR Hold] albuterol  2.5 mg Nebulization BID   [MAR Hold] aspirin EC  81 mg Oral Daily   [MAR Hold] atorvastatin  80 mg Oral QHS   [MAR Hold] Chlorhexidine Gluconate Cloth  6 each Topical Q2000   [MAR Hold] furosemide  40 mg Oral Daily   [MAR Hold] hydrALAZINE  25 mg Oral BID   [MAR Hold] isosorbide mononitrate  15 mg Oral Daily   [MAR Hold] metoprolol succinate  12.5 mg Oral Daily   [MAR Hold] mouth rinse  15 mL Mouth Rinse 4 times per day   [MAR Hold] pantoprazole  40 mg Oral Daily   [MAR Hold] ranolazine  1,000 mg Oral BID   [MAR Hold] traZODone  25 mg Oral QHS   Continuous Infusions:  [MAR Hold] sodium chloride 10 mL/hr at 02/28/23 1807   sodium chloride 20 mL/hr at 03/04/23 0610   PRN Meds:.[MAR Hold] sodium chloride, [MAR Hold] acetaminophen **OR** [MAR Hold] acetaminophen, [MAR Hold] alum & mag hydroxide-simeth, [MAR Hold] clonazePAM, [MAR Hold] docusate sodium, [MAR Hold] guaiFENesin, [MAR Hold] hydrALAZINE, [MAR Hold] ipratropium-albuterol, [MAR Hold] metoprolol tartrate, [MAR Hold] nitroGLYCERIN, [MAR Hold] ondansetron **OR** [MAR Hold] ondansetron (ZOFRAN) IV, [MAR Hold] mouth rinse, [MAR Hold] polyethylene glycol   I have personally reviewed following labs and imaging studies  LABORATORY DATA: CBC: Recent Labs  Lab 02/27/23 0920  02/28/23 0306 03/01/23 0306 03/02/23 0310 03/02/23 0802  WBC 11.7* 8.6 8.3 8.7 10.0  NEUTROABS 9.4*  --   --   --   --   HGB 11.2* 9.7* 10.0* 10.6* 11.0*  HCT 32.0* 28.0* 29.0* 29.9* 31.4*  MCV 97.0 97.6 97.6 95.5 95.4  PLT 134* 129* 140* 153 175     Basic Metabolic Panel: Recent Labs  Lab 02/27/23 1949 02/28/23 0306 03/01/23 0306 03/02/23 0310 03/03/23 0422 03/04/23 0407  NA  --  132* 134* 131* 133* 133*  K  --  4.0 3.5 3.6 3.9 3.9  CL  --  103 100 96* 99 100  CO2  --  21* 25 28 27 27   GLUCOSE  --  105* 115* 144* 121* 120*  BUN  --  15 24* 26* 19 15  CREATININE  --  0.94 1.13 1.20 1.06 1.15  CALCIUM  --  7.9* 8.2* 8.0* 8.2* 8.3*  MG 1.8  --   --   --  2.0 2.1  PHOS  --  4.2  --   --   --   --      GFR: Estimated Creatinine Clearance: 50.3 mL/min (by C-G formula based on SCr of 1.15 mg/dL).  Liver Function Tests: Recent Labs  Lab 02/27/23 0920 02/28/23 0306  AST 28 30  ALT 39 39  ALKPHOS 59 46  BILITOT 1.4* 1.0  PROT 6.2* 5.1*  ALBUMIN 3.2* 2.5*    No results for input(s): "LIPASE", "AMYLASE" in the last 168 hours. No results for input(s): "AMMONIA" in the last 168 hours.  Coagulation Profile: No results for input(s): "INR", "PROTIME" in the last 168 hours.  Cardiac Enzymes: No results for input(s): "CKTOTAL", "CKMB", "CKMBINDEX", "TROPONINI" in the last 168 hours.  BNP (last 3 results) No results for input(s): "PROBNP" in the last 8760 hours.  Lipid Profile: No results for input(s): "CHOL", "HDL", "LDLCALC", "TRIG", "CHOLHDL", "LDLDIRECT" in the last 72 hours.  Thyroid Function Tests: No results for input(s): "TSH", "T4TOTAL", "FREET4", "T3FREE", "THYROIDAB" in the last 72 hours.  Anemia Panel: No results for input(s): "VITAMINB12", "FOLATE", "FERRITIN", "TIBC", "IRON", "RETICCTPCT" in the last 72 hours.  Urine analysis:    Component Value Date/Time   COLORURINE COLORLESS (A) 02/27/2023 1252   APPEARANCEUR CLEAR 02/27/2023 1252    APPEARANCEUR Clear 06/20/2013 1326   LABSPEC 1.013 02/27/2023 1252   LABSPEC 1.005 06/20/2013 1326   PHURINE 5.0 02/27/2023 1252   GLUCOSEU NEGATIVE 02/27/2023 1252   GLUCOSEU Negative 06/20/2013 1326   HGBUR NEGATIVE 02/27/2023 1252   BILIRUBINUR NEGATIVE 02/27/2023 1252   BILIRUBINUR Negative 06/20/2013 1326   KETONESUR 5 (A) 02/27/2023 1252   PROTEINUR NEGATIVE 02/27/2023 1252   NITRITE NEGATIVE 02/27/2023 1252   LEUKOCYTESUR NEGATIVE 02/27/2023 1252   LEUKOCYTESUR Negative 06/20/2013 1326    Sepsis Labs: Lactic Acid, Venous    Component Value Date/Time   LATICACIDVEN 0.8 02/27/2023 1210    MICROBIOLOGY: Recent Results (from the past 240 hour(s))  Blood culture (routine x 2)     Status: None   Collection Time: 02/27/23 10:05 AM   Specimen: Right Antecubital; Blood  Result Value Ref Range Status   Specimen Description   Final    RIGHT ANTECUBITAL BLOOD Performed at [facility], [address] [place], [place] [address]    Special Requests   Final    BOTTLES DRAWN AEROBIC AND ANAEROBIC Blood Culture adequate volume Performed at [facility], [address], [place], [place] [address]    Culture   Final    NO GROWTH 5 DAYS Performed at [facility], [address] [place], [place] [address]    Report Status 03/04/2023 FINAL  Final  Blood culture (routine x 2)     Status: None   Collection Time: 02/27/23 10:06 AM   Specimen: BLOOD LEFT ARM  Result Value Ref Range Status   Specimen Description   Final    BLOOD LEFT ARM Performed at [facility], [address], [place], [place] [address]    Special Requests   Final    BOTTLES DRAWN AEROBIC AND ANAEROBIC Blood Culture adequate volume Performed at [facility], [address] [place], [place] [address]  Culture   Final    NO GROWTH 5 DAYS Performed at [facility], [address], [place], [place] [address]    Report Status  03/04/2023 FINAL  Final  Resp panel by RT-PCR (RSV, Flu A&B, Covid) Anterior Nasal Swab     Status: None   Collection Time: 02/27/23 12:43 PM   Specimen: Anterior Nasal Swab  Result Value Ref Range Status   SARS Coronavirus 2 by RT PCR NEGATIVE NEGATIVE Final    Comment: (NOTE) SARS-CoV-2 target nucleic acids are NOT DETECTED.  The SARS-CoV-2 RNA is generally detectable in upper respiratory specimens during the acute phase of infection. The lowest concentration of SARS-CoV-2 viral copies this assay can detect is 138 copies/mL. A negative result does not preclude SARS-Cov-2 infection and should not be used as the sole basis for treatment or other patient management decisions. A negative result may occur with  improper specimen collection/handling, submission of specimen other than nasopharyngeal swab, presence of viral mutation(s) within the areas targeted by this assay, and inadequate number of viral copies(<138 copies/mL). A negative result must be combined with clinical observations, patient history, and epidemiological information. The expected result is Negative.  Fact Sheet for Patients:  BloggerCourse.com  Fact Sheet for Healthcare Providers:  SeriousBroker.it  This test is no t yet approved or cleared by the [place] FDA and  has been authorized for detection and/or diagnosis of SARS-CoV-2 by FDA under an Emergency Use Authorization (EUA). This EUA will remain  in effect (meaning this test can be used) for the duration of the COVID-19 declaration under Section 564(b)(1) of the Act, 21 U.S.C.section 360bbb-3(b)(1), unless the authorization is terminated  or revoked sooner.       Influenza A by PCR NEGATIVE NEGATIVE Final   Influenza B by PCR NEGATIVE NEGATIVE Final    Comment: (NOTE) The Xpert Xpress SARS-CoV-2/FLU/RSV plus assay is intended as an aid in the diagnosis of influenza from Nasopharyngeal swab specimens  and should not be used as a sole basis for treatment. Nasal washings and aspirates are unacceptable for Xpert Xpress SARS-CoV-2/FLU/RSV testing.  Fact Sheet for Patients: BloggerCourse.com  Fact Sheet for Healthcare Providers: SeriousBroker.it  This test is not yet approved or cleared by the [place] FDA and has been authorized for detection and/or diagnosis of SARS-CoV-2 by FDA under an Emergency Use Authorization (EUA). This EUA will remain in effect (meaning this test can be used) for the duration of the COVID-19 declaration under Section 564(b)(1) of the Act, 21 U.S.C. section 360bbb-3(b)(1), unless the authorization is terminated or revoked.     Resp Syncytial Virus by PCR NEGATIVE NEGATIVE Final    Comment: (NOTE) Fact Sheet for Patients: BloggerCourse.com  Fact Sheet for Healthcare Providers: SeriousBroker.it  This test is not yet approved or cleared by the [place] FDA and has been authorized for detection and/or diagnosis of SARS-CoV-2 by FDA under an Emergency Use Authorization (EUA). This EUA will remain in effect (meaning this test can be used) for the duration of the COVID-19 declaration under Section 564(b)(1) of the Act, 21 U.S.C. section 360bbb-3(b)(1), unless the authorization is terminated or revoked.  Performed at [facility], [address], [place], [place] [address]   Respiratory (~20 pathogens) panel by PCR     Status: None   Collection Time: 02/27/23 12:43 PM   Specimen: Nasopharyngeal Swab; Respiratory  Result Value Ref Range Status   Adenovirus NOT DETECTED NOT DETECTED Final   Coronavirus 229E NOT DETECTED NOT  DETECTED Final    Comment: (NOTE) The Coronavirus on the Respiratory Panel, DOES NOT test for the novel  Coronavirus (2019 nCoV)    Coronavirus HKU1 NOT DETECTED NOT DETECTED Final   Coronavirus NL63 NOT  DETECTED NOT DETECTED Final   Coronavirus OC43 NOT DETECTED NOT DETECTED Final   Metapneumovirus NOT DETECTED NOT DETECTED Final   Rhinovirus / Enterovirus NOT DETECTED NOT DETECTED Final   Influenza A NOT DETECTED NOT DETECTED Final   Influenza B NOT DETECTED NOT DETECTED Final   Parainfluenza Virus 1 NOT DETECTED NOT DETECTED Final   Parainfluenza Virus 2 NOT DETECTED NOT DETECTED Final   Parainfluenza Virus 3 NOT DETECTED NOT DETECTED Final   Parainfluenza Virus 4 NOT DETECTED NOT DETECTED Final   Respiratory Syncytial Virus NOT DETECTED NOT DETECTED Final   Bordetella pertussis NOT DETECTED NOT DETECTED Final   Bordetella Parapertussis NOT DETECTED NOT DETECTED Final   Chlamydophila pneumoniae NOT DETECTED NOT DETECTED Final   Mycoplasma pneumoniae NOT DETECTED NOT DETECTED Final    Comment: Performed at [facility], [address], [place], [place] [address]  MRSA Next Gen by PCR, Nasal     Status: None   Collection Time: 02/28/23  8:57 AM   Specimen: Nasal Mucosa; Nasal Swab  Result Value Ref Range Status   MRSA by PCR Next Gen NOT DETECTED NOT DETECTED Final    Comment: (NOTE) The GeneXpert MRSA Assay (FDA approved for NASAL specimens only), is one component of a comprehensive MRSA colonization surveillance program. It is not intended to diagnose MRSA infection nor to guide or monitor treatment for MRSA infections. Test performance is not FDA approved in patients less than 49 years old. Performed at [facility], [address], [place], [place] [address]     RADIOLOGY STUDIES/RESULTS: DG Orthopantogram  Result Date: 03/03/2023 CLINICAL DATA:  Poor dentition. EXAM: ORTHOPANTOGRAM/PANORAMIC COMPARISON:  None Available. FINDINGS: Very poor dentition is noted with many missing teeth. No definite fracture or lytic lesion is seen involving the mandible. At least 1 tooth in the left mandible has significant erosion of its crown. IMPRESSION: No definite  fracture or lytic lesion is seen involving the mandible. Electronically Signed   By: Lupita Raider M.D.   On: 03/03/2023 15:41   CT CORONARY MORPH W/CTA COR W/SCORE W/CA W/CM &/OR WO/CM  Addendum Date: 03/03/2023   ADDENDUM REPORT: 03/03/2023 12:15 CLINICAL DATA:  Pre Valve in Valve TAVR EXAM: Cardiac TAVR CT TECHNIQUE: The patient was scanned on a Siemens Force 192 slice scanner. A 120 kV retrospective scan was triggered in the descending thoracic aorta at 111 HU's. Gantry rotation speed was 270 msecs and collimation was .9 mm. No beta blockade or nitro were given. The 3D data set was reconstructed in 5% intervals of the R-R cycle. Systolic and diastolic phases were analyzed on a dedicated work station using MPR, MIP and VRT modes. The patient received 80 cc of contrast. FINDINGS: Aortic Valve: Has been replaced in 2008. 21 mm Edwards 3000 TFX Perimount Magna stented bioprosthetic valve The stent ID is 20 mm the valve height is 15 mm And the True ID is 19 mm The leaflets are thickened with mild calcification there is some prolapse of the non coronary cusp. There is a possible area of annular dehiscence near the left cusp measuring 4.6 mm x 3.7 mm with area of 0.15 cm2. Inferior to this in the LVOT is a large area of calcium at the base of the  anterior mitral leaflet and intervalvular fibrosa. Aorta: There appears to be a straight graft repair of ascending thoracic aorta above the AVR Suture lines intact with no aneurysm Sinotubular Junction: 28 mm Ascending Thoracic Aorta: 29.5 mm Aortic Arch: 31 nn Descending Thoracic Aorta: Very tortuous 25 mm Sinus of Valsalva Measurements: Non-coronary: 33.3 mm Right - coronary: 30.6 mm Left - coronary: 32 mm Coronary Artery Height above Annulus: Left Main: 20.3 mm above annulus Right Coronary: 17.5 mm above annulus Coronary Arteries: Sufficient height above annulus for deployment The coronary arteries arise aboe the embedded geometry of the valve and do Not appear to be  at risk for occlusion Optimum Fluoroscopic Angle for Delivery: RAO 7 Caudal 13 degrees Without balloon fracture a 20 mm Sapien 3 valve appears to fit the ID of the valve better If the valve is dilated with a 22 balloon possible use of 23 Sapien valve IMPRESSION: 1. 77 yo Edwards 3000 TFX Perimount Magna Valve with thickened leaflets and mild calcium. 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 Coronary arteries quite high and not at risk for occlusion with valve in valve procedure being above simulated valve implant with Sapien 3. Valve in Valve app suggests using 20 mm Sapien vs fracture with 22 balloon and using a 23 mm Sapien. Embedded geometry confirms that to fit a 23 mm valve the annulus would need balloon fracture 4. 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 Optimum angiographic angle for deployment RAO 7 Caudal 13 degrees with aortic root contrast injection to try and visualize hinge points for leaflets Charlton Haws Electronically Signed   By: Charlton Haws M.D.   On: 03/03/2023 12:15   Result Date: 03/03/2023 EXAM: OVER-READ INTERPRETATION  CT CHEST The following report is a limited chest CT over-read performed by radiologist Dr. Allegra Lai of [facility] on 03/03/2023. This over-read does not include interpretation of cardiac or coronary anatomy or pathology. The cardiac TAVR interpretation by the cardiologist is attached. COMPARISON:  None Available. FINDINGS: Extracardiac findings will be described separately under dictation for contemporaneously obtained CTA chest, abdomen and pelvis. IMPRESSION: Please see separate dictation for contemporaneously obtained CTA chest, abdomen and pelvis dated 03/03/2023 for full  description of relevant extracardiac findings. Electronically Signed: By: Allegra Lai M.D. On: 03/03/2023 11:22   CT ANGIO CHEST AORTA W/CM & OR WO/CM  Result Date: 03/03/2023 CLINICAL DATA:  Aortic valve replacement preop evaluation EXAM: CT ANGIOGRAPHY CHEST, ABDOMEN AND PELVIS TECHNIQUE: Non-contrast CT of the chest was initially obtained. Multidetector CT imaging through the chest, abdomen and pelvis was performed using the standard protocol during bolus administration of intravenous contrast. Multiplanar reconstructed images and MIPs were obtained and reviewed to evaluate the vascular anatomy. RADIATION DOSE REDUCTION: This exam was performed according to the departmental dose-optimization program which includes automated exposure control, adjustment of the mA and/or kV according to patient size and/or use of iterative reconstruction technique. CONTRAST:  OMNIPAQUE IOHEXOL 350 MG/ML SOLN COMPARISON:  Chest CTA dated February 27, 2023 FINDINGS: CTA CHEST FINDINGS Cardiovascular: Cardiomegaly. No pericardial effusion. 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 Three-vessel coronary artery calcifications. Mediastinum/Nodes: Esophagus and thyroid are unremarkable. Mildly enlarged mediastinal lymph nodes which are  likely reactive. Reference subcarinal lymph node measuring 1.3 cm in short axis on series 3, image 94. Lungs/Pleura: Central airways are patent. Central predominant ground-glass opacities, decreased when compared with the prior exam. Moderate bilateral pleural effusions atelectasis. No pneumothorax. Small solid subpleural nodule of the left upper lobe measuring 4 mm on series 7, image 53. Musculoskeletal: Prior median sternotomy. Mild no chest wall abnormality. No acute or significant osseous findings. CTA ABDOMEN AND PELVIS FINDINGS Hepatobiliary: No focal liver abnormality is seen. No gallstones, gallbladder wall thickening, or biliary dilatation.  Pancreas: Unremarkable. No pancreatic ductal dilatation or surrounding inflammatory changes. Spleen: Normal in size without focal abnormality. Adrenals/Urinary Tract: Bilateral adrenal glands are unremarkable. No hydronephrosis or nephrolithiasis. Bladder is unremarkable. Stomach/Bowel: Stomach is within normal limits. No evidence of bowel wall thickening, distention, or inflammatory changes. Vascular/lymphatic: Normal caliber abdominal aorta with severe atherosclerotic disease including areas of ulcerated soft plaque. 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 Other major branch vessels of the abdominal aorta are patent. No pathologically enlarged lymph nodes seen in the chest. Reproductive: Prostate is unremarkable. Other: No abdominal wall hernia or abnormality. No abdominopelvic ascites. Musculoskeletal: No acute or significant osseous findings. VASCULAR MEASUREMENTS PERTINENT TO TAVR: AORTA: Minimal Aortic Diameter-13.7 mm Severity of Aortic Calcification-moderate RIGHT PELVIS: Right Common Iliac Artery - Minimal Diameter-7.9 mm Tortuosity-severe Calcification-moderate Right External Iliac Artery - Minimal Diameter-8.6 mm Tortuosity-mild Calcification-none Right Common Femoral Artery - Minimal Diameter-7.7 mm Tortuosity-none Calcification-moderate LEFT PELVIS: Left Common Iliac Artery - Minimal Diameter-6.9 mm Tortuosity-mild Calcification-moderate Left External Iliac Artery - Minimal Diameter-8.3 mm Tortuosity-mild Calcification-none Left Common Femoral Artery - Minimal Diameter-7.3 mm Tortuosity-none Calcification-moderate Review of the MIP images confirms the above findings. IMPRESSION: Vascular: 1. Vascular findings and measurements pertinent to potential TAVR procedure, as detailed above. 2. 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 3. Severe aortoiliac atherosclerosis consisting of calcified and noncalcified plaque,  including areas of ulcerated soft plaque of the abdominal aorta. 4. Three-vessel coronary artery disease. Nonvascular: 1. Pulmonary edema, Improved when compared with the prior exam. 2. Moderate bilateral pleural effusions and bibasilar atelectasis, similar to prior. 3. Small solid subpleural nodule of the left upper lobe measuring 4 mm. No follow-up needed if patient is low-risk.This recommendation follows the consensus statement: Guidelines for Management of Incidental Pulmonary Nodules Detected on CT Images: From the Fleischner Society 2017; Radiology 2017; 284:228-243. Electronically Signed   By: Allegra Lai M.D.   On: 03/03/2023 11:16   CT Angio Abd/Pel w/ and/or w/o  Result Date: 03/03/2023 CLINICAL DATA:  Aortic valve replacement preop evaluation EXAM: CT ANGIOGRAPHY CHEST, ABDOMEN AND PELVIS TECHNIQUE: Non-contrast CT of the chest was initially obtained. Multidetector CT imaging through the chest, abdomen and pelvis was performed using the standard protocol during bolus administration of intravenous contrast. Multiplanar reconstructed images and MIPs were obtained and reviewed to evaluate the vascular anatomy. RADIATION DOSE REDUCTION: This exam was performed according to the departmental dose-optimization program which includes automated exposure control, adjustment of the mA and/or kV according to patient size and/or use of iterative reconstruction technique. CONTRAST:  OMNIPAQUE IOHEXOL 350 MG/ML SOLN COMPARISON:  Chest CTA dated February 27, 2023 FINDINGS: CTA CHEST FINDINGS Cardiovascular: Cardiomegaly. No pericardial effusion. 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 Three-vessel coronary artery calcifications. Mediastinum/Nodes: Esophagus and thyroid are unremarkable. Mildly enlarged mediastinal lymph nodes which are likely reactive. Reference subcarinal lymph node measuring 1.3 cm in short axis  on series 3, image 94. Lungs/Pleura: Central airways are  patent. Central predominant ground-glass opacities, decreased when compared with the prior exam. Moderate bilateral pleural effusions atelectasis. No pneumothorax. Small solid subpleural nodule of the left upper lobe measuring 4 mm on series 7, image 53. Musculoskeletal: Prior median sternotomy. Mild no chest wall abnormality. No acute or significant osseous findings. CTA ABDOMEN AND PELVIS FINDINGS Hepatobiliary: No focal liver abnormality is seen. No gallstones, gallbladder wall thickening, or biliary dilatation. Pancreas: Unremarkable. No pancreatic ductal dilatation or surrounding inflammatory changes. Spleen: Normal in size without focal abnormality. Adrenals/Urinary Tract: Bilateral adrenal glands are unremarkable. No hydronephrosis or nephrolithiasis. Bladder is unremarkable. Stomach/Bowel: Stomach is within normal limits. No evidence of bowel wall thickening, distention, or inflammatory changes. Vascular/lymphatic: Normal caliber abdominal aorta with severe atherosclerotic disease including areas of ulcerated soft plaque. 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 Other major branch vessels of the abdominal aorta are patent. No pathologically enlarged lymph nodes seen in the chest. Reproductive: Prostate is unremarkable. Other: No abdominal wall hernia or abnormality. No abdominopelvic ascites. Musculoskeletal: No acute or significant osseous findings. VASCULAR MEASUREMENTS PERTINENT TO TAVR: AORTA: Minimal Aortic Diameter-13.7 mm Severity of Aortic Calcification-moderate RIGHT PELVIS: Right Common Iliac Artery - Minimal Diameter-7.9 mm Tortuosity-severe Calcification-moderate Right External Iliac Artery - Minimal Diameter-8.6 mm Tortuosity-mild Calcification-none Right Common Femoral Artery - Minimal Diameter-7.7 mm Tortuosity-none Calcification-moderate LEFT PELVIS: Left Common Iliac Artery - Minimal Diameter-6.9 mm Tortuosity-mild Calcification-moderate Left  External Iliac Artery - Minimal Diameter-8.3 mm Tortuosity-mild Calcification-none Left Common Femoral Artery - Minimal Diameter-7.3 mm Tortuosity-none Calcification-moderate Review of the MIP images confirms the above findings. IMPRESSION: Vascular: 1. Vascular findings and measurements pertinent to potential TAVR procedure, as detailed above. 2. 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 3. 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 4. Three-vessel coronary artery disease. Nonvascular: 1. Pulmonary edema, Improved when compared with the prior exam. 2. Moderate bilateral pleural effusions and bibasilar atelectasis, similar to prior. 3. Small solid subpleural nodule of the left upper lobe measuring 4 mm. No follow-up needed if patient is low-risk.This recommendation follows the consensus statement: Guidelines for Management of Incidental Pulmonary Nodules Detected on CT Images: From the Fleischner Society 2017; Radiology 2017; 284:228-243. Electronically Signed   By: Allegra Lai M.D.   On: 03/03/2023 11:16     LOS: 5 days   Jeoffrey Massed, MD  [facility]    To contact the attending provider between 7A-7P or the covering provider during after hours 7P-7A, please log into the web site [email or web address] and access using universal [place] password for that web site. If you do not have the password, please call the hospital operator.  03/04/2023, 12:06 PM

## 2023-03-04 NOTE — TOC Progression Note (Signed)
Transition of Care [facility]) - Progression Note    Patient Details  Name: Randy Duncan MRN: [id number] Date of Birth: 07-Sep-1946  Transition of Care [facility]) CM/SW Contact  Gordy Clement, RN Phone Number: 03/04/2023, 8:36 AM  Clinical Narrative:     Patient  is being recommended Home Health PT. Frances Furbish will provide services. No DME is recommended   TOC will continue to follow patient for any additional discharge needs      Expected Discharge Plan:  (To be determined) Barriers to Discharge: Continued Medical Work up  Expected Discharge Plan and Services In-house Referral: Clinical Social Work     Living arrangements for the past 2 months: Mobile Home                                       Social Determinants of Health (SDOH) Interventions SDOH Screenings   Food Insecurity: No Food Insecurity (03/02/2023)  Housing: Patient Declined (03/02/2023)  Transportation Needs: No Transportation Needs (03/02/2023)  Utilities: Not At Risk (03/02/2023)  Tobacco Use: Medium Risk (03/03/2023)    Readmission Risk Interventions    02/28/2023    9:37 AM  Readmission Risk Prevention Plan  Transportation Screening Complete  HRI or Home Care Consult Complete  Social Work Consult for Recovery Care Planning/Counseling Complete  Palliative Care Screening Not Applicable  Medication Review Oceanographer) Complete

## 2023-03-04 NOTE — Transfer of Care (Signed)
Immediate Anesthesia Transfer of Care Note  Patient: Randy Duncan  Procedure(s) Performed: TRANSESOPHAGEAL ECHOCARDIOGRAM  Patient Location: Cath Lab  Anesthesia Type:MAC  Level of Consciousness: awake  Airway & Oxygen Therapy: Patient Spontanous Breathing  Post-op Assessment: Report given to RN and Post -op Vital signs reviewed and stable  Post vital signs: Reviewed and stable  Last Vitals:  Vitals Value Taken Time  BP 98/37 (55)   Temp    Pulse 80   Resp 17   SpO2 93     Last Pain:  Vitals:   03/04/23 1131  TempSrc: Temporal  PainSc: 0-No pain      Patients Stated Pain Goal: 0 (02/28/23 0300)  Complications: No notable events documented.

## 2023-03-05 ENCOUNTER — Other Ambulatory Visit (HOSPITAL_COMMUNITY): Payer: Self-pay

## 2023-03-05 DIAGNOSIS — I10 Essential (primary) hypertension: Secondary | ICD-10-CM

## 2023-03-05 DIAGNOSIS — Z952 Presence of prosthetic heart valve: Secondary | ICD-10-CM | POA: Diagnosis not present

## 2023-03-05 DIAGNOSIS — J9601 Acute respiratory failure with hypoxia: Secondary | ICD-10-CM | POA: Diagnosis not present

## 2023-03-05 DIAGNOSIS — I5043 Acute on chronic combined systolic (congestive) and diastolic (congestive) heart failure: Secondary | ICD-10-CM | POA: Diagnosis not present

## 2023-03-05 LAB — BASIC METABOLIC PANEL
Anion gap: 8 (ref 5–15)
BUN: 13 mg/dL (ref 8–23)
CO2: 24 mmol/L (ref 22–32)
Calcium: 8.5 mg/dL — ABNORMAL LOW (ref 8.9–10.3)
Chloride: 100 mmol/L (ref 98–111)
Creatinine, Ser: 1.05 mg/dL (ref 0.61–1.24)
GFR, Estimated: >60 mL/min (ref >60)
Glucose, Bld: 128 mg/dL — ABNORMAL HIGH (ref 70–99)
Potassium: 4.6 mmol/L (ref 3.5–5.1)
Sodium: 132 mmol/L — ABNORMAL LOW (ref 135–145)

## 2023-03-05 LAB — CBC
HCT: 30.9 % — ABNORMAL LOW (ref 39.0–52.0)
Hemoglobin: 10.8 g/dL — ABNORMAL LOW (ref 13.0–17.0)
MCH: 33 pg (ref 26.0–34.0)
MCHC: 35 g/dL (ref 30.0–36.0)
MCV: 94.5 fL (ref 80.0–100.0)
Platelets: 199 10*3/uL (ref 150–400)
RBC: 3.27 MIL/uL — ABNORMAL LOW (ref 4.22–5.81)
RDW: 11.8 % (ref 11.5–15.5)
WBC: 8.5 10*3/uL (ref 4.0–10.5)
nRBC: 0 % (ref 0.0–0.2)

## 2023-03-05 LAB — MAGNESIUM: Magnesium: 2.1 mg/dL (ref 1.7–2.4)

## 2023-03-05 MED ORDER — ISOSORBIDE MONONITRATE ER 30 MG PO TB24: 15.0000 mg | ORAL_TABLET | Freq: Every day | ORAL | 2 refills | Status: AC

## 2023-03-05 MED ORDER — FUROSEMIDE 40 MG PO TABS
40.0000 mg | ORAL_TABLET | Freq: Every day | ORAL | 2 refills | Status: DC
  Filled 2023-03-05: qty 30, 30d supply, fill #0

## 2023-03-05 MED ORDER — FUROSEMIDE 40 MG PO TABS: 40.0000 mg | ORAL_TABLET | Freq: Every day | ORAL | 2 refills | Status: DC

## 2023-03-05 MED ORDER — ISOSORBIDE MONONITRATE ER 30 MG PO TB24
15.0000 mg | ORAL_TABLET | Freq: Every day | ORAL | 2 refills | Status: DC
  Filled 2023-03-05: qty 30, 60d supply, fill #0

## 2023-03-05 NOTE — Plan of Care (Signed)
Discharge instructions discussed with patient.  Patient instructed on home medications, restrictions, and follow up appointments. Belongings gathered and sent with patient.  Patients medications to be delivered from [facility].

## 2023-03-05 NOTE — TOC Transition Note (Signed)
Transition of Care [facility]) - CM/SW Discharge Note   Patient Details  Name: Randy Duncan MRN: [id number] Date of Birth: 28-Dec-1945  [facility]) CM/SW Contact:  Ronny Bacon, RN Phone Number: 03/05/2023, 10:20 AM   Clinical Narrative:  Patient being discharged home today. Confirmed with [facility] that patient is under their service.    Final next level of care: Home w Home Health Services Barriers to Discharge: No Barriers Identified   Patient Goals and CMS Choice      Discharge Placement                         Discharge Plan and Services Additional resources added to the After Visit Summary for   In-house Referral: Clinical Social Work                                   Social Determinants of Health (SDOH) Interventions SDOH Screenings   Food Insecurity: No Food Insecurity (03/02/2023)  Housing: Patient Declined (03/02/2023)  Transportation Needs: No Transportation Needs (03/02/2023)  Utilities: Not At Risk (03/02/2023)  Tobacco Use: Medium Risk (03/04/2023)     Readmission Risk Interventions    02/28/2023    9:37 AM  Readmission Risk Prevention Plan  Transportation Screening Complete  HRI or Home Care Consult Complete  Social Work Consult for Recovery Care Planning/Counseling Complete  Palliative Care Screening Not Applicable  Medication Review Oceanographer) Complete

## 2023-03-05 NOTE — Progress Notes (Signed)
Rounding Note    Patient Name: Randy Duncan Date of Encounter: 03/05/2023  [facility] Cardiologist: Charlton Haws, MD    Patient Profile     77 y.o. male followed by Dr. Fransisco Beau with [facility] with a hx of CAD, DM2, hx of bioprosthetic aortic valve repair in 2008, hx of ascending aorta repair, centrilobular emphysema, celiac disease, hyperglycemia, sleep disorder, pancytopenia now admitted with PNA and CHF and chest pain.     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  TEE demonstrated eccentric severe AI "appears to be related to prolapse "EF 60-65% Plan was for discharge with outpatient TAVR and review at valve conference this coming week Subjective  Without complaint   Inpatient Medications    Scheduled Meds:  aspirin EC  81 mg Oral Daily   atorvastatin  80 mg Oral QHS   Chlorhexidine Gluconate Cloth  6 each Topical Q2000   furosemide  40 mg Oral Daily   hydrALAZINE  25 mg Oral BID   isosorbide mononitrate  15 mg Oral Daily   metoprolol succinate  12.5 mg Oral Daily   mouth rinse  15 mL Mouth Rinse 4 times per day   pantoprazole  40 mg Oral Daily   ranolazine  1,000 mg Oral BID   traZODone  25 mg Oral QHS   Continuous Infusions:  sodium chloride 10 mL/hr at 02/28/23 1807   PRN Meds: sodium chloride, acetaminophen **OR** acetaminophen, alum & mag hydroxide-simeth, clonazePAM, docusate sodium, guaiFENesin, hydrALAZINE, ipratropium-albuterol, metoprolol tartrate, nitroGLYCERIN, ondansetron **OR** ondansetron (ZOFRAN) IV, mouth rinse, polyethylene glycol   Vital Signs    Vitals:   03/05/23 0012 03/05/23 0400 03/05/23 0706 03/05/23 0827  BP: (!) 114/44 (!) 125/45  137/61  Pulse: 80 79  76  Resp: 12 15  20   Temp: 97.9 F (36.6 C) 97.6 F (36.4 C)  (!) 97.5 F (36.4 C)  TempSrc: Oral Oral  Oral  SpO2: 95% 93%  92%  Weight:   73 kg   Height:        Intake/Output Summary (Last 24 hours) at 03/05/2023 0948 Last data  filed at 03/05/2023 0500 Gross per 24 hour  Intake 700 ml  Output 700 ml  Net 0 ml       03/05/2023    7:06 AM 03/04/2023    4:09 AM 03/03/2023    1:00 AM  Last 3 Weights  Weight (lbs) 160 lb 15 oz 160 lb 11.5 oz 148 lb 5.9 oz  Weight (kg) 73 kg 72.9 kg 67.3 kg      Telemetry    Sinus in the 70s - Personally Reviewed  ECG    No new tracings - Personally Reviewed  Physical Exam  Well developed and nourished in no acute distress HENT normal Neck supple  Clear Regular rate and rhythm, 2/6 systolic 3/6 holodiastolic  no Clubbing cyanosis edema Skin-warm and dry A & Oriented  Grossly normal sensory and motor function     Labs    High Sensitivity Troponin:   Recent Labs  Lab 02/27/23 0920 02/27/23 1210 02/27/23 1949 02/27/23 2227  TROPONINIHS 45* 50* 101* 91*      Chemistry Recent Labs  Lab 02/27/23 0920 02/27/23 1949 02/28/23 0306 03/01/23 0306 03/03/23 0422 03/04/23 0407 03/05/23 0736  NA 128*  --  132*   < > 133* 133* 132*  K 4.0  --  4.0   < > 3.9 3.9 4.6  CL 98  --  103   < > 99 100 100  CO2 20*  --  21*   < > 27 27 24   GLUCOSE 138*  --  105*   < > 121* 120* 128*  BUN 15  --  15   < > 19 15 13   CREATININE 0.83  --  0.94   < > 1.06 1.15 1.05  CALCIUM 8.4*  --  7.9*   < > 8.2* 8.3* 8.5*  MG  --    < >  --   --  2.0 2.1 2.1  PROT 6.2*  --  5.1*  --   --   --   --   ALBUMIN 3.2*  --  2.5*  --   --   --   --   AST 28  --  30  --   --   --   --   ALT 39  --  39  --   --   --   --   ALKPHOS 59  --  46  --   --   --   --   BILITOT 1.4*  --  1.0  --   --   --   --   GFRNONAA >60  --  >60   < > >60 >60 >60  ANIONGAP 10  --  8   < > 7 6 8    < > = values in this interval not displayed.     Lipids No results for input(s): "CHOL", "TRIG", "HDL", "LABVLDL", "LDLCALC", "CHOLHDL" in the last 168 hours.  Hematology Recent Labs  Lab 03/02/23 0310 03/02/23 0802 03/05/23 0736  WBC 8.7 10.0 8.5  RBC 3.13* 3.29* 3.27*  HGB 10.6* 11.0* 10.8*  HCT 29.9*  31.4* 30.9*  MCV 95.5 95.4 94.5  MCH 33.9 33.4 33.0  MCHC 35.5 35.0 35.0  RDW 11.7 11.6 11.8  PLT 153 175 199    Thyroid  Recent Labs  Lab 02/28/23 1002  TSH 1.442  FREET4 1.54*     BNP Recent Labs  Lab 02/27/23 0920  BNP 744.7*     DDimer No results for input(s): "DDIMER" in the last 168 hours.   Radiology    ECHO TEE  Result Date: 03/04/2023    TRANSESOPHOGEAL ECHO REPORT   Patient Name:   THINH CUCCARO Date of Exam: 03/04/2023 Medical Rec #:  [id number]        Height:       67.0 in Accession #:    [id number]       Weight:       160.7 lb Date of Birth:  November 14, 1945       BSA:          1.843 m Patient Age:    32 years         BP:           99/44 mmHg Patient Gender: M                HR:           66 bpm. Exam Location:  Inpatient Procedure: Transesophageal Echo, Cardiac Doppler, Color Doppler and 3D Echo Indications:     aortic regurgitation  History:         Patient has prior history of Echocardiogram examinations, most                  recent 02/28/2023. CHF, CAD, ascending aorta repair; Risk  Factors:Hypertension, Diabetes and Former Smoker.                  Aortic Valve: valve is present in the aortic position.                  Procedure Date: 2008.  Sonographer:     Delcie Roch RDCS Referring Phys:  [id number] Roe Rutherford [facility] Diagnosing Phys: Riley Lam MD PROCEDURE: After discussion of the risks and benefits of a TEE, an informed consent was obtained from the patient. The transesophogeal probe was passed without difficulty through the esophogus of the patient. Imaged were obtained with the patient in a left lateral decubitus position. Sedation performed by different physician. The patient was monitored while under deep sedation. Anesthestetic sedation was provided intravenously by Anesthesiology: 288mg  of Propofol, 100mg  of Lidocaine. The patient developed no complications during the procedure.  IMPRESSIONS  1. 21 mm Edwards 3000 TFX Perimount Magna  Ease is present. There is mild paravalvular leak along the intra-valvular fibrosa. There is severe aortic regurgitation that is very eccentric and appears to be related to prolapse. There is holodiastolic flow reversal by Spectral Doppler. The aortic valve has been repaired/replaced. Aortic valve regurgitation is severe. There is a valve present in the aortic position. Procedure Date: 2008. Echo findings are consistent with stenosis and regurgitation of the aortic prosthesis. Aortic valve mean gradient measures 22.0 mmHg. Aortic valve Vmax measures 2.99 m/s. Aortic valve acceleration time measures 104 msec.  2. There is an intra-atrial membrane that attaches on the mid inferior portion of the septum and terminates in the posterior lateral portion of the right atrium. It does not cause turbulent color Doppler flow. No left atrial/left atrial appendage thrombus was detected.  3. Left ventricular ejection fraction, by estimation, is 60 to 65%. The left ventricle has normal function.  4. Right ventricular systolic function is normal. The right ventricular size is moderately enlarged.  5. The mitral valve is degenerative. Mild to moderate mitral valve regurgitation. No evidence of mitral stenosis.  6. Tricuspid valve regurgitation is mild to moderate.  7. There is mild (Grade II) plaque involving the ascending aorta, aortic arch and descending aorta. FINDINGS  Left Ventricle: Left ventricular ejection fraction, by estimation, is 60 to 65%. The left ventricle has normal function. The left ventricular internal cavity size was normal in size. Right Ventricle: The right ventricular size is moderately enlarged. Right vetricular wall thickness was not well visualized. Right ventricular systolic function is normal. Left Atrium: There is an intra-atrial membrane that attaches on the mid inferior portion of the septum and terminates in the posterior lateral portion of the right atrium. It does not cause turbulent color Doppler  flow. Left atrial size was normal in size. No left atrial/left atrial appendage thrombus was detected. Right Atrium: Right atrial size was normal in size. Pericardium: Trivial pericardial effusion is present. The pericardial effusion is circumferential. Mitral Valve: The mitral valve is degenerative in appearance. Mild to moderate mitral valve regurgitation. No evidence of mitral valve stenosis. Tricuspid Valve: The tricuspid valve is normal in structure. Tricuspid valve regurgitation is mild to moderate. No evidence of tricuspid stenosis. Aortic Valve: 21 mm Edwards 3000 TFX Perimount Magna Ease is present. There is mild paravalvular leak along the intra-valvular fibrosa. There is severe aortic regurgitation that is very eccentric and appears to be related to prolapse. There is holodiastolic flow reversal by Spectral Doppler. The aortic valve has been repaired/replaced. Aortic valve regurgitation is severe. Aortic valve mean  gradient measures 22.0 mmHg. Aortic valve peak gradient measures 35.8 mmHg. Aortic valve area, by VTI measures 0.94 cm. There is a valve present in the aortic position. Procedure Date: 2008. Pulmonic Valve: The pulmonic valve was grossly normal. Pulmonic valve regurgitation is not visualized. Aorta: The aortic root, ascending aorta, aortic arch and descending aorta are all structurally normal, with no evidence of dilitation or obstruction. There is mild (Grade II) plaque involving the ascending aorta, aortic arch and descending aorta. IAS/Shunts: No atrial level shunt detected by color flow Doppler.  LEFT VENTRICLE PLAX 2D LVOT diam:     1.70 cm LV SV:         70 LV SV Index:   38 LVOT Area:     2.27 cm  AORTIC VALVE AV Area (Vmax):    1.00 cm AV Area (Vmean):   0.89 cm AV Area (VTI):     0.94 cm AV Vmax:           299.00 cm/s AV Vmean:          218.000 cm/s AV VTI:            0.746 m AV Peak Grad:      35.8 mmHg AV Mean Grad:      22.0 mmHg LVOT Vmax:         132.00 cm/s LVOT Vmean:         85.100 cm/s LVOT VTI:          0.308 m LVOT/AV VTI ratio: 0.41  AORTA Ao Asc diam: 2.30 cm  SHUNTS Systemic VTI:  0.31 m Systemic Diam: 1.70 cm Riley Lam MD Electronically signed by Riley Lam MD Signature Date/Time: 03/04/2023/5:50:42 PM    Final    EP STUDY  Result Date: 03/04/2023 See surgical note for result.  DG Orthopantogram  Result Date: 03/03/2023 CLINICAL DATA:  Poor dentition. EXAM: ORTHOPANTOGRAM/PANORAMIC COMPARISON:  None Available. FINDINGS: Very poor dentition is noted with many missing teeth. No definite fracture or lytic lesion is seen involving the mandible. At least 1 tooth in the left mandible has significant erosion of its crown. IMPRESSION: No definite fracture or lytic lesion is seen involving the mandible. Electronically Signed   By: Lupita Raider M.D.   On: 03/03/2023 15:41   CT CORONARY MORPH W/CTA COR W/SCORE W/CA W/CM &/OR WO/CM  Addendum Date: 03/03/2023   ADDENDUM REPORT: 03/03/2023 12:15 CLINICAL DATA:  Pre Valve in Valve TAVR EXAM: Cardiac TAVR CT TECHNIQUE: The patient was scanned on a Siemens Force 192 slice scanner. A 120 kV retrospective scan was triggered in the descending thoracic aorta at 111 HU's. Gantry rotation speed was 270 msecs and collimation was .9 mm. No beta blockade or nitro were given. The 3D data set was reconstructed in 5% intervals of the R-R cycle. Systolic and diastolic phases were analyzed on a dedicated work station using MPR, MIP and VRT modes. The patient received 80 cc of contrast. FINDINGS: Aortic Valve: Has been replaced in 2008. 21 mm Edwards 3000 TFX Perimount Magna stented bioprosthetic valve The stent ID is 20 mm the valve height is 15 mm And the True ID is 19 mm The leaflets are thickened with mild calcification there is some prolapse of the non coronary cusp. There is a possible area of annular dehiscence near the left cusp measuring 4.6 mm x 3.7 mm with area of 0.15 cm2. Inferior to this in the LVOT is a large  area of calcium at the base of the anterior  mitral leaflet and intervalvular fibrosa. Aorta: There appears to be a straight graft repair of ascending thoracic aorta above the AVR Suture lines intact with no aneurysm Sinotubular Junction: 28 mm Ascending Thoracic Aorta: 29.5 mm Aortic Arch: 31 nn Descending Thoracic Aorta: Very tortuous 25 mm Sinus of Valsalva Measurements: Non-coronary: 33.3 mm Right - coronary: 30.6 mm Left - coronary: 32 mm Coronary Artery Height above Annulus: Left Main: 20.3 mm above annulus Right Coronary: 17.5 mm above annulus Coronary Arteries: Sufficient height above annulus for deployment The coronary arteries arise aboe the embedded geometry of the valve and do Not appear to be at risk for occlusion Optimum Fluoroscopic Angle for Delivery: RAO 7 Caudal 13 degrees Without balloon fracture a 20 mm Sapien 3 valve appears to fit the ID of the valve better If the valve is dilated with a 22 balloon possible use of 23 Sapien valve IMPRESSION: 1. 77 yo Edwards 3000 TFX Perimount Magna Valve with thickened leaflets and mild calcium. 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 Coronary arteries quite high and not at risk for occlusion with valve in valve procedure being above simulated valve implant with Sapien 3. Valve in Valve app suggests using 20 mm Sapien vs fracture with 22 balloon and using a 23 mm Sapien. Embedded geometry confirms that to fit a 23 mm valve the annulus would need balloon fracture 4. 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 Optimum angiographic angle for deployment RAO 7 Caudal 13 degrees with aortic root contrast injection to try and visualize hinge points for leaflets Charlton Haws Electronically Signed    By: Charlton Haws M.D.   On: 03/03/2023 12:15   Result Date: 03/03/2023 EXAM: OVER-READ INTERPRETATION  CT CHEST The following report is a limited chest CT over-read performed by radiologist Dr. Allegra Lai of [facility] on 03/03/2023. This over-read does not include interpretation of cardiac or coronary anatomy or pathology. The cardiac TAVR interpretation by the cardiologist is attached. COMPARISON:  None Available. FINDINGS: Extracardiac findings will be described separately under dictation for contemporaneously obtained CTA chest, abdomen and pelvis. IMPRESSION: Please see separate dictation for contemporaneously obtained CTA chest, abdomen and pelvis dated 03/03/2023 for full description of relevant extracardiac findings. Electronically Signed: By: Allegra Lai M.D. On: 03/03/2023 11:22   CT ANGIO CHEST AORTA W/CM & OR WO/CM  Result Date: 03/03/2023 CLINICAL DATA:  Aortic valve replacement preop evaluation EXAM: CT ANGIOGRAPHY CHEST, ABDOMEN AND PELVIS TECHNIQUE: Non-contrast CT of the chest was initially obtained. Multidetector CT imaging through the chest, abdomen and pelvis was performed using the standard protocol during bolus administration of intravenous contrast. Multiplanar reconstructed images and MIPs were obtained and reviewed to evaluate the vascular anatomy. RADIATION DOSE REDUCTION: This exam was performed according to the departmental dose-optimization program which includes automated exposure control, adjustment of the mA and/or kV according to patient size and/or use of iterative reconstruction technique. CONTRAST:  OMNIPAQUE IOHEXOL 350 MG/ML SOLN COMPARISON:  Chest CTA dated February 27, 2023 FINDINGS: CTA CHEST FINDINGS Cardiovascular: Cardiomegaly. No pericardial effusion. 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 Three-vessel coronary artery calcifications. Mediastinum/Nodes: Esophagus and thyroid are unremarkable.  Mildly enlarged mediastinal lymph nodes which are likely  reactive. Reference subcarinal lymph node measuring 1.3 cm in short axis on series 3, image 94. Lungs/Pleura: Central airways are patent. Central predominant ground-glass opacities, decreased when compared with the prior exam. Moderate bilateral pleural effusions atelectasis. No pneumothorax. Small solid subpleural nodule of the left upper lobe measuring 4 mm on series 7, image 53. Musculoskeletal: Prior median sternotomy. Mild no chest wall abnormality. No acute or significant osseous findings. CTA ABDOMEN AND PELVIS FINDINGS Hepatobiliary: No focal liver abnormality is seen. No gallstones, gallbladder wall thickening, or biliary dilatation. Pancreas: Unremarkable. No pancreatic ductal dilatation or surrounding inflammatory changes. Spleen: Normal in size without focal abnormality. Adrenals/Urinary Tract: Bilateral adrenal glands are unremarkable. No hydronephrosis or nephrolithiasis. Bladder is unremarkable. Stomach/Bowel: Stomach is within normal limits. No evidence of bowel wall thickening, distention, or inflammatory changes. Vascular/lymphatic: Normal caliber abdominal aorta with severe atherosclerotic disease including areas of ulcerated soft plaque. 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 Other major branch vessels of the abdominal aorta are patent. No pathologically enlarged lymph nodes seen in the chest. Reproductive: Prostate is unremarkable. Other: No abdominal wall hernia or abnormality. No abdominopelvic ascites. Musculoskeletal: No acute or significant osseous findings. VASCULAR MEASUREMENTS PERTINENT TO TAVR: AORTA: Minimal Aortic Diameter-13.7 mm Severity of Aortic Calcification-moderate RIGHT PELVIS: Right Common Iliac Artery - Minimal Diameter-7.9 mm Tortuosity-severe Calcification-moderate Right External Iliac Artery - Minimal Diameter-8.6 mm Tortuosity-mild Calcification-none Right Common  Femoral Artery - Minimal Diameter-7.7 mm Tortuosity-none Calcification-moderate LEFT PELVIS: Left Common Iliac Artery - Minimal Diameter-6.9 mm Tortuosity-mild Calcification-moderate Left External Iliac Artery - Minimal Diameter-8.3 mm Tortuosity-mild Calcification-none Left Common Femoral Artery - Minimal Diameter-7.3 mm Tortuosity-none Calcification-moderate Review of the MIP images confirms the above findings. IMPRESSION: Vascular: 1. Vascular findings and measurements pertinent to potential TAVR procedure, as detailed above. 2. 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 3. 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 4. Three-vessel coronary artery disease. Nonvascular: 1. Pulmonary edema, Improved when compared with the prior exam. 2. Moderate bilateral pleural effusions and bibasilar atelectasis, similar to prior. 3. Small solid subpleural nodule of the left upper lobe measuring 4 mm. No follow-up needed if patient is low-risk.This recommendation follows the consensus statement: Guidelines for Management of Incidental Pulmonary Nodules Detected on CT Images: From the Fleischner Society 2017; Radiology 2017; 284:228-243. Electronically Signed   By: Allegra Lai M.D.   On: 03/03/2023 11:16   CT Angio Abd/Pel w/ and/or w/o  Result Date: 03/03/2023 CLINICAL DATA:  Aortic valve replacement preop evaluation EXAM: CT ANGIOGRAPHY CHEST, ABDOMEN AND PELVIS TECHNIQUE: Non-contrast CT of the chest was initially obtained. Multidetector CT imaging through the chest, abdomen and pelvis was performed using the standard protocol during bolus administration of intravenous contrast. Multiplanar reconstructed images and MIPs were obtained and reviewed to evaluate the vascular anatomy. RADIATION DOSE REDUCTION: This exam was performed according to the departmental dose-optimization  program which includes automated exposure control, adjustment of the mA and/or kV according to patient size and/or use of iterative reconstruction technique. CONTRAST:  OMNIPAQUE IOHEXOL 350 MG/ML SOLN COMPARISON:  Chest CTA dated February 27, 2023 FINDINGS: CTA CHEST FINDINGS Cardiovascular: Cardiomegaly. No pericardial effusion. 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 Three-vessel coronary artery calcifications. Mediastinum/Nodes: Esophagus and thyroid are unremarkable. Mildly enlarged mediastinal lymph nodes which are likely reactive. Reference subcarinal lymph node measuring 1.3 cm in short axis on  series 3, image 94. Lungs/Pleura: Central airways are patent. Central predominant ground-glass opacities, decreased when compared with the prior exam. Moderate bilateral pleural effusions atelectasis. No pneumothorax. Small solid subpleural nodule of the left upper lobe measuring 4 mm on series 7, image 53. Musculoskeletal: Prior median sternotomy. Mild no chest wall abnormality. No acute or significant osseous findings. CTA ABDOMEN AND PELVIS FINDINGS Hepatobiliary: No focal liver abnormality is seen. No gallstones, gallbladder wall thickening, or biliary dilatation. Pancreas: Unremarkable. No pancreatic ductal dilatation or surrounding inflammatory changes. Spleen: Normal in size without focal abnormality. Adrenals/Urinary Tract: Bilateral adrenal glands are unremarkable. No hydronephrosis or nephrolithiasis. Bladder is unremarkable. Stomach/Bowel: Stomach is within normal limits. No evidence of bowel wall thickening, distention, or inflammatory changes. Vascular/lymphatic: Normal caliber abdominal aorta with severe atherosclerotic disease including areas of ulcerated soft plaque. 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 Other major branch vessels of the abdominal aorta are patent. No pathologically enlarged lymph nodes seen  in the chest. Reproductive: Prostate is unremarkable. Other: No abdominal wall hernia or abnormality. No abdominopelvic ascites. Musculoskeletal: No acute or significant osseous findings. VASCULAR MEASUREMENTS PERTINENT TO TAVR: AORTA: Minimal Aortic Diameter-13.7 mm Severity of Aortic Calcification-moderate RIGHT PELVIS: Right Common Iliac Artery - Minimal Diameter-7.9 mm Tortuosity-severe Calcification-moderate Right External Iliac Artery - Minimal Diameter-8.6 mm Tortuosity-mild Calcification-none Right Common Femoral Artery - Minimal Diameter-7.7 mm Tortuosity-none Calcification-moderate LEFT PELVIS: Left Common Iliac Artery - Minimal Diameter-6.9 mm Tortuosity-mild Calcification-moderate Left External Iliac Artery - Minimal Diameter-8.3 mm Tortuosity-mild Calcification-none Left Common Femoral Artery - Minimal Diameter-7.3 mm Tortuosity-none Calcification-moderate Review of the MIP images confirms the above findings. IMPRESSION: Vascular: 1. Vascular findings and measurements pertinent to potential TAVR procedure, as detailed above. 2. 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 3. 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 4. Three-vessel coronary artery disease. Nonvascular: 1. Pulmonary edema, Improved when compared with the prior exam. 2. Moderate bilateral pleural effusions and bibasilar atelectasis, similar to prior. 3. Small solid subpleural nodule of the left upper lobe measuring 4 mm. No follow-up needed if patient is low-risk.This recommendation follows the consensus statement: Guidelines for Management of Incidental Pulmonary Nodules Detected on CT Images: From the Fleischner Society 2017; Radiology 2017; 284:228-243. Electronically Signed   By: Allegra Lai M.D.   On: 03/03/2023 11:16    Cardiac Studies   ECHO 02/28/23 IMPRESSIONS  1. Left ventricular  ejection fraction, by estimation, is 60 to 65%. The  left ventricle has normal function. The left ventricle has no regional  wall motion abnormalities. Left ventricular diastolic parameters are  indeterminate.   2. Right ventricular systolic function is normal. The right ventricular  size is mildly enlarged.   3. Left atrial size was mild to moderately dilated.   4. Right atrial size was moderately dilated.   5. The mitral valve is degenerative. Mild mitral valve regurgitation. No  evidence of mitral stenosis. Moderate mitral annular calcification.   6. Tricuspid valve regurgitation is mild to moderate.   7. The aortic valve has been repaired/replaced. Aortic valve  regurgitation is severe. Mild aortic valve stenosis. There is a 21 mm  Magna pericardial valve present in the aortic position. Procedure Date:  11/2006. Aortic regurgitation PHT measures 147  msec. Aortic valve area, by VTI measures 1.46 cm. Aortic valve mean  gradient measures 20.0 mmHg. Aortic valve Vmax measures 3.08 m/s.   8.  The inferior vena cava is dilated in size with <50% respiratory  variability, suggesting right atrial pressure of 15 mmHg.   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 Suggest TEE to further evalaute.    Assessment & Plan    AS/AI s/p bioprosthetic valve 2008    Fever   CAD   Hypertension   Hyperlipidemia with LDL goal < 70   Blood cultures were negative.  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 Jari Pigg help) plan is for review at valve conference on Tuesday.  Patient is eager to have his valve fixed.  Has dental implants that he will be pursuing this week in anticipation For questions or updates, please contact [facility] Please consult [email or web address] for contact info under        Signed, Sherryl Manges, MD  03/05/2023, 9:48 AM

## 2023-03-05 NOTE — Discharge Summary (Signed)
PATIENT DETAILS Name: Randy Duncan Age: 77 y.o. Sex: male Date of Birth: [birth year] MRN: [id number]. Admitting Physician: Dimple Nanas, MD ONG:[id number], Onalee Hua, MD  Admit Date: 02/27/2023 Discharge date: 03/05/2023  Recommendations for Outpatient Follow-up:  Follow up with PCP in 1-2 weeks Please obtain CMP/CBC in one week Small left lung nodule-see below-defer further to PCP.  Admitted From:  Home  Disposition: Home with home health   Discharge Condition: good  CODE STATUS:   Code Status: Full Code   Diet recommendation:  Diet Order             Diet - low sodium heart healthy           Diet gluten free Room service appropriate? Yes; Fluid consistency: Thin; Fluid restriction: 1200 mL Fluid  Diet effective now                    Brief Summary: Patient is a 77 y.o.  male with history of bioprosthetic aortic valve replacement in 2008, HTN, CAD-presenting with shortness of breath-found to have acute hypoxic respiratory failure due to acute on chronic HFpEF in the setting of severe aortic regurgitation   Significant events: 6/16>> admit to [facility] 6/19>> transferred to [facility] for structural cardiology evaluation/TEE/workup   Significant studies: 616>> CXR: CHF 6/16>> CTA chest: No PE-pulm edema 6/17>> echo: EF 60-65%, severe AR   Significant microbiology data: 6/16>>/influenza/RSV PCR: Negative 6/16>> respiratory virus panel: Negative 6/16>> blood culture: No growth   Procedures: 6/21>> TEE: Severe prosthetic aortic valve regurgitation.   Consults: Cardiology  Brief Hospital Course: Acute hypoxic respiratory failure due to HFpEF exacerbation Euvolemic-now on oral Lasix  Remains on room air.   Severe aortic regurgitation History of bioprosthetic aortic valve replacement 2008  TEE scheduled later today Cardiology and structural heart team following with plans for outpatient TAVR in the next 2-4 weeks.  Cardiology team will  arrange for follow-up appointment.   CAD No anginal symptoms Aspirin/statin/metoprolol   Hyponatremia In the setting of CHF Improved-mild-follow electrolytes   HTN BP stable on Toprol/hydralazine/Imdur   Normocytic anemia Likely due to acute illness Follow CBC   OSA Initially was on BiPAP but has not been using BiPAP for the past several nights Follow-up with PCP.   Small solid subpleural nodule left upper lobe 4 mm Stable for outpatient follow-up by PCP  Pressure Ulcer: Pressure Injury 02/27/23 Sacrum Stage 1 -  Intact skin with non-blanchable redness of a localized area usually over a bony prominence. (Active)  02/27/23 1739  Location: Sacrum  Location Orientation:   Staging: Stage 1 -  Intact skin with non-blanchable redness of a localized area usually over a bony prominence.  Wound Description (Comments):   Present on Admission: Yes  Dressing Type Foam - Lift dressing to assess site every shift 03/05/23 0800   BMI: Estimated body mass index is 25.21 kg/m as calculated from the following:   Height as of this encounter: 5\' 7"  (1.702 m).   Weight as of this encounter: 73 kg.   Discharge Diagnoses:  Principal Problem:   Acute respiratory failure with hypoxia (HCC) Active Problems:   Hypersomnia with sleep apnea   Coronary artery disease involving native coronary artery of native heart without angina pectoris   Essential hypertension   Pressure injury of skin   Normocytic anemia   Thrombocytopenia (HCC)   Hyponatremia   Hyperbilirubinemia   Mild protein malnutrition (HCC)   Elevated troponin   Pulmonary edema  Acute on chronic combined systolic and diastolic CHF (congestive heart failure) (HCC)   S/P AVR   Precordial pain   Severe aortic regurgitation   Discharge Instructions:  Activity:  As tolerated with Full fall precautions use walker/cane & assistance as needed   Discharge Instructions     (HEART FAILURE PATIENTS) Call MD:  Anytime you have  any of the following symptoms: 1) 3 pound weight gain in 24 hours or 5 pounds in 1 week 2) shortness of breath, with or without a dry hacking cough 3) swelling in the hands, feet or stomach 4) if you have to sleep on extra pillows at night in order to breathe.   Complete by: As directed    Diet - low sodium heart healthy   Complete by: As directed    Discharge instructions   Complete by: As directed    Follow with Primary MD  Tracey Harries, MD in 1-2 weeks  Follow-up with cardiology-the office will call you with a follow-up appointment  You were incidentally found to have a very small left 4 mm lung nodule-please let your primary care practitioner know about this finding.  You may need repeat CT scan in the next several months depending on your primary care practitioner evaluation.  Please get a complete blood count and chemistry panel checked by your Primary MD at your next visit, and again as instructed by your Primary MD.  Get Medicines reviewed and adjusted: Please take all your medications with you for your next visit with your Primary MD  Laboratory/radiological data: Please request your Primary MD to go over all hospital tests and procedure/radiological results at the follow up, please ask your Primary MD to get all Hospital records sent to his/her office.  In some cases, they will be blood work, cultures and biopsy results pending at the time of your discharge. Please request that your primary care M.D. follows up on these results.  Also Note the following: If you experience worsening of your admission symptoms, develop shortness of breath, life threatening emergency, suicidal or homicidal thoughts you must seek medical attention immediately by calling 911 or calling your MD immediately  if symptoms less severe.  You must read complete instructions/literature along with all the possible adverse reactions/side effects for all the Medicines you take and that have been prescribed to  you. Take any new Medicines after you have completely understood and accpet all the possible adverse reactions/side effects.   Do not drive when taking Pain medications or sleeping medications (Benzodaizepines)  Do not take more than prescribed Pain, Sleep and Anxiety Medications. It is not advisable to combine anxiety,sleep and pain medications without talking with your primary care practitioner  Special Instructions: If you have smoked or chewed Tobacco  in the last 2 yrs please stop smoking, stop any regular Alcohol  and or any Recreational drug use.  Wear Seat belts while driving.  Please note: You were cared for by a hospitalist during your hospital stay. Once you are discharged, your primary care physician will handle any further medical issues. Please note that NO REFILLS for any discharge medications will be authorized once you are discharged, as it is imperative that you return to your primary care physician (or establish a relationship with a primary care physician if you do not have one) for your post hospital discharge needs so that they can reassess your need for medications and monitor your lab values.   Increase activity slowly   Complete by: As directed  No wound care   Complete by: As directed       Allergies as of 03/05/2023       Reactions   Losartan Potassium Itching, Swelling   Wheat Rash, Other (See Comments)   Celiac disease   Naproxen Other (See Comments)   Sweats and stomach upset    Amlodipine    Coreg [carvedilol]    Hydralazine Other (See Comments)   Cannot tolerate this at 3 times a day    Lisinopril    Bupropion Nausea Only, Other (See Comments)   Upset stomach   Cephalexin Nausea Only, Other (See Comments)   upset stomach        Medication List     TAKE these medications    aspirin EC 81 MG tablet Take 81 mg by mouth in the morning. Swallow whole.   atorvastatin 80 MG tablet Commonly known as: LIPITOR Take 80 mg by mouth at bedtime.    clonazePAM 0.5 MG tablet Commonly known as: KLONOPIN Take 0.25 mg by mouth every 4 (four) hours.   furosemide 40 MG tablet Commonly known as: LASIX Take 1 tablet (40 mg total) by mouth daily. Start taking on: March 06, 2023   guaiFENesin-dextromethorphan 100-10 MG/5ML syrup Commonly known as: ROBITUSSIN DM Take 5 mLs by mouth every 4 (four) hours as needed for cough.   hydrALAZINE 25 MG tablet Commonly known as: APRESOLINE Take 25 mg by mouth in the morning and at bedtime.   isosorbide mononitrate 30 MG 24 hr tablet Commonly known as: IMDUR Take 0.5 tablets (15 mg total) by mouth daily. Start taking on: March 06, 2023   metoprolol succinate 25 MG 24 hr tablet Commonly known as: TOPROL-XL Take 12.5 mg by mouth at bedtime.   ranolazine 1000 MG SR tablet Commonly known as: RANEXA Take 1,000 mg by mouth in the morning and at bedtime.   Vitamin D3 125 MCG (5000 UT) Caps Take 5,000 Units by mouth in the morning.   ZyrTEC Allergy 10 MG tablet Generic drug: cetirizine Take 10 mg by mouth See admin instructions. Take 10 mg by mouth in the evening-bedtime, along with the 2nd daily dose of Hydralazine        Follow-up Information     [facility], [facility] Follow up.   Specialty: Home Health Services Why: [facility] will provide home health physical therapy and will contact you within 48 hours of discharge Contact information: [address] [place] [address] [phone]         Tracey Harries, MD. Schedule an appointment as soon as possible for a visit in 1 week(s).   Specialty: Family Medicine Contact information: [address] [place] [address] [phone]         [facility] Office Follow up.   Specialty: Cardiology Why: Office will call with date/time, If you dont hear from them,please give them a call Contact information: [address], [address] [place] [address] [phone]                Allergies  Allergen Reactions   Losartan Potassium Itching and Swelling   Wheat Rash and Other (See Comments)    Celiac disease   Naproxen Other (See Comments)    Sweats and stomach upset     Amlodipine    Coreg [Carvedilol]    Hydralazine Other (See Comments)    Cannot tolerate this at 3 times a day    Lisinopril  Bupropion Nausea Only and Other (See Comments)    Upset stomach   Cephalexin Nausea Only and Other (See Comments)    upset stomach     Other Procedures/Studies: ECHO TEE  Result Date: 03/04/2023    TRANSESOPHOGEAL ECHO REPORT   Patient Name:   Randy Duncan Date of Exam: 03/04/2023 Medical Rec #:  [id number]        Height:       67.0 in Accession #:    [id number]       Weight:       160.7 lb Date of Birth:  02-02-1946       BSA:          1.843 m Patient Age:    30 years         BP:           99/44 mmHg Patient Gender: M                HR:           66 bpm. Exam Location:  Inpatient Procedure: Transesophageal Echo, Cardiac Doppler, Color Doppler and 3D Echo Indications:     aortic regurgitation  History:         Patient has prior history of Echocardiogram examinations, most                  recent 02/28/2023. CHF, CAD, ascending aorta repair; Risk                  Factors:Hypertension, Diabetes and Former Smoker.                  Aortic Valve: valve is present in the aortic position.                  Procedure Date: 2008.  Sonographer:     Delcie Roch RDCS Referring Phys:  [id number] Roe Rutherford [facility] Diagnosing Phys: Riley Lam MD PROCEDURE: After discussion of the risks and benefits of a TEE, an informed consent was obtained from the patient. The transesophogeal probe was passed without difficulty through the esophogus of the patient. Imaged were obtained with the patient in a left lateral decubitus position. Sedation performed by different physician. The patient was monitored while under deep sedation. Anesthestetic sedation was provided  intravenously by Anesthesiology: 288mg  of Propofol, 100mg  of Lidocaine. The patient developed no complications during the procedure.  IMPRESSIONS  1. 21 mm Edwards 3000 TFX Perimount Magna Ease is present. There is mild paravalvular leak along the intra-valvular fibrosa. There is severe aortic regurgitation that is very eccentric and appears to be related to prolapse. There is holodiastolic flow reversal by Spectral Doppler. The aortic valve has been repaired/replaced. Aortic valve regurgitation is severe. There is a valve present in the aortic position. Procedure Date: 2008. Echo findings are consistent with stenosis and regurgitation of the aortic prosthesis. Aortic valve mean gradient measures 22.0 mmHg. Aortic valve Vmax measures 2.99 m/s. Aortic valve acceleration time measures 104 msec.  2. There is an intra-atrial membrane that attaches on the mid inferior portion of the septum and terminates in the posterior lateral portion of the right atrium. It does not cause turbulent color Doppler flow. No left atrial/left atrial appendage thrombus was detected.  3. Left ventricular ejection fraction, by estimation, is 60 to 65%. The left ventricle has normal function.  4. Right ventricular systolic function is normal. The right ventricular size is moderately enlarged.  5. The mitral valve is degenerative. Mild  to moderate mitral valve regurgitation. No evidence of mitral stenosis.  6. Tricuspid valve regurgitation is mild to moderate.  7. There is mild (Grade II) plaque involving the ascending aorta, aortic arch and descending aorta. FINDINGS  Left Ventricle: Left ventricular ejection fraction, by estimation, is 60 to 65%. The left ventricle has normal function. The left ventricular internal cavity size was normal in size. Right Ventricle: The right ventricular size is moderately enlarged. Right vetricular wall thickness was not well visualized. Right ventricular systolic function is normal. Left Atrium: There is an  intra-atrial membrane that attaches on the mid inferior portion of the septum and terminates in the posterior lateral portion of the right atrium. It does not cause turbulent color Doppler flow. Left atrial size was normal in size. No left atrial/left atrial appendage thrombus was detected. Right Atrium: Right atrial size was normal in size. Pericardium: Trivial pericardial effusion is present. The pericardial effusion is circumferential. Mitral Valve: The mitral valve is degenerative in appearance. Mild to moderate mitral valve regurgitation. No evidence of mitral valve stenosis. Tricuspid Valve: The tricuspid valve is normal in structure. Tricuspid valve regurgitation is mild to moderate. No evidence of tricuspid stenosis. Aortic Valve: 21 mm Edwards 3000 TFX Perimount Magna Ease is present. There is mild paravalvular leak along the intra-valvular fibrosa. There is severe aortic regurgitation that is very eccentric and appears to be related to prolapse. There is holodiastolic flow reversal by Spectral Doppler. The aortic valve has been repaired/replaced. Aortic valve regurgitation is severe. Aortic valve mean gradient measures 22.0 mmHg. Aortic valve peak gradient measures 35.8 mmHg. Aortic valve area, by VTI measures 0.94 cm. There is a valve present in the aortic position. Procedure Date: 2008. Pulmonic Valve: The pulmonic valve was grossly normal. Pulmonic valve regurgitation is not visualized. Aorta: The aortic root, ascending aorta, aortic arch and descending aorta are all structurally normal, with no evidence of dilitation or obstruction. There is mild (Grade II) plaque involving the ascending aorta, aortic arch and descending aorta. IAS/Shunts: No atrial level shunt detected by color flow Doppler.  LEFT VENTRICLE PLAX 2D LVOT diam:     1.70 cm LV SV:         70 LV SV Index:   38 LVOT Area:     2.27 cm  AORTIC VALVE AV Area (Vmax):    1.00 cm AV Area (Vmean):   0.89 cm AV Area (VTI):     0.94 cm AV  Vmax:           299.00 cm/s AV Vmean:          218.000 cm/s AV VTI:            0.746 m AV Peak Grad:      35.8 mmHg AV Mean Grad:      22.0 mmHg LVOT Vmax:         132.00 cm/s LVOT Vmean:        85.100 cm/s LVOT VTI:          0.308 m LVOT/AV VTI ratio: 0.41  AORTA Ao Asc diam: 2.30 cm  SHUNTS Systemic VTI:  0.31 m Systemic Diam: 1.70 cm Riley Lam MD Electronically signed by Riley Lam MD Signature Date/Time: 03/04/2023/5:50:42 PM    Final    EP STUDY  Result Date: 03/04/2023 See surgical note for result.  DG Orthopantogram  Result Date: 03/03/2023 CLINICAL DATA:  Poor dentition. EXAM: ORTHOPANTOGRAM/PANORAMIC COMPARISON:  None Available. FINDINGS: Very poor dentition is noted with many missing teeth. No  definite fracture or lytic lesion is seen involving the mandible. At least 1 tooth in the left mandible has significant erosion of its crown. IMPRESSION: No definite fracture or lytic lesion is seen involving the mandible. Electronically Signed   By: Lupita Raider M.D.   On: 03/03/2023 15:41   CT CORONARY MORPH W/CTA COR W/SCORE W/CA W/CM &/OR WO/CM  Addendum Date: 03/03/2023   ADDENDUM REPORT: 03/03/2023 12:15 CLINICAL DATA:  Pre Valve in Valve TAVR EXAM: Cardiac TAVR CT TECHNIQUE: The patient was scanned on a Siemens Force 192 slice scanner. A 120 kV retrospective scan was triggered in the descending thoracic aorta at 111 HU's. Gantry rotation speed was 270 msecs and collimation was .9 mm. No beta blockade or nitro were given. The 3D data set was reconstructed in 5% intervals of the R-R cycle. Systolic and diastolic phases were analyzed on a dedicated work station using MPR, MIP and VRT modes. The patient received 80 cc of contrast. FINDINGS: Aortic Valve: Has been replaced in 2008. 21 mm Edwards 3000 TFX Perimount Magna stented bioprosthetic valve The stent ID is 20 mm the valve height is 15 mm And the True ID is 19 mm The leaflets are thickened with mild calcification there is  some prolapse of the non coronary cusp. There is a possible area of annular dehiscence near the left cusp measuring 4.6 mm x 3.7 mm with area of 0.15 cm2. Inferior to this in the LVOT is a large area of calcium at the base of the anterior mitral leaflet and intervalvular fibrosa. Aorta: There appears to be a straight graft repair of ascending thoracic aorta above the AVR Suture lines intact with no aneurysm Sinotubular Junction: 28 mm Ascending Thoracic Aorta: 29.5 mm Aortic Arch: 31 nn Descending Thoracic Aorta: Very tortuous 25 mm Sinus of Valsalva Measurements: Non-coronary: 33.3 mm Right - coronary: 30.6 mm Left - coronary: 32 mm Coronary Artery Height above Annulus: Left Main: 20.3 mm above annulus Right Coronary: 17.5 mm above annulus Coronary Arteries: Sufficient height above annulus for deployment The coronary arteries arise aboe the embedded geometry of the valve and do Not appear to be at risk for occlusion Optimum Fluoroscopic Angle for Delivery: RAO 7 Caudal 13 degrees Without balloon fracture a 20 mm Sapien 3 valve appears to fit the ID of the valve better If the valve is dilated with a 22 balloon possible use of 23 Sapien valve IMPRESSION: 1. 77 yo Edwards 3000 TFX Perimount Magna Valve with thickened leaflets and mild calcium. 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 Coronary arteries quite high and not at risk for occlusion with valve in valve procedure being above simulated valve implant with Sapien 3. Valve in Valve app suggests using 20 mm Sapien vs fracture with 22 balloon and using a 23 mm Sapien. Embedded geometry confirms that to fit a 23 mm valve the annulus would need balloon fracture 4. Intact straight graft repair of the ascending thoracic aorta with no residual  aneurysm 5. Optimum angiographic angle for deployment RAO 7 Caudal 13 degrees with aortic root contrast injection to try and visualize hinge points for leaflets Charlton Haws Electronically Signed   By: Charlton Haws M.D.   On: 03/03/2023 12:15   Result Date: 03/03/2023 EXAM: OVER-READ INTERPRETATION  CT CHEST The following report is a limited chest CT over-read performed by radiologist Dr. Allegra Lai of [facility] on 03/03/2023. This over-read does not include interpretation of cardiac or coronary anatomy or pathology. The cardiac TAVR interpretation by the cardiologist is attached. COMPARISON:  None Available. FINDINGS: Extracardiac findings will be described separately under dictation for contemporaneously obtained CTA chest, abdomen and pelvis. IMPRESSION: Please see separate dictation for contemporaneously obtained CTA chest, abdomen and pelvis dated 03/03/2023 for full description of relevant extracardiac findings. Electronically Signed: By: Allegra Lai M.D. On: 03/03/2023 11:22   CT ANGIO CHEST AORTA W/CM & OR WO/CM  Result Date: 03/03/2023 CLINICAL DATA:  Aortic valve replacement preop evaluation EXAM: CT ANGIOGRAPHY CHEST, ABDOMEN AND PELVIS TECHNIQUE: Non-contrast CT of the chest was initially obtained. Multidetector CT imaging through the chest, abdomen and pelvis was performed using the standard protocol during bolus administration of intravenous contrast. Multiplanar reconstructed images and MIPs were obtained and reviewed to evaluate the vascular anatomy. RADIATION DOSE REDUCTION: This exam was performed according to the departmental dose-optimization program which includes automated exposure control, adjustment of the mA and/or kV according to patient size and/or use of iterative reconstruction technique. CONTRAST:  OMNIPAQUE IOHEXOL 350 MG/ML SOLN COMPARISON:  Chest CTA dated February 27, 2023 FINDINGS: CTA CHEST FINDINGS Cardiovascular: Cardiomegaly. No pericardial  effusion. 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 Three-vessel coronary artery calcifications. Mediastinum/Nodes: Esophagus and thyroid are unremarkable. Mildly enlarged mediastinal lymph nodes which are likely reactive. Reference subcarinal lymph node measuring 1.3 cm in short axis on series 3, image 94. Lungs/Pleura: Central airways are patent. Central predominant ground-glass opacities, decreased when compared with the prior exam. Moderate bilateral pleural effusions atelectasis. No pneumothorax. Small solid subpleural nodule of the left upper lobe measuring 4 mm on series 7, image 53. Musculoskeletal: Prior median sternotomy. Mild no chest wall abnormality. No acute or significant osseous findings. CTA ABDOMEN AND PELVIS FINDINGS Hepatobiliary: No focal liver abnormality is seen. No gallstones, gallbladder wall thickening, or biliary dilatation. Pancreas: Unremarkable. No pancreatic ductal dilatation or surrounding inflammatory changes. Spleen: Normal in size without focal abnormality. Adrenals/Urinary Tract: Bilateral adrenal glands are unremarkable. No hydronephrosis or nephrolithiasis. Bladder is unremarkable. Stomach/Bowel: Stomach is within normal limits. No evidence of bowel wall thickening, distention, or inflammatory changes. Vascular/lymphatic: Normal caliber abdominal aorta with severe atherosclerotic disease including areas of ulcerated soft plaque. 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 Other major branch vessels of the abdominal aorta are patent. No pathologically enlarged lymph nodes seen in the chest. Reproductive: Prostate is unremarkable. Other: No abdominal wall hernia or abnormality. No abdominopelvic ascites. Musculoskeletal: No acute or significant osseous findings. VASCULAR MEASUREMENTS PERTINENT TO TAVR: AORTA: Minimal Aortic Diameter-13.7 mm Severity of Aortic Calcification-moderate RIGHT PELVIS:  Right Common Iliac Artery - Minimal Diameter-7.9 mm Tortuosity-severe Calcification-moderate Right External Iliac Artery - Minimal Diameter-8.6 mm Tortuosity-mild Calcification-none Right Common Femoral Artery - Minimal Diameter-7.7 mm Tortuosity-none Calcification-moderate LEFT PELVIS: Left Common Iliac Artery - Minimal Diameter-6.9 mm Tortuosity-mild Calcification-moderate Left External Iliac Artery - Minimal Diameter-8.3 mm Tortuosity-mild Calcification-none Left Common Femoral Artery - Minimal Diameter-7.3 mm Tortuosity-none Calcification-moderate Review of the MIP images confirms the above findings. IMPRESSION: Vascular: 1. Vascular findings and measurements pertinent to potential TAVR procedure, as detailed above. 2. 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 3. Severe aortoiliac atherosclerosis consisting of calcified and noncalcified plaque, including areas of ulcerated soft plaque of  the abdominal aorta. 4. Three-vessel coronary artery disease. Nonvascular: 1. Pulmonary edema, Improved when compared with the prior exam. 2. Moderate bilateral pleural effusions and bibasilar atelectasis, similar to prior. 3. Small solid subpleural nodule of the left upper lobe measuring 4 mm. No follow-up needed if patient is low-risk.This recommendation follows the consensus statement: Guidelines for Management of Incidental Pulmonary Nodules Detected on CT Images: From the Fleischner Society 2017; Radiology 2017; 284:228-243. Electronically Signed   By: Allegra Lai M.D.   On: 03/03/2023 11:16   CT Angio Abd/Pel w/ and/or w/o  Result Date: 03/03/2023 CLINICAL DATA:  Aortic valve replacement preop evaluation EXAM: CT ANGIOGRAPHY CHEST, ABDOMEN AND PELVIS TECHNIQUE: Non-contrast CT of the chest was initially obtained. Multidetector CT imaging through the chest, abdomen and pelvis was performed using the standard protocol during bolus administration of intravenous contrast.  Multiplanar reconstructed images and MIPs were obtained and reviewed to evaluate the vascular anatomy. RADIATION DOSE REDUCTION: This exam was performed according to the departmental dose-optimization program which includes automated exposure control, adjustment of the mA and/or kV according to patient size and/or use of iterative reconstruction technique. CONTRAST:  OMNIPAQUE IOHEXOL 350 MG/ML SOLN COMPARISON:  Chest CTA dated February 27, 2023 FINDINGS: CTA CHEST FINDINGS Cardiovascular: Cardiomegaly. No pericardial effusion. 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 Three-vessel coronary artery calcifications. Mediastinum/Nodes: Esophagus and thyroid are unremarkable. Mildly enlarged mediastinal lymph nodes which are likely reactive. Reference subcarinal lymph node measuring 1.3 cm in short axis on series 3, image 94. Lungs/Pleura: Central airways are patent. Central predominant ground-glass opacities, decreased when compared with the prior exam. Moderate bilateral pleural effusions atelectasis. No pneumothorax. Small solid subpleural nodule of the left upper lobe measuring 4 mm on series 7, image 53. Musculoskeletal: Prior median sternotomy. Mild no chest wall abnormality. No acute or significant osseous findings. CTA ABDOMEN AND PELVIS FINDINGS Hepatobiliary: No focal liver abnormality is seen. No gallstones, gallbladder wall thickening, or biliary dilatation. Pancreas: Unremarkable. No pancreatic ductal dilatation or surrounding inflammatory changes. Spleen: Normal in size without focal abnormality. Adrenals/Urinary Tract: Bilateral adrenal glands are unremarkable. No hydronephrosis or nephrolithiasis. Bladder is unremarkable. Stomach/Bowel: Stomach is within normal limits. No evidence of bowel wall thickening, distention, or inflammatory changes. Vascular/lymphatic: Normal caliber abdominal aorta with severe atherosclerotic disease including areas of ulcerated soft  plaque. 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 Other major branch vessels of the abdominal aorta are patent. No pathologically enlarged lymph nodes seen in the chest. Reproductive: Prostate is unremarkable. Other: No abdominal wall hernia or abnormality. No abdominopelvic ascites. Musculoskeletal: No acute or significant osseous findings. VASCULAR MEASUREMENTS PERTINENT TO TAVR: AORTA: Minimal Aortic Diameter-13.7 mm Severity of Aortic Calcification-moderate RIGHT PELVIS: Right Common Iliac Artery - Minimal Diameter-7.9 mm Tortuosity-severe Calcification-moderate Right External Iliac Artery - Minimal Diameter-8.6 mm Tortuosity-mild Calcification-none Right Common Femoral Artery - Minimal Diameter-7.7 mm Tortuosity-none Calcification-moderate LEFT PELVIS: Left Common Iliac Artery - Minimal Diameter-6.9 mm Tortuosity-mild Calcification-moderate Left External Iliac Artery - Minimal Diameter-8.3 mm Tortuosity-mild Calcification-none Left Common Femoral Artery - Minimal Diameter-7.3 mm Tortuosity-none Calcification-moderate Review of the MIP images confirms the above findings. IMPRESSION: Vascular: 1. Vascular findings and measurements pertinent to potential TAVR procedure, as detailed above. 2. 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 3. 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 4. Three-vessel coronary artery disease. Nonvascular: 1. Pulmonary edema,  Improved when compared with the prior exam. 2. Moderate bilateral pleural effusions and bibasilar atelectasis, similar to prior. 3. Small solid subpleural nodule of the left upper lobe measuring 4 mm. No follow-up needed if patient is low-risk.This recommendation follows the consensus statement: Guidelines for Management of Incidental Pulmonary Nodules Detected on CT Images: From  the Fleischner Society 2017; Radiology 2017; 284:228-243. Electronically Signed   By: Allegra Lai M.D.   On: 03/03/2023 11:16   DG CHEST PORT 1 VIEW  Result Date: 03/01/2023 CLINICAL DATA:  Pulmonary edema, shortness of breath with cough. EXAM: PORTABLE CHEST 1 VIEW COMPARISON:  Chest x-ray 09/28/2022 FINDINGS: Left perihilar airspace opacities are present. There are small bilateral pleural effusions. The cardiomediastinal silhouette is within normal limits. No acute fractures. IMPRESSION: 1. Left perihilar airspace opacities concerning for pneumonia. 2. Small bilateral pleural effusions. Electronically Signed   By: Darliss Cheney M.D.   On: 03/01/2023 16:23   ECHOCARDIOGRAM COMPLETE  Result Date: 02/28/2023    ECHOCARDIOGRAM REPORT   Patient Name:   Randy Duncan Date of Exam: 02/28/2023 Medical Rec #:  [id number]        Height:       67.0 in Accession #:    [id number]       Weight:       160.5 lb Date of Birth:  [birth year]       BSA:          1.842 m Patient Age:    76 years         BP:           127/17 mmHg Patient Gender: M                HR:           83 bpm. Exam Location:  Inpatient Procedure: 2D Echo, Cardiac Doppler and Color Doppler Indications:    CHF Diastolic  History:        Patient has prior history of Echocardiogram examinations. CAD,                 Signs/Symptoms:Fatigue; Risk Factors:Hypertension.                 Aortic Valve: 21 mm Magna pericardial valve is present in the                 aortic position. Procedure Date: 11/2006.  Sonographer:    Milda Smart Referring Phys: [id number] JESSICA U VANN IMPRESSIONS  1. Left ventricular ejection fraction, by estimation, is 60 to 65%. The left ventricle has normal function. The left ventricle has no regional wall motion abnormalities. Left ventricular diastolic parameters are indeterminate.  2. Right ventricular systolic function is normal. The right ventricular size is mildly enlarged.  3. Left atrial size was mild to moderately dilated.   4. Right atrial size was moderately dilated.  5. The mitral valve is degenerative. Mild mitral valve regurgitation. No evidence of mitral stenosis. Moderate mitral annular calcification.  6. Tricuspid valve regurgitation is mild to moderate.  7. The aortic valve has been repaired/replaced. Aortic valve regurgitation is severe. Mild aortic valve stenosis. There is a 21 mm Magna pericardial valve present in the aortic position. Procedure Date: 11/2006. Aortic regurgitation PHT measures 147 msec. Aortic valve area, by VTI measures 1.46 cm. Aortic valve mean gradient measures 20.0 mmHg. Aortic valve Vmax measures 3.08 m/s.  8. The inferior vena cava is dilated in size with <50% respiratory variability, suggesting right atrial pressure of  15 mmHg. Conclusion(s)/Recommendation(s): There is mild AS and severe AI over bioprosthetic AVR. Suggest TEE to further evalaute. FINDINGS  Left Ventricle: Left ventricular ejection fraction, by estimation, is 60 to 65%. The left ventricle has normal function. The left ventricle has no regional wall motion abnormalities. The left ventricular internal cavity size was normal in size. There is  no left ventricular hypertrophy. Left ventricular diastolic parameters are indeterminate. Right Ventricle: The right ventricular size is mildly enlarged. No increase in right ventricular wall thickness. Right ventricular systolic function is normal. Left Atrium: Left atrial size was mild to moderately dilated. Right Atrium: Right atrial size was moderately dilated. Pericardium: There is no evidence of pericardial effusion. Mitral Valve: The mitral valve is degenerative in appearance. Moderate mitral annular calcification. Mild mitral valve regurgitation. No evidence of mitral valve stenosis. Tricuspid Valve: The tricuspid valve is normal in structure. Tricuspid valve regurgitation is mild to moderate. No evidence of tricuspid stenosis. Aortic Valve: The aortic valve has been repaired/replaced.  Aortic valve regurgitation is severe. Aortic regurgitation PHT measures 147 msec. Mild aortic stenosis is present. Aortic valve mean gradient measures 20.0 mmHg. Aortic valve peak gradient measures 37.9 mmHg. Aortic valve area, by VTI measures 1.46 cm. There is a 21 mm Magna pericardial valve present in the aortic position. Procedure Date: 11/2006. Pulmonic Valve: The pulmonic valve was normal in structure. Pulmonic valve regurgitation is trivial. No evidence of pulmonic stenosis. Aorta: The aortic root is normal in size and structure. Venous: The inferior vena cava is dilated in size with less than 50% respiratory variability, suggesting right atrial pressure of 15 mmHg. IAS/Shunts: No atrial level shunt detected by color flow Doppler. Additional Comments: There is pleural effusion in the left lateral region.  LEFT VENTRICLE PLAX 2D LVIDd:         4.80 cm      Diastology LVIDs:         3.00 cm      LV e' medial:    6.20 cm/s LV PW:         0.80 cm      LV E/e' medial:  21.8 LV IVS:        0.80 cm      LV e' lateral:   4.90 cm/s LVOT diam:     1.90 cm      LV E/e' lateral: 27.6 LV SV:         93 LV SV Index:   50 LVOT Area:     2.84 cm  LV Volumes (MOD) LV vol d, MOD A2C: 91.7 ml LV vol d, MOD A4C: 100.0 ml LV vol s, MOD A2C: 32.8 ml LV vol s, MOD A4C: 32.6 ml LV SV MOD A2C:     58.9 ml LV SV MOD A4C:     100.0 ml LV SV MOD BP:      62.8 ml RIGHT VENTRICLE             IVC RV S prime:     12.10 cm/s  IVC diam: 2.00 cm TAPSE (M-mode): 1.9 cm LEFT ATRIUM             Index        RIGHT ATRIUM           Index LA diam:        3.80 cm 2.06 cm/m   RA Area:     16.80 cm LA Vol (A2C):   44.5 ml 24.16 ml/m  RA Volume:   47.40 ml  25.74 ml/m LA Vol (A4C):   39.2 ml 21.28 ml/m LA Biplane Vol: 43.8 ml 23.78 ml/m  AORTIC VALVE AV Area (Vmax):    1.53 cm AV Area (Vmean):   1.50 cm AV Area (VTI):     1.46 cm AV Vmax:           308.00 cm/s AV Vmean:          208.000 cm/s AV VTI:            0.635 m AV Peak Grad:      37.9  mmHg AV Mean Grad:      20.0 mmHg LVOT Vmax:         166.00 cm/s LVOT Vmean:        110.000 cm/s LVOT VTI:          0.328 m LVOT/AV VTI ratio: 0.52 AI PHT:            147 msec  AORTA Ao Root diam: 2.80 cm Ao Asc diam:  3.20 cm MITRAL VALVE                TRICUSPID VALVE MV Area (PHT): 4.29 cm     TR Peak grad:   51.3 mmHg MV Decel Time: 177 msec     TR Mean grad:   36.0 mmHg MR Peak grad: 118.8 mmHg    TR Vmax:        358.00 cm/s MR Mean grad: 75.5 mmHg     TR Vmean:       291.0 cm/s MR Vmax:      545.00 cm/s MR Vmean:     411.5 cm/s    SHUNTS MV E velocity: 135.00 cm/s  Systemic VTI:  0.33 m                             Systemic Diam: 1.90 cm Arvilla Meres MD Electronically signed by Arvilla Meres MD Signature Date/Time: 02/28/2023/4:51:21 PM    Final    CT Angio Chest PE W and/or Wo Contrast  Result Date: 02/27/2023 CLINICAL DATA:  Pulmonary embolism (PE) suspected, high prob EXAM: CT ANGIOGRAPHY CHEST WITH CONTRAST TECHNIQUE: Multidetector CT imaging of the chest was performed using the standard protocol during bolus administration of intravenous contrast. Multiplanar CT image reconstructions and MIPs were obtained to evaluate the vascular anatomy. RADIATION DOSE REDUCTION: This exam was performed according to the departmental dose-optimization program which includes automated exposure control, adjustment of the mA and/or kV according to patient size and/or use of iterative reconstruction technique. CONTRAST:  75mL OMNIPAQUE IOHEXOL 350 MG/ML SOLN COMPARISON:  X-ray 02/27/2023 FINDINGS: Cardiovascular: Satisfactory opacification of the pulmonary arteries to the segmental level. No evidence of pulmonary embolism. Thoracic aorta is nonaneurysmal. Aortic valve replacement. Scattered atherosclerotic vascular calcifications of the aorta and coronary arteries. Normal heart size. No pericardial effusion. Mediastinum/Nodes: Mildly prominent mediastinal lymph nodes including 1.1 cm precarinal node (series 4,  image 56) and 1.3 cm AP window node (series 4, image 50). Suspect mildly prominent bilateral hilar lymph nodes, evaluation of which is limited in the presence of dense perihilar opacities. No axillary lymphadenopathy. Thyroid gland, trachea, and esophagus demonstrate no significant abnormality. Lungs/Pleura: Moderate layering bilateral pleural effusions with compressive atelectasis of the bilateral lower lobes. Dense airspace consolidations within the bilateral perihilar regions and within the right upper lobe. Surrounding ground-glass opacity. Diffuse interlobular septal thickening. No pneumothorax. Upper Abdomen: No acute abnormality. Musculoskeletal: Prior sternotomy. No acute bony abnormality.  No chest wall abnormality. Review of the MIP images confirms the above findings. IMPRESSION: 1. No evidence of pulmonary embolism. 2. Dense airspace consolidations within the bilateral perihilar regions and within the right upper lobe with surrounding ground-glass opacity. Findings are favored to represent pulmonary edema. Superimposed pneumonia would be difficult to exclude. 3. Moderate layering bilateral pleural effusions with compressive atelectasis of the bilateral lower lobes. 4. Mildly prominent mediastinal and hilar lymph nodes, likely reactive. 5. Aortic and coronary artery atherosclerosis (ICD10-I70.0). Electronically Signed   By: Duanne Guess D.O.   On: 02/27/2023 12:49   DG Chest 2 View  Result Date: 02/27/2023 CLINICAL DATA:  Shortness of breath and cough for 1 day. EXAM: CHEST - 2 VIEW COMPARISON:  01/19/2023 FINDINGS: Heart size remains within normal limits. Prior median sternotomy and aortic valve replacement noted. Bilateral central pulmonary airspace opacity is seen in a bat wing configuration which is new and suspicious for acute pulmonary edema, with infection considered less likely. Tiny right pleural effusion also noted. IMPRESSION: New bilateral central pulmonary airspace opacity in bat wing  configuration, suspicious for acute pulmonary edema, with infection considered less likely. Tiny right pleural effusion. Electronically Signed   By: Danae Orleans M.D.   On: 02/27/2023 09:32     TODAY-DAY OF DISCHARGE:  Subjective:   Randy Duncan today has no headache,no chest abdominal pain,no new weakness tingling or numbness, feels much better wants to go home today.   Objective:   Blood pressure 137/61, pulse 76, temperature (!) 97.5 F (36.4 C), temperature source Oral, resp. rate 20, height 5\' 7"  (1.702 m), weight 73 kg, SpO2 92 %.  Intake/Output Summary (Last 24 hours) at 03/05/2023 0951 Last data filed at 03/05/2023 0500 Gross per 24 hour  Intake 700 ml  Output 700 ml  Net 0 ml   Filed Weights   03/03/23 0100 03/04/23 0409 03/05/23 0706  Weight: 67.3 kg 72.9 kg 73 kg    Exam: Awake Alert, Oriented *3, No new F.N deficits, Normal affect [place].AT,PERRAL Supple Neck,No JVD, No cervical lymphadenopathy appriciated.  Symmetrical Chest wall movement, Good air movement bilaterally, CTAB RRR,No Gallops,Rubs or new Murmurs, No Parasternal Heave +ve B.Sounds, Abd Soft, Non tender, No organomegaly appriciated, No rebound -guarding or rigidity. No Cyanosis, Clubbing or edema, No new Rash or bruise   PERTINENT RADIOLOGIC STUDIES: ECHO TEE  Result Date: 03/04/2023    TRANSESOPHOGEAL ECHO REPORT   Patient Name:   Randy Duncan Date of Exam: 03/04/2023 Medical Rec #:  [id number]        Height:       67.0 in Accession #:    [id number]       Weight:       160.7 lb Date of Birth:  08/06/1946       BSA:          1.843 m Patient Age:    69 years         BP:           99/44 mmHg Patient Gender: M                HR:           66 bpm. Exam Location:  Inpatient Procedure: Transesophageal Echo, Cardiac Doppler, Color Doppler and 3D Echo Indications:     aortic regurgitation  History:         Patient has prior history of Echocardiogram examinations, most  recent 02/28/2023. CHF, CAD,  ascending aorta repair; Risk                  Factors:Hypertension, Diabetes and Former Smoker.                  Aortic Valve: valve is present in the aortic position.                  Procedure Date: 2008.  Sonographer:     Delcie Roch RDCS Referring Phys:  [id number] Roe Rutherford [facility] Diagnosing Phys: Riley Lam MD PROCEDURE: After discussion of the risks and benefits of a TEE, an informed consent was obtained from the patient. The transesophogeal probe was passed without difficulty through the esophogus of the patient. Imaged were obtained with the patient in a left lateral decubitus position. Sedation performed by different physician. The patient was monitored while under deep sedation. Anesthestetic sedation was provided intravenously by Anesthesiology: 288mg  of Propofol, 100mg  of Lidocaine. The patient developed no complications during the procedure.  IMPRESSIONS  1. 21 mm Edwards 3000 TFX Perimount Magna Ease is present. There is mild paravalvular leak along the intra-valvular fibrosa. There is severe aortic regurgitation that is very eccentric and appears to be related to prolapse. There is holodiastolic flow reversal by Spectral Doppler. The aortic valve has been repaired/replaced. Aortic valve regurgitation is severe. There is a valve present in the aortic position. Procedure Date: 2008. Echo findings are consistent with stenosis and regurgitation of the aortic prosthesis. Aortic valve mean gradient measures 22.0 mmHg. Aortic valve Vmax measures 2.99 m/s. Aortic valve acceleration time measures 104 msec.  2. There is an intra-atrial membrane that attaches on the mid inferior portion of the septum and terminates in the posterior lateral portion of the right atrium. It does not cause turbulent color Doppler flow. No left atrial/left atrial appendage thrombus was detected.  3. Left ventricular ejection fraction, by estimation, is 60 to 65%. The left ventricle has normal function.  4. Right  ventricular systolic function is normal. The right ventricular size is moderately enlarged.  5. The mitral valve is degenerative. Mild to moderate mitral valve regurgitation. No evidence of mitral stenosis.  6. Tricuspid valve regurgitation is mild to moderate.  7. There is mild (Grade II) plaque involving the ascending aorta, aortic arch and descending aorta. FINDINGS  Left Ventricle: Left ventricular ejection fraction, by estimation, is 60 to 65%. The left ventricle has normal function. The left ventricular internal cavity size was normal in size. Right Ventricle: The right ventricular size is moderately enlarged. Right vetricular wall thickness was not well visualized. Right ventricular systolic function is normal. Left Atrium: There is an intra-atrial membrane that attaches on the mid inferior portion of the septum and terminates in the posterior lateral portion of the right atrium. It does not cause turbulent color Doppler flow. Left atrial size was normal in size. No left atrial/left atrial appendage thrombus was detected. Right Atrium: Right atrial size was normal in size. Pericardium: Trivial pericardial effusion is present. The pericardial effusion is circumferential. Mitral Valve: The mitral valve is degenerative in appearance. Mild to moderate mitral valve regurgitation. No evidence of mitral valve stenosis. Tricuspid Valve: The tricuspid valve is normal in structure. Tricuspid valve regurgitation is mild to moderate. No evidence of tricuspid stenosis. Aortic Valve: 21 mm Edwards 3000 TFX Perimount Magna Ease is present. There is mild paravalvular leak along the intra-valvular fibrosa. There is severe aortic regurgitation that is very eccentric and appears to be  related to prolapse. There is holodiastolic flow reversal by Spectral Doppler. The aortic valve has been repaired/replaced. Aortic valve regurgitation is severe. Aortic valve mean gradient measures 22.0 mmHg. Aortic valve peak gradient measures  35.8 mmHg. Aortic valve area, by VTI measures 0.94 cm. There is a valve present in the aortic position. Procedure Date: 2008. Pulmonic Valve: The pulmonic valve was grossly normal. Pulmonic valve regurgitation is not visualized. Aorta: The aortic root, ascending aorta, aortic arch and descending aorta are all structurally normal, with no evidence of dilitation or obstruction. There is mild (Grade II) plaque involving the ascending aorta, aortic arch and descending aorta. IAS/Shunts: No atrial level shunt detected by color flow Doppler.  LEFT VENTRICLE PLAX 2D LVOT diam:     1.70 cm LV SV:         70 LV SV Index:   38 LVOT Area:     2.27 cm  AORTIC VALVE AV Area (Vmax):    1.00 cm AV Area (Vmean):   0.89 cm AV Area (VTI):     0.94 cm AV Vmax:           299.00 cm/s AV Vmean:          218.000 cm/s AV VTI:            0.746 m AV Peak Grad:      35.8 mmHg AV Mean Grad:      22.0 mmHg LVOT Vmax:         132.00 cm/s LVOT Vmean:        85.100 cm/s LVOT VTI:          0.308 m LVOT/AV VTI ratio: 0.41  AORTA Ao Asc diam: 2.30 cm  SHUNTS Systemic VTI:  0.31 m Systemic Diam: 1.70 cm Riley Lam MD Electronically signed by Riley Lam MD Signature Date/Time: 03/04/2023/5:50:42 PM    Final    EP STUDY  Result Date: 03/04/2023 See surgical note for result.  DG Orthopantogram  Result Date: 03/03/2023 CLINICAL DATA:  Poor dentition. EXAM: ORTHOPANTOGRAM/PANORAMIC COMPARISON:  None Available. FINDINGS: Very poor dentition is noted with many missing teeth. No definite fracture or lytic lesion is seen involving the mandible. At least 1 tooth in the left mandible has significant erosion of its crown. IMPRESSION: No definite fracture or lytic lesion is seen involving the mandible. Electronically Signed   By: Lupita Raider M.D.   On: 03/03/2023 15:41   CT CORONARY MORPH W/CTA COR W/SCORE W/CA W/CM &/OR WO/CM  Addendum Date: 03/03/2023   ADDENDUM REPORT: 03/03/2023 12:15 CLINICAL DATA:  Pre Valve in Valve  TAVR EXAM: Cardiac TAVR CT TECHNIQUE: The patient was scanned on a Siemens Force 192 slice scanner. A 120 kV retrospective scan was triggered in the descending thoracic aorta at 111 HU's. Gantry rotation speed was 270 msecs and collimation was .9 mm. No beta blockade or nitro were given. The 3D data set was reconstructed in 5% intervals of the R-R cycle. Systolic and diastolic phases were analyzed on a dedicated work station using MPR, MIP and VRT modes. The patient received 80 cc of contrast. FINDINGS: Aortic Valve: Has been replaced in 2008. 21 mm Edwards 3000 TFX Perimount Magna stented bioprosthetic valve The stent ID is 20 mm the valve height is 15 mm And the True ID is 19 mm The leaflets are thickened with mild calcification there is some prolapse of the non coronary cusp. There is a possible area of annular dehiscence near the left cusp measuring 4.6 mm x  3.7 mm with area of 0.15 cm2. Inferior to this in the LVOT is a large area of calcium at the base of the anterior mitral leaflet and intervalvular fibrosa. Aorta: There appears to be a straight graft repair of ascending thoracic aorta above the AVR Suture lines intact with no aneurysm Sinotubular Junction: 28 mm Ascending Thoracic Aorta: 29.5 mm Aortic Arch: 31 nn Descending Thoracic Aorta: Very tortuous 25 mm Sinus of Valsalva Measurements: Non-coronary: 33.3 mm Right - coronary: 30.6 mm Left - coronary: 32 mm Coronary Artery Height above Annulus: Left Main: 20.3 mm above annulus Right Coronary: 17.5 mm above annulus Coronary Arteries: Sufficient height above annulus for deployment The coronary arteries arise aboe the embedded geometry of the valve and do Not appear to be at risk for occlusion Optimum Fluoroscopic Angle for Delivery: RAO 7 Caudal 13 degrees Without balloon fracture a 20 mm Sapien 3 valve appears to fit the ID of the valve better If the valve is dilated with a 22 balloon possible use of 23 Sapien valve IMPRESSION: 1. 77 yo Edwards 3000 TFX  Perimount Magna Valve with thickened leaflets and mild calcium. 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 Coronary arteries quite high and not at risk for occlusion with valve in valve procedure being above simulated valve implant with Sapien 3. Valve in Valve app suggests using 20 mm Sapien vs fracture with 22 balloon and using a 23 mm Sapien. Embedded geometry confirms that to fit a 23 mm valve the annulus would need balloon fracture 4. 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 Optimum angiographic angle for deployment RAO 7 Caudal 13 degrees with aortic root contrast injection to try and visualize hinge points for leaflets Charlton Haws Electronically Signed   By: Charlton Haws M.D.   On: 03/03/2023 12:15   Result Date: 03/03/2023 EXAM: OVER-READ INTERPRETATION  CT CHEST The following report is a limited chest CT over-read performed by radiologist Dr. Allegra Lai of [facility] on 03/03/2023. This over-read does not include interpretation of cardiac or coronary anatomy or pathology. The cardiac TAVR interpretation by the cardiologist is attached. COMPARISON:  None Available. FINDINGS: Extracardiac findings will be described separately under dictation for contemporaneously obtained CTA chest, abdomen and pelvis. IMPRESSION: Please see separate dictation for contemporaneously obtained CTA chest, abdomen and pelvis dated 03/03/2023 for full description of relevant extracardiac findings. Electronically Signed: By: Allegra Lai M.D. On: 03/03/2023 11:22   CT ANGIO CHEST AORTA W/CM & OR WO/CM  Result Date: 03/03/2023 CLINICAL DATA:  Aortic valve replacement preop evaluation EXAM: CT ANGIOGRAPHY CHEST, ABDOMEN AND PELVIS  TECHNIQUE: Non-contrast CT of the chest was initially obtained. Multidetector CT imaging through the chest, abdomen and pelvis was performed using the standard protocol during bolus administration of intravenous contrast. Multiplanar reconstructed images and MIPs were obtained and reviewed to evaluate the vascular anatomy. RADIATION DOSE REDUCTION: This exam was performed according to the departmental dose-optimization program which includes automated exposure control, adjustment of the mA and/or kV according to patient size and/or use of iterative reconstruction technique. CONTRAST:  OMNIPAQUE IOHEXOL 350 MG/ML SOLN COMPARISON:  Chest CTA dated February 27, 2023 FINDINGS: CTA CHEST FINDINGS Cardiovascular: Cardiomegaly. No pericardial effusion. 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 Three-vessel coronary artery calcifications. Mediastinum/Nodes: Esophagus and thyroid are unremarkable. Mildly enlarged mediastinal lymph nodes which are likely reactive. Reference subcarinal lymph node measuring 1.3 cm in short axis on series 3, image 94. Lungs/Pleura: Central airways are patent. Central predominant ground-glass opacities, decreased when compared with the prior exam. Moderate bilateral pleural effusions atelectasis. No pneumothorax. Small solid subpleural nodule of the left upper lobe measuring 4 mm on series 7, image 53. Musculoskeletal: Prior median sternotomy. Mild no chest wall abnormality. No acute or significant osseous findings. CTA ABDOMEN AND PELVIS FINDINGS Hepatobiliary: No focal liver abnormality is seen. No gallstones, gallbladder wall thickening, or biliary dilatation. Pancreas: Unremarkable. No pancreatic ductal dilatation or surrounding inflammatory changes. Spleen: Normal in size without focal abnormality. Adrenals/Urinary Tract: Bilateral adrenal glands are unremarkable. No hydronephrosis or nephrolithiasis. Bladder is unremarkable. Stomach/Bowel: Stomach is  within normal limits. No evidence of bowel wall thickening, distention, or inflammatory changes. Vascular/lymphatic: Normal caliber abdominal aorta with severe atherosclerotic disease including areas of ulcerated soft plaque. 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 Other major branch vessels of the abdominal aorta are patent. No pathologically enlarged lymph nodes seen in the chest. Reproductive: Prostate is unremarkable. Other: No abdominal wall hernia or abnormality. No abdominopelvic ascites. Musculoskeletal: No acute or significant osseous findings. VASCULAR MEASUREMENTS PERTINENT TO TAVR: AORTA: Minimal Aortic Diameter-13.7 mm Severity of Aortic Calcification-moderate RIGHT PELVIS: Right Common Iliac Artery - Minimal Diameter-7.9 mm Tortuosity-severe Calcification-moderate Right External Iliac Artery - Minimal Diameter-8.6 mm Tortuosity-mild Calcification-none Right Common Femoral Artery - Minimal Diameter-7.7 mm Tortuosity-none Calcification-moderate LEFT PELVIS: Left Common Iliac Artery - Minimal Diameter-6.9 mm Tortuosity-mild Calcification-moderate Left External Iliac Artery - Minimal Diameter-8.3 mm Tortuosity-mild Calcification-none Left Common Femoral Artery - Minimal Diameter-7.3 mm Tortuosity-none Calcification-moderate Review of the MIP images confirms the above findings. IMPRESSION: Vascular: 1. Vascular findings and measurements pertinent to potential TAVR procedure, as detailed above. 2. 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 3. 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 4. Three-vessel coronary artery disease. Nonvascular: 1. Pulmonary edema, Improved when compared with the prior exam. 2. Moderate bilateral pleural effusions and bibasilar atelectasis, similar to prior. 3. Small solid subpleural nodule of  the left upper lobe measuring 4 mm. No follow-up needed if patient is low-risk.This recommendation follows the consensus statement: Guidelines for Management of Incidental Pulmonary Nodules Detected on CT Images: From the Fleischner Society 2017; Radiology 2017; 284:228-243. Electronically Signed   By: Allegra Lai M.D.   On: 03/03/2023 11:16   CT Angio Abd/Pel w/ and/or w/o  Result Date: 03/03/2023 CLINICAL DATA:  Aortic valve replacement preop evaluation EXAM: CT ANGIOGRAPHY CHEST, ABDOMEN AND PELVIS TECHNIQUE: Non-contrast CT of the chest was initially obtained. Multidetector CT imaging through the chest, abdomen and pelvis was performed using the standard protocol during bolus administration of intravenous contrast. Multiplanar reconstructed images and MIPs were obtained and reviewed to evaluate the vascular anatomy. RADIATION DOSE REDUCTION: This exam was performed according to the departmental dose-optimization program which includes automated exposure control, adjustment of the mA and/or kV according to patient size and/or use of iterative reconstruction technique. CONTRAST:  OMNIPAQUE IOHEXOL 350 MG/ML SOLN COMPARISON:  Chest CTA dated February 27, 2023 FINDINGS: CTA CHEST FINDINGS Cardiovascular: Cardiomegaly. No pericardial effusion. 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 Three-vessel coronary artery calcifications. Mediastinum/Nodes:  Esophagus and thyroid are unremarkable. Mildly enlarged mediastinal lymph nodes which are likely reactive. Reference subcarinal lymph node measuring 1.3 cm in short axis on series 3, image 94. Lungs/Pleura: Central airways are patent. Central predominant ground-glass opacities, decreased when compared with the prior exam. Moderate bilateral pleural effusions atelectasis. No pneumothorax. Small solid subpleural nodule of the left upper lobe measuring 4 mm on series 7, image 53. Musculoskeletal: Prior median sternotomy. Mild  no chest wall abnormality. No acute or significant osseous findings. CTA ABDOMEN AND PELVIS FINDINGS Hepatobiliary: No focal liver abnormality is seen. No gallstones, gallbladder wall thickening, or biliary dilatation. Pancreas: Unremarkable. No pancreatic ductal dilatation or surrounding inflammatory changes. Spleen: Normal in size without focal abnormality. Adrenals/Urinary Tract: Bilateral adrenal glands are unremarkable. No hydronephrosis or nephrolithiasis. Bladder is unremarkable. Stomach/Bowel: Stomach is within normal limits. No evidence of bowel wall thickening, distention, or inflammatory changes. Vascular/lymphatic: Normal caliber abdominal aorta with severe atherosclerotic disease including areas of ulcerated soft plaque. 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 Other major branch vessels of the abdominal aorta are patent. No pathologically enlarged lymph nodes seen in the chest. Reproductive: Prostate is unremarkable. Other: No abdominal wall hernia or abnormality. No abdominopelvic ascites. Musculoskeletal: No acute or significant osseous findings. VASCULAR MEASUREMENTS PERTINENT TO TAVR: AORTA: Minimal Aortic Diameter-13.7 mm Severity of Aortic Calcification-moderate RIGHT PELVIS: Right Common Iliac Artery - Minimal Diameter-7.9 mm Tortuosity-severe Calcification-moderate Right External Iliac Artery - Minimal Diameter-8.6 mm Tortuosity-mild Calcification-none Right Common Femoral Artery - Minimal Diameter-7.7 mm Tortuosity-none Calcification-moderate LEFT PELVIS: Left Common Iliac Artery - Minimal Diameter-6.9 mm Tortuosity-mild Calcification-moderate Left External Iliac Artery - Minimal Diameter-8.3 mm Tortuosity-mild Calcification-none Left Common Femoral Artery - Minimal Diameter-7.3 mm Tortuosity-none Calcification-moderate Review of the MIP images confirms the above findings. IMPRESSION: Vascular: 1. Vascular findings and measurements pertinent to  potential TAVR procedure, as detailed above. 2. 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 3. 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 4. Three-vessel coronary artery disease. Nonvascular: 1. Pulmonary edema, Improved when compared with the prior exam. 2. Moderate bilateral pleural effusions and bibasilar atelectasis, similar to prior. 3. Small solid subpleural nodule of the left upper lobe measuring 4 mm. No follow-up needed if patient is low-risk.This recommendation follows the consensus statement: Guidelines for Management of Incidental Pulmonary Nodules Detected on CT Images: From the Fleischner Society 2017; Radiology 2017; 284:228-243. Electronically Signed   By: Allegra Lai M.D.   On: 03/03/2023 11:16     PERTINENT LAB RESULTS: CBC: Recent Labs    03/05/23 0736  WBC 8.5  HGB 10.8*  HCT 30.9*  PLT 199   CMET CMP     Component Value Date/Time   NA 132 (L) 03/05/2023 0736   NA 137 06/20/2013 1247   K 4.6 03/05/2023 0736   K 4.0 06/20/2013 1247   CL 100 03/05/2023 0736   CL 106 06/20/2013 1247   CO2 24 03/05/2023 0736   CO2 28 06/20/2013 1247   GLUCOSE 128 (H) 03/05/2023 0736   GLUCOSE 128 (H) 06/20/2013 1247   BUN 13 03/05/2023 0736   BUN 15 06/20/2013 1247   CREATININE 1.05 03/05/2023 0736   CREATININE 1.03 06/20/2013 1247   CALCIUM 8.5 (L) 03/05/2023 0736   CALCIUM 8.7 06/20/2013 1247   PROT 5.1 (L) 02/28/2023 0306   PROT 6.9 06/20/2013 1247   ALBUMIN 2.5 (L) 02/28/2023 2952  ALBUMIN 3.8 06/20/2013 1247   AST 30 02/28/2023 0306   AST 41 (H) 06/20/2013 1247   ALT 39 02/28/2023 0306   ALT 64 06/20/2013 1247   ALKPHOS 46 02/28/2023 0306   ALKPHOS 82 06/20/2013 1247   BILITOT 1.0 02/28/2023 0306   BILITOT 0.3 06/20/2013 1247   GFRNONAA >60 03/05/2023 0736   GFRNONAA >60 06/20/2013 1247    GFR Estimated Creatinine  Clearance: 55.1 mL/min (by C-G formula based on SCr of 1.05 mg/dL). No results for input(s): "LIPASE", "AMYLASE" in the last 72 hours. No results for input(s): "CKTOTAL", "CKMB", "CKMBINDEX", "TROPONINI" in the last 72 hours. Invalid input(s): "POCBNP" No results for input(s): "DDIMER" in the last 72 hours. No results for input(s): "HGBA1C" in the last 72 hours. No results for input(s): "CHOL", "HDL", "LDLCALC", "TRIG", "CHOLHDL", "LDLDIRECT" in the last 72 hours. No results for input(s): "TSH", "T4TOTAL", "T3FREE", "THYROIDAB" in the last 72 hours.  Invalid input(s): "FREET3" No results for input(s): "VITAMINB12", "FOLATE", "FERRITIN", "TIBC", "IRON", "RETICCTPCT" in the last 72 hours. Coags: No results for input(s): "INR" in the last 72 hours.  Invalid input(s): "PT" Microbiology: Recent Results (from the past 240 hour(s))  Blood culture (routine x 2)     Status: None   Collection Time: 02/27/23 10:05 AM   Specimen: Right Antecubital; Blood  Result Value Ref Range Status   Specimen Description   Final    RIGHT ANTECUBITAL BLOOD Performed at [facility], [address] [place], [place] [address]    Special Requests   Final    BOTTLES DRAWN AEROBIC AND ANAEROBIC Blood Culture adequate volume Performed at [facility], [address], [place], [place] [address]    Culture   Final    NO GROWTH 5 DAYS Performed at [facility], [address], [place], [place] [address]    Report Status 03/04/2023 FINAL  Final  Blood culture (routine x 2)     Status: None   Collection Time: 02/27/23 10:06 AM   Specimen: BLOOD LEFT ARM  Result Value Ref Range Status   Specimen Description   Final    BLOOD LEFT ARM Performed at [facility], [address], [place], [place] [address]    Special Requests   Final    BOTTLES DRAWN AEROBIC AND ANAEROBIC Blood Culture adequate volume Performed at [facility], [address] [place], [place] [address]    Culture   Final    NO GROWTH 5 DAYS Performed at [facility], [address], [place], [place] [address]    Report Status 03/04/2023 FINAL  Final  Resp panel by RT-PCR (RSV, Flu A&B, Covid) Anterior Nasal Swab     Status: None   Collection Time: 02/27/23 12:43 PM   Specimen: Anterior Nasal Swab  Result Value Ref Range Status   SARS Coronavirus 2 by RT PCR NEGATIVE NEGATIVE Final    Comment: (NOTE) SARS-CoV-2 target nucleic acids are NOT DETECTED.  The SARS-CoV-2 RNA is generally detectable in upper respiratory specimens during the acute phase of infection. The lowest concentration of SARS-CoV-2 viral copies this assay can detect is 138 copies/mL. A negative result does not preclude SARS-Cov-2 infection and should not be used as the sole basis for treatment or other patient management decisions. A negative result may occur with  improper specimen collection/handling, submission of specimen other than nasopharyngeal swab, presence of viral mutation(s) within the areas targeted by this assay, and inadequate number  of viral copies(<138 copies/mL). A negative result must be combined with clinical observations, patient history, and epidemiological information. The expected result is Negative.  Fact Sheet for Patients:  BloggerCourse.com  Fact Sheet for Healthcare Providers:  SeriousBroker.it  This test is no t yet approved or cleared by the [place] FDA and  has been authorized for detection and/or diagnosis of SARS-CoV-2 by FDA under an Emergency Use Authorization (EUA). This EUA will remain  in effect (meaning this test can be used) for the duration of the COVID-19 declaration under Section 564(b)(1) of the Act, 21 U.S.C.section 360bbb-3(b)(1), unless the authorization is terminated  or revoked sooner.       Influenza A by PCR NEGATIVE NEGATIVE Final   Influenza B by PCR NEGATIVE  NEGATIVE Final    Comment: (NOTE) The Xpert Xpress SARS-CoV-2/FLU/RSV plus assay is intended as an aid in the diagnosis of influenza from Nasopharyngeal swab specimens and should not be used as a sole basis for treatment. Nasal washings and aspirates are unacceptable for Xpert Xpress SARS-CoV-2/FLU/RSV testing.  Fact Sheet for Patients: BloggerCourse.com  Fact Sheet for Healthcare Providers: SeriousBroker.it  This test is not yet approved or cleared by the [place] FDA and has been authorized for detection and/or diagnosis of SARS-CoV-2 by FDA under an Emergency Use Authorization (EUA). This EUA will remain in effect (meaning this test can be used) for the duration of the COVID-19 declaration under Section 564(b)(1) of the Act, 21 U.S.C. section 360bbb-3(b)(1), unless the authorization is terminated or revoked.     Resp Syncytial Virus by PCR NEGATIVE NEGATIVE Final    Comment: (NOTE) Fact Sheet for Patients: BloggerCourse.com  Fact Sheet for Healthcare Providers: SeriousBroker.it  This test is not yet approved or cleared by the [place] FDA and has been authorized for detection and/or diagnosis of SARS-CoV-2 by FDA under an Emergency Use Authorization (EUA). This EUA will remain in effect (meaning this test can be used) for the duration of the COVID-19 declaration under Section 564(b)(1) of the Act, 21 U.S.C. section 360bbb-3(b)(1), unless the authorization is terminated or revoked.  Performed at [facility], [address], [place], [place] [address]   Respiratory (~20 pathogens) panel by PCR     Status: None   Collection Time: 02/27/23 12:43 PM   Specimen: Nasopharyngeal Swab; Respiratory  Result Value Ref Range Status   Adenovirus NOT DETECTED NOT DETECTED Final   Coronavirus 229E NOT DETECTED NOT DETECTED Final    Comment: (NOTE) The  Coronavirus on the Respiratory Panel, DOES NOT test for the novel  Coronavirus (2019 nCoV)    Coronavirus HKU1 NOT DETECTED NOT DETECTED Final   Coronavirus NL63 NOT DETECTED NOT DETECTED Final   Coronavirus OC43 NOT DETECTED NOT DETECTED Final   Metapneumovirus NOT DETECTED NOT DETECTED Final   Rhinovirus / Enterovirus NOT DETECTED NOT DETECTED Final   Influenza A NOT DETECTED NOT DETECTED Final   Influenza B NOT DETECTED NOT DETECTED Final   Parainfluenza Virus 1 NOT DETECTED NOT DETECTED Final   Parainfluenza Virus 2 NOT DETECTED NOT DETECTED Final   Parainfluenza Virus 3 NOT DETECTED NOT DETECTED Final   Parainfluenza Virus 4 NOT DETECTED NOT DETECTED Final   Respiratory Syncytial Virus NOT DETECTED NOT DETECTED Final   Bordetella pertussis NOT DETECTED NOT DETECTED Final   Bordetella Parapertussis NOT DETECTED NOT DETECTED Final   Chlamydophila pneumoniae NOT DETECTED NOT DETECTED Final   Mycoplasma pneumoniae NOT DETECTED NOT DETECTED Final    Comment: Performed at  [facility], [address] [place], [place] [address]  MRSA Next Gen by PCR, Nasal     Status: None   Collection Time: 02/28/23  8:57 AM   Specimen: Nasal Mucosa; Nasal Swab  Result Value Ref Range Status   MRSA by PCR Next Gen NOT DETECTED NOT DETECTED Final    Comment: (NOTE) The GeneXpert MRSA Assay (FDA approved for NASAL specimens only), is one component of a comprehensive MRSA colonization surveillance program. It is not intended to diagnose MRSA infection nor to guide or monitor treatment for MRSA infections. Test performance is not FDA approved in patients less than 20 years old. Performed at [facility], [address] [place] [address] [place], [place] [address]     FURTHER DISCHARGE INSTRUCTIONS:  Get Medicines reviewed and adjusted: Please take all your medications with you for your next visit with your Primary MD  Laboratory/radiological data: Please request your Primary MD to go  over all hospital tests and procedure/radiological results at the follow up, please ask your Primary MD to get all Hospital records sent to his/her office.  In some cases, they will be blood work, cultures and biopsy results pending at the time of your discharge. Please request that your primary care M.D. goes through all the records of your hospital data and follows up on these results.  Also Note the following: If you experience worsening of your admission symptoms, develop shortness of breath, life threatening emergency, suicidal or homicidal thoughts you must seek medical attention immediately by calling 911 or calling your MD immediately  if symptoms less severe.  You must read complete instructions/literature along with all the possible adverse reactions/side effects for all the Medicines you take and that have been prescribed to you. Take any new Medicines after you have completely understood and accpet all the possible adverse reactions/side effects.   Do not drive when taking Pain medications or sleeping medications (Benzodaizepines)  Do not take more than prescribed Pain, Sleep and Anxiety Medications. It is not advisable to combine anxiety,sleep and pain medications without talking with your primary care practitioner  Special Instructions: If you have smoked or chewed Tobacco  in the last 2 yrs please stop smoking, stop any regular Alcohol  and or any Recreational drug use.  Wear Seat belts while driving.  Please note: You were cared for by a hospitalist during your hospital stay. Once you are discharged, your primary care physician will handle any further medical issues. Please note that NO REFILLS for any discharge medications will be authorized once you are discharged, as it is imperative that you return to your primary care physician (or establish a relationship with a primary care physician if you do not have one) for your post hospital discharge needs so that they can reassess your  need for medications and monitor your lab values.  Total Time spent coordinating discharge including counseling, education and face to face time equals greater than 30 minutes.  SignedJeoffrey Massed 03/05/2023 9:51 AM

## 2023-03-05 NOTE — Progress Notes (Signed)
Nsg Discharge Note  Admit Date:  02/27/2023 Discharge date: 03/05/2023   Randy Duncan to be D/C'd Home per MD order. Patient/caregiver able to verbalize understanding.  Discharge Medication: Allergies as of 03/05/2023       Reactions   Losartan Potassium Itching, Swelling   Wheat Rash, Other (See Comments)   Celiac disease   Naproxen Other (See Comments)   Sweats and stomach upset    Amlodipine    Coreg [carvedilol]    Hydralazine Other (See Comments)   Cannot tolerate this at 3 times a day    Lisinopril    Bupropion Nausea Only, Other (See Comments)   Upset stomach   Cephalexin Nausea Only, Other (See Comments)   upset stomach        Medication List     TAKE these medications    aspirin EC 81 MG tablet Take 81 mg by mouth in the morning. Swallow whole.   atorvastatin 80 MG tablet Commonly known as: LIPITOR Take 80 mg by mouth at bedtime.   clonazePAM 0.5 MG tablet Commonly known as: KLONOPIN Take 0.25 mg by mouth every 4 (four) hours.   furosemide 40 MG tablet Commonly known as: LASIX Take 1 tablet (40 mg total) by mouth daily. Start taking on: March 06, 2023   guaiFENesin-dextromethorphan 100-10 MG/5ML syrup Commonly known as: ROBITUSSIN DM Take 5 mLs by mouth every 4 (four) hours as needed for cough.   hydrALAZINE 25 MG tablet Commonly known as: APRESOLINE Take 25 mg by mouth in the morning and at bedtime.   isosorbide mononitrate 30 MG 24 hr tablet Commonly known as: IMDUR Take 0.5 tablets (15 mg total) by mouth daily. Start taking on: March 06, 2023   metoprolol succinate 25 MG 24 hr tablet Commonly known as: TOPROL-XL Take 12.5 mg by mouth at bedtime.   ranolazine 1000 MG SR tablet Commonly known as: RANEXA Take 1,000 mg by mouth in the morning and at bedtime.   Vitamin D3 125 MCG (5000 UT) Caps Take 5,000 Units by mouth in the morning.   ZyrTEC Allergy 10 MG tablet Generic drug: cetirizine Take 10 mg by mouth See admin instructions.  Take 10 mg by mouth in the evening-bedtime, along with the 2nd daily dose of Hydralazine        Discharge Assessment: Vitals:   03/05/23 0400 03/05/23 0827  BP: (!) 125/45 137/61  Pulse: 79 76  Resp: 15 20  Temp: 97.6 F (36.4 C) (!) 97.5 F (36.4 C)  SpO2: 93% 92%   Skin clean, dry and intact without evidence of skin break down, no evidence of skin tears noted. IV catheter discontinued intact. Site without signs and symptoms of complications - no redness or edema noted at insertion site, patient denies c/o pain - only slight tenderness at site.  Dressing with slight pressure applied.  D/c Instructions-Education: Discharge instructions given to patient/family with verbalized understanding. D/c education completed with patient/family including follow up instructions, medication list, d/c activities limitations if indicated, with other d/c instructions as indicated by MD - patient able to verbalize understanding, all questions fully answered. Patient instructed to return to ED, call 911, or call MD for any changes in condition.  Patient escorted via WC, and D/C home via private auto.  Kizzie Bane, RN 03/05/2023 12:17 PM

## 2023-03-06 ENCOUNTER — Encounter: Payer: Self-pay | Admitting: Cardiovascular Disease

## 2023-03-07 ENCOUNTER — Telehealth: Payer: Self-pay | Admitting: *Deleted

## 2023-03-07 ENCOUNTER — Encounter (HOSPITAL_COMMUNITY): Payer: Self-pay | Admitting: Internal Medicine

## 2023-03-07 ENCOUNTER — Telehealth: Payer: Self-pay | Admitting: Cardiovascular Disease

## 2023-03-07 NOTE — Telephone Encounter (Signed)
Patient called in to say that he needs to have dental work done. Advise patient that office would need to contact us for the clearance, to determine if he can be cleared or not. Please advise

## 2023-03-07 NOTE — Progress Notes (Signed)
LATE ENTRY.   PT COMPLETED :  Pre Surgical Assessment: 5 M Walk Test  60M=16.27ft  5 Meter Walk Test- trial 1: 7.2 seconds 5 Meter Walk Test- trial 2: 6.9 seconds 5 Meter Walk Test- trial 3: 7.0 seconds 5 Meter Walk Test Average: 7.03 seconds

## 2023-03-07 NOTE — Telephone Encounter (Addendum)
   Pre-operative Risk Assessment    Patient Name: Randy Duncan  DOB: 1945/11/03 MRN: [id number]    I CALLED THE DENTAL OFFICE TO INFORM THEM THAT THE PT IS NOT FOLLOWED BY OUR PRACTICE AND IS FOLLOWED BY [facility]. DENTAL OFFICE STATES THE PT CAME TO THEM WITH A "BUNCH OF PAPERWORK" THAT DR. Eden Emms IS GOING TO BE DOING A PROCEDURE FOR HIS HEART VALVE AND WILL NEED TO HAVE THE DENTAL PROCEDURE DONE FIRST. I AGAIN STATED THAT WE DO NOT FOLLOW THE PT AS HIS PRIMARY CARDIOLOGIST AND THAT WE HAVE ONLY SEEN THE PT IN THE HOSPITAL. I INFORMED THE DENTAL OFFICE THAT I AM GOING TO HAVE TO RUN THIS PART THE PRE OP APP AND DR. Eden Emms FOR FURTHER ADVICE.  Request for Surgical Clearance    Procedure:  Dental Extraction - Amount of Teeth to be Pulled:  10 TEETH FOR SIMPLE EXTRACTIONS PER CLEARANCE FORM  Date of Surgery:  Clearance 03/10/23                                 Surgeon:  DR. Eda Keys, DDS Surgeon's Group or Practice Name:   Phone number:  [phone] Fax number:  [phone]   Type of Clearance Requested:   - Medical ; NO MEDICATIONS LISTED AS NEEDING TO BE HELD   Type of Anesthesia:  General    Additional requests/questions:    Elpidio Anis   03/07/2023, 11:54 AM   Patient seen at [facility] during recent hospitalization. Pulmonary edema cleared and patient stable Recent cath at [facility] 12/2022 no critical CAD. 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

## 2023-03-08 ENCOUNTER — Encounter: Payer: Self-pay | Admitting: Cardiovascular Disease

## 2023-03-08 NOTE — Telephone Encounter (Signed)
    Primary Cardiologist: Charlton Haws, MD  Chart reviewed as part of pre-operative protocol coverage. Simple dental extractions are considered low risk procedures per guidelines and generally do not require any specific cardiac clearance. It is also generally accepted that for simple extractions and dental cleanings, there is no need to interrupt blood thinner therapy.   SBE prophylaxis is not  required for the patient.  I will route this recommendation to the requesting party via Epic fax function and remove from pre-op pool.  Please call with questions.  Ronney Asters, NP 03/08/2023, 9:11 AM

## 2023-03-08 NOTE — Telephone Encounter (Signed)
6/24 telephone encounter in regards to dental clearance was faxed to Dr Vincenza Hews [facility] office.  Patient aware that he is cleared to proceed with extractions.Patient is scheduled for extractions on 6/27 at 7:30 AM.

## 2023-03-08 NOTE — Telephone Encounter (Signed)
Donya, Tomaro B - 03/07/2023  8:21 AM Wendall Stade, MD [phone])  Sent: Tue March 08, 2023  8:43 AM  To: Ethelda Chick, RN; Iona Coach, RN         Message  Clear to have dental procedure before TAVR    I will fax clearance to dental office.

## 2023-03-09 ENCOUNTER — Ambulatory Visit: Payer: Medicare HMO | Admitting: Internal Medicine

## 2023-03-09 ENCOUNTER — Other Ambulatory Visit: Payer: Self-pay

## 2023-03-09 DIAGNOSIS — I351 Nonrheumatic aortic (valve) insufficiency: Secondary | ICD-10-CM

## 2023-03-13 ENCOUNTER — Other Ambulatory Visit: Payer: Self-pay

## 2023-03-13 ENCOUNTER — Emergency Department (HOSPITAL_COMMUNITY): Payer: Medicare HMO

## 2023-03-13 ENCOUNTER — Encounter (HOSPITAL_COMMUNITY): Payer: Self-pay

## 2023-03-13 ENCOUNTER — Inpatient Hospital Stay (HOSPITAL_COMMUNITY)
Admission: EM | Admit: 2023-03-13 | Discharge: 2023-03-16 | DRG: 266 | Disposition: A | Discharge status: Home / Self Care | Payer: Medicare HMO | Attending: Cardiovascular Disease | Admitting: Cardiovascular Disease

## 2023-03-13 DIAGNOSIS — Z825 Family history of asthma and other chronic lower respiratory diseases: Secondary | ICD-10-CM

## 2023-03-13 DIAGNOSIS — D649 Anemia, unspecified: Secondary | ICD-10-CM | POA: Diagnosis not present

## 2023-03-13 DIAGNOSIS — Z6825 Body mass index (BMI) 25.0-25.9, adult: Secondary | ICD-10-CM

## 2023-03-13 DIAGNOSIS — D509 Iron deficiency anemia, unspecified: Secondary | ICD-10-CM | POA: Diagnosis present

## 2023-03-13 DIAGNOSIS — I5033 Acute on chronic diastolic (congestive) heart failure: Secondary | ICD-10-CM | POA: Insufficient documentation

## 2023-03-13 DIAGNOSIS — N179 Acute kidney failure, unspecified: Secondary | ICD-10-CM | POA: Diagnosis not present

## 2023-03-13 DIAGNOSIS — Z87891 Personal history of nicotine dependence: Secondary | ICD-10-CM | POA: Diagnosis not present

## 2023-03-13 DIAGNOSIS — E860 Dehydration: Secondary | ICD-10-CM | POA: Diagnosis present

## 2023-03-13 DIAGNOSIS — K900 Celiac disease: Secondary | ICD-10-CM | POA: Diagnosis present

## 2023-03-13 DIAGNOSIS — J439 Emphysema, unspecified: Secondary | ICD-10-CM | POA: Diagnosis present

## 2023-03-13 DIAGNOSIS — Z952 Presence of prosthetic heart valve: Secondary | ICD-10-CM | POA: Diagnosis not present

## 2023-03-13 DIAGNOSIS — R531 Weakness: Secondary | ICD-10-CM

## 2023-03-13 DIAGNOSIS — Y713 Surgical instruments, materials and cardiovascular devices (including sutures) associated with adverse incidents: Secondary | ICD-10-CM | POA: Diagnosis present

## 2023-03-13 DIAGNOSIS — E119 Type 2 diabetes mellitus without complications: Secondary | ICD-10-CM | POA: Diagnosis present

## 2023-03-13 DIAGNOSIS — Z1152 Encounter for screening for COVID-19: Secondary | ICD-10-CM

## 2023-03-13 DIAGNOSIS — T82897A Other specified complication of cardiac prosthetic devices, implants and grafts, initial encounter: Secondary | ICD-10-CM | POA: Diagnosis not present

## 2023-03-13 DIAGNOSIS — I351 Nonrheumatic aortic (valve) insufficiency: Secondary | ICD-10-CM | POA: Diagnosis not present

## 2023-03-13 DIAGNOSIS — I5043 Acute on chronic combined systolic (congestive) and diastolic (congestive) heart failure: Secondary | ICD-10-CM | POA: Diagnosis present

## 2023-03-13 DIAGNOSIS — I959 Hypotension, unspecified: Secondary | ICD-10-CM | POA: Diagnosis not present

## 2023-03-13 DIAGNOSIS — Z006 Encounter for examination for normal comparison and control in clinical research program: Secondary | ICD-10-CM | POA: Diagnosis not present

## 2023-03-13 DIAGNOSIS — J9601 Acute respiratory failure with hypoxia: Secondary | ICD-10-CM | POA: Diagnosis present

## 2023-03-13 DIAGNOSIS — R55 Syncope and collapse: Principal | ICD-10-CM | POA: Insufficient documentation

## 2023-03-13 DIAGNOSIS — Z79899 Other long term (current) drug therapy: Secondary | ICD-10-CM

## 2023-03-13 DIAGNOSIS — E871 Hypo-osmolality and hyponatremia: Secondary | ICD-10-CM | POA: Diagnosis not present

## 2023-03-13 DIAGNOSIS — Z886 Allergy status to analgesic agent status: Secondary | ICD-10-CM

## 2023-03-13 DIAGNOSIS — I10 Essential (primary) hypertension: Secondary | ICD-10-CM | POA: Diagnosis not present

## 2023-03-13 DIAGNOSIS — I350 Nonrheumatic aortic (valve) stenosis: Secondary | ICD-10-CM | POA: Diagnosis not present

## 2023-03-13 DIAGNOSIS — I083 Combined rheumatic disorders of mitral, aortic and tricuspid valves: Secondary | ICD-10-CM | POA: Diagnosis present

## 2023-03-13 DIAGNOSIS — Z7982 Long term (current) use of aspirin: Secondary | ICD-10-CM

## 2023-03-13 DIAGNOSIS — I110 Hypertensive heart disease with heart failure: Secondary | ICD-10-CM | POA: Diagnosis not present

## 2023-03-13 DIAGNOSIS — Z888 Allergy status to other drugs, medicaments and biological substances status: Secondary | ICD-10-CM

## 2023-03-13 DIAGNOSIS — F411 Generalized anxiety disorder: Secondary | ICD-10-CM | POA: Diagnosis present

## 2023-03-13 DIAGNOSIS — G4733 Obstructive sleep apnea (adult) (pediatric): Secondary | ICD-10-CM | POA: Diagnosis present

## 2023-03-13 DIAGNOSIS — D6959 Other secondary thrombocytopenia: Secondary | ICD-10-CM | POA: Diagnosis present

## 2023-03-13 DIAGNOSIS — I2510 Atherosclerotic heart disease of native coronary artery without angina pectoris: Secondary | ICD-10-CM | POA: Diagnosis present

## 2023-03-13 DIAGNOSIS — G4710 Hypersomnia, unspecified: Secondary | ICD-10-CM | POA: Diagnosis present

## 2023-03-13 DIAGNOSIS — E785 Hyperlipidemia, unspecified: Secondary | ICD-10-CM | POA: Diagnosis present

## 2023-03-13 DIAGNOSIS — E441 Mild protein-calorie malnutrition: Secondary | ICD-10-CM | POA: Diagnosis not present

## 2023-03-13 DIAGNOSIS — Z8379 Family history of other diseases of the digestive system: Secondary | ICD-10-CM

## 2023-03-13 LAB — CBC
HCT: 31.2 % — ABNORMAL LOW (ref 39.0–52.0)
Hemoglobin: 10.5 g/dL — ABNORMAL LOW (ref 13.0–17.0)
MCH: 33.2 pg (ref 26.0–34.0)
MCHC: 33.7 g/dL (ref 30.0–36.0)
MCV: 98.7 fL (ref 80.0–100.0)
Platelets: 179 10*3/uL (ref 150–400)
RBC: 3.16 MIL/uL — ABNORMAL LOW (ref 4.22–5.81)
RDW: 12.7 % (ref 11.5–15.5)
WBC: 7.3 10*3/uL (ref 4.0–10.5)
nRBC: 0 % (ref 0.0–0.2)

## 2023-03-13 LAB — BASIC METABOLIC PANEL
Anion gap: 12 (ref 5–15)
BUN: 20 mg/dL (ref 8–23)
CO2: 25 mmol/L (ref 22–32)
Calcium: 8.7 mg/dL — ABNORMAL LOW (ref 8.9–10.3)
Chloride: 97 mmol/L — ABNORMAL LOW (ref 98–111)
Creatinine, Ser: 1.28 mg/dL — ABNORMAL HIGH (ref 0.61–1.24)
GFR, Estimated: 58 mL/min — ABNORMAL LOW (ref >60)
Glucose, Bld: 126 mg/dL — ABNORMAL HIGH (ref 70–99)
Potassium: 3.9 mmol/L (ref 3.5–5.1)
Sodium: 134 mmol/L — ABNORMAL LOW (ref 135–145)

## 2023-03-13 LAB — TROPONIN I (HIGH SENSITIVITY)
Troponin I (High Sensitivity): 19 ng/L — ABNORMAL HIGH (ref <18)
Troponin I (High Sensitivity): 22 ng/L — ABNORMAL HIGH (ref <18)

## 2023-03-13 MED ORDER — RANOLAZINE ER 500 MG PO TB12
1000.0000 mg | ORAL_TABLET | Freq: Two times a day (BID) | ORAL | Status: DC
  Administered 2023-03-13 – 2023-03-14 (×3): 1000 mg via ORAL
  Filled 2023-03-13 (×5): qty 2

## 2023-03-13 MED ORDER — ENOXAPARIN SODIUM 40 MG/0.4ML IJ SOSY
40.0000 mg | PREFILLED_SYRINGE | Freq: Every day | INTRAMUSCULAR | Status: DC
  Administered 2023-03-13: 40 mg via SUBCUTANEOUS
  Filled 2023-03-13: qty 0.4

## 2023-03-13 MED ORDER — CLONAZEPAM 0.5 MG PO TABS
0.5000 mg | ORAL_TABLET | ORAL | Status: DC
  Filled 2023-03-13: qty 1

## 2023-03-13 MED ORDER — ACETAMINOPHEN 325 MG PO TABS: 650.0000 mg | ORAL_TABLET | ORAL | Status: DC | PRN

## 2023-03-13 MED ORDER — ONDANSETRON HCL 4 MG/2ML IJ SOLN: 4.0000 mg | Freq: Four times a day (QID) | INTRAMUSCULAR | Status: DC | PRN

## 2023-03-13 MED ORDER — SODIUM CHLORIDE 0.9% FLUSH
3.0000 mL | Freq: Two times a day (BID) | INTRAVENOUS | Status: DC
  Administered 2023-03-13 – 2023-03-15 (×5): 3 mL via INTRAVENOUS

## 2023-03-13 MED ORDER — SODIUM CHLORIDE 0.9 % IV SOLN: 250.0000 mL | INTRAVENOUS | Status: DC | PRN

## 2023-03-13 MED ORDER — ISOSORBIDE MONONITRATE ER 30 MG PO TB24
15.0000 mg | ORAL_TABLET | Freq: Every day | ORAL | Status: DC
  Administered 2023-03-14: 15 mg via ORAL
  Filled 2023-03-13: qty 1

## 2023-03-13 MED ORDER — ASPIRIN 81 MG PO TBEC
81.0000 mg | DELAYED_RELEASE_TABLET | Freq: Every morning | ORAL | Status: DC
  Administered 2023-03-14: 81 mg via ORAL
  Filled 2023-03-13: qty 1

## 2023-03-13 MED ORDER — SODIUM CHLORIDE 0.9% FLUSH: 3.0000 mL | INTRAVENOUS | Status: DC | PRN

## 2023-03-13 MED ORDER — ATORVASTATIN CALCIUM 80 MG PO TABS
80.0000 mg | ORAL_TABLET | Freq: Every day | ORAL | Status: DC
  Administered 2023-03-13 – 2023-03-14 (×2): 80 mg via ORAL
  Filled 2023-03-13 (×2): qty 2

## 2023-03-13 MED ORDER — LOSARTAN POTASSIUM 50 MG PO TABS: 25.0000 mg | ORAL_TABLET | Freq: Every day | ORAL | Status: DC

## 2023-03-13 MED ORDER — LORATADINE 10 MG PO TABS: 10.0000 mg | ORAL_TABLET | Freq: Every day | ORAL | Status: DC

## 2023-03-13 MED ORDER — FUROSEMIDE 20 MG PO TABS
40.0000 mg | ORAL_TABLET | Freq: Every day | ORAL | Status: DC
  Administered 2023-03-13 – 2023-03-14 (×2): 40 mg via ORAL
  Filled 2023-03-13 (×2): qty 2

## 2023-03-13 MED ORDER — CLONAZEPAM 0.25 MG PO TBDP
0.2500 mg | ORAL_TABLET | ORAL | Status: DC
  Administered 2023-03-13 – 2023-03-16 (×13): 0.25 mg via ORAL
  Filled 2023-03-13 (×14): qty 1

## 2023-03-13 MED ORDER — HYDRALAZINE HCL 25 MG PO TABS
25.0000 mg | ORAL_TABLET | Freq: Two times a day (BID) | ORAL | Status: DC
  Administered 2023-03-14 (×2): 25 mg via ORAL
  Filled 2023-03-13 (×3): qty 1

## 2023-03-13 MED ORDER — METOPROLOL SUCCINATE ER 25 MG PO TB24
12.5000 mg | ORAL_TABLET | Freq: Every day | ORAL | Status: DC
  Administered 2023-03-13 – 2023-03-14 (×2): 12.5 mg via ORAL
  Filled 2023-03-13 (×2): qty 1

## 2023-03-13 NOTE — ED Triage Notes (Signed)
Pt c/o generalized weakness and near syncope this morning. Pt states has had weakness since May.

## 2023-03-13 NOTE — ED Provider Notes (Signed)
[place] EMERGENCY DEPARTMENT AT [facility] Provider Note   CSN: [id number] Arrival date & time: 03/13/23  1233     History  Chief Complaint  Patient presents with   Near Syncope    Randy Duncan is a 77 y.o. male.  HPI   77 year old male with past medical history of previous bioprosthetic valve replacement with recent admission and found to have severe AR/AI who was planned for TAVR this week presents to the emergency department with near syncopal episode.  Patient states that this morning he was standing and cooking breakfast when he felt very fatigued, short of breath and dizzy like he was going to pass out.  He was able to sit down and improved the symptoms but he has felt generally weak since this episode with intermittent episodes of shortness of breath.  He is planned for preadmit in 2 days for a planned TAVR with cardiothoracic surgery.  No active chest pain at this time.  Some medications were adjusted at his recent admission that he has been compliant with.  Home Medications Prior to Admission medications   Medication Sig Start Date End Date Taking? Authorizing Provider  aspirin EC 81 MG tablet Take 81 mg by mouth in the morning. Swallow whole.    [provider]  atorvastatin (LIPITOR) 80 MG tablet Take 80 mg by mouth at bedtime.    [provider]  Cholecalciferol (VITAMIN D3) 125 MCG (5000 UT) CAPS Take 5,000 Units by mouth in the morning.    [provider]  clonazePAM (KLONOPIN) 0.5 MG tablet Take 0.25 mg by mouth every 4 (four) hours. 05/10/13   [provider]  furosemide (LASIX) 40 MG tablet Take 1 tablet (40 mg total) by mouth daily. 03/06/23   Ghimire, Werner Lean, MD  guaiFENesin-dextromethorphan (ROBITUSSIN DM) 100-10 MG/5ML syrup Take 5 mLs by mouth every 4 (four) hours as needed for cough.    [provider]  hydrALAZINE (APRESOLINE) 25 MG tablet Take 25 mg by mouth in the morning and at bedtime. 02/17/23    [provider]  isosorbide mononitrate (IMDUR) 30 MG 24 hr tablet Take 0.5 tablets (15 mg total) by mouth daily. 03/06/23   Ghimire, Werner Lean, MD  metoprolol succinate (TOPROL-XL) 25 MG 24 hr tablet Take 12.5 mg by mouth at bedtime.    [provider]  ranolazine (RANEXA) 1000 MG SR tablet Take 1,000 mg by mouth in the morning and at bedtime.    [provider]  ZYRTEC ALLERGY 10 MG tablet Take 10 mg by mouth See admin instructions. Take 10 mg by mouth in the evening-bedtime, along with the 2nd daily dose of Hydralazine    [provider]      Allergies    Losartan potassium, Wheat, Naproxen, Amlodipine, Coreg [carvedilol], Hydralazine, Lisinopril, Bupropion, and Cephalexin    Review of Systems   Review of Systems  Constitutional:  Positive for diaphoresis. Negative for fever.  Respiratory:  Positive for shortness of breath. Negative for chest tightness.   Cardiovascular:  Negative for chest pain and leg swelling.  Gastrointestinal:  Negative for abdominal pain, diarrhea and vomiting.  Skin:  Negative for rash.  Neurological:  Positive for light-headedness. Negative for syncope and headaches.    Physical Exam Updated Vital Signs BP (!) 124/31   Pulse 68   Temp 97.7 F (36.5 C)   Resp 16   Ht 5\' 7"  (1.702 m)   Wt 73 kg   SpO2 99%  BMI 25.21 kg/m  Physical Exam Vitals and nursing note reviewed.  Constitutional:      General: He is not in acute distress.    Appearance: Normal appearance.  HENT:     Head: Normocephalic.     Mouth/Throat:     Mouth: Mucous membranes are moist.  Cardiovascular:     Rate and Rhythm: Normal rate.  Pulmonary:     Effort: Pulmonary effort is normal. No respiratory distress.     Comments: Scattered rales at bases Abdominal:     Palpations: Abdomen is soft.     Tenderness: There is no abdominal tenderness.  Skin:    General: Skin is warm.  Neurological:     Mental Status: He is alert and oriented to  person, place, and time. Mental status is at baseline.  Psychiatric:        Mood and Affect: Mood normal.     ED Results / Procedures / Treatments   Labs (all labs ordered are listed, but only abnormal results are displayed) Labs Reviewed  BASIC METABOLIC PANEL - Abnormal; Notable for the following components:      Result Value   Sodium 134 (*)    Chloride 97 (*)    Glucose, Bld 126 (*)    Creatinine, Ser 1.28 (*)    Calcium 8.7 (*)    GFR, Estimated 58 (*)    All other components within normal limits  CBC - Abnormal; Notable for the following components:   RBC 3.16 (*)    Hemoglobin 10.5 (*)    HCT 31.2 (*)    All other components within normal limits  TROPONIN I (HIGH SENSITIVITY) - Abnormal; Notable for the following components:   Troponin I (High Sensitivity) 19 (*)    All other components within normal limits  TROPONIN I (HIGH SENSITIVITY)    EKG EKG Interpretation Date/Time:  Sunday March 13 2023 12:46:04 EDT Ventricular Rate:  78 PR Interval:  202 QRS Duration:  92 QT Interval:  420 QTC Calculation: 478 R Axis:   34  Text Interpretation: Normal sinus rhythm Normal ECG When compared with ECG of 27-Feb-2023 19:05, PREVIOUS ECG IS PRESENT Similar to previous Confirmed by Coralee Pesa [id number]) on 03/13/2023 2:33:37 PM  Radiology DG Chest 2 View  Result Date: 03/13/2023 CLINICAL DATA:  Near syncope EXAM: CHEST - 2 VIEW COMPARISON:  Previous studies including the examination of 03/01/2023 FINDINGS: Transverse diameter of heart is increased. Small patchy infiltrates are seen in left parahilar region. There are no signs of alveolar pulmonary edema. There is minimal blunting of both lateral CP angles suggesting minimal bilateral pleural effusions with interval decrease. There is a prosthetic cardiac valve. Metallic sutures are seen in the sternum. IMPRESSION: Small patchy infiltrate is seen left perihilar region suggesting atelectasis/pneumonia. Possible minimal bilateral  pleural effusions. Electronically Signed   By: Ernie Avena M.D.   On: 03/13/2023 13:18    Procedures Procedures    Medications Ordered in ED Medications - No data to display  ED Course/ Medical Decision Making/ A&P                             Medical Decision Making Amount and/or Complexity of Data Reviewed Labs: ordered. Radiology: ordered.   77 year old male presents emergency department after a presyncopal episode with shortness of breath and weakness.  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  Recently discharged last week after a stay for  the above diagnosis.  He is planned for TAVR this week.  Currently complaining of generalized weakness and intermittent shortness of breath.  Vitals are stable on arrival.  EKG is unchanged.  Blood work is reassuring, troponin is slightly elevated at 19 but significantly down from his previous admission.  Chest x-ray is clear besides possible mild pleural effusions.  Given his known AR/AI in light of a presyncopal event I believe inpatient care is best.  Cardiologist Dr. Antoine Poche and cardiothoracic surgeon Dr. Dorris Fetch were made aware and we will plan for inpatient admission.  Patients evaluation and results requires admission for further treatment and care.  Spoke with hospitalist, reviewed patient's ED course and they accept admission.  Patient agrees with admission plan, offers no new complaints and is stable/unchanged at time of admit.        Final Clinical Impression(s) / ED Diagnoses Final diagnoses:  None    Rx / DC Orders ED Discharge Orders     None         Rozelle Logan, DO 03/13/23 1457

## 2023-03-13 NOTE — Progress Notes (Signed)
Will hold off losartan for borderline hypotension. D/W pharmacy

## 2023-03-13 NOTE — H&P (Signed)
History and Physical    Randy Duncan:[id number] DOB: 10/31/1945 DOA: 03/13/2023  PCP: Randy Harries, MD (Confirm with patient/family/NH records and if not entered, this has to be entered at [facility] point of entry) Patient coming from: Home  I have personally briefly reviewed patient's old medical records in [facility]  Chief Complaint: Feeling dizzy, tired  HPI: Randy Duncan is a 77 y.o. male with medical history significant of aortic stenosis status post bioprosthetic aortic valve repair in 2008, with recent finding of severe severe aortic regurgitation, CAD, HTN, HLD, came back for near syncope symptoms.  Patient was recently hospitalized for acute HFpEF decompensation and echocardiogram found patient has a new severe aortic regurgitation associated with the biosynthetic aortic valve repair was initially done in 2008.  Patient underwent diuresis and discharged home 8 days ago.  Initial plan is for the patient come back in 2 to 4 weeks for TAVR evaluation.  However, since then however patient has been having generalized weakness and exertional dyspnea and this morning, while standing, patient suddenly started to feel severe lightheadedness had to sit down to avoid fall associated with severe shortness of breath feeling.  Denies any chest pain, no cough no fever or chills no leg swelling. ED Course:  Blood pressure 120/43, pulse 76.  Chest x-ray no acute significant acute infiltrates.  Sodium 134 creatinine 1.28, BUN 20 potassium 3.9 bicarb 25  Cardiology and CT surgery contacted in the ED both agreed patient should stay in the hospital for emergency TAVR evaluation.  Review of Systems: As per HPI otherwise 14 point review of systems negative.   Past Medical History:  Diagnosis Date   Abdominal bloating    Celiac disease    Fatigue    Hypersomnia with sleep apnea, unspecified 06/22/2013   Hypoglycemia    Impaired fasting glucose    S/P aortic valve replacement with  bioprosthetic valve    Sleep disorder    Snoring     Past Surgical History:  Procedure Laterality Date   AORTIC VALVE REPAIR  11/2006   CATARACT EXTRACTION Bilateral 2010   TEE WITHOUT CARDIOVERSION N/A 03/04/2023   Procedure: TRANSESOPHAGEAL ECHOCARDIOGRAM;  Surgeon: Randy Constant, MD;  Location: [facility] INVASIVE CV LAB;  Service: Cardiovascular;  Laterality: N/A;   TUMOR EXCISION     Left parotid gland [place] [employer]     reports that he has quit smoking. His smoking use included cigarettes. He has a 40.00 pack-year smoking history. He has never used smokeless tobacco. He reports that he does not drink alcohol and does not use drugs.  Allergies  Allergen Reactions   Losartan Potassium Itching and Swelling   Wheat Rash and Other (See Comments)    Celiac disease   Naproxen Other (See Comments)    Sweats and stomach upset     Amlodipine    Coreg [Carvedilol]    Hydralazine Other (See Comments)    Cannot tolerate this at 3 times a day    Lisinopril    Bupropion Nausea Only and Other (See Comments)    Upset stomach   Cephalexin Nausea Only and Other (See Comments)    upset stomach    Family History  Problem Relation Age of Onset   Emphysema Maternal Grandfather    Celiac disease Daughter    Lung disease Neg Hx      Prior to Admission medications   Medication Sig Start Date End Date Taking? Authorizing Provider  amoxicillin (AMOXIL) 500 MG capsule Take  2,000 mg by mouth See admin instructions. Take 2,000mg  (4 capsules) by mouth 30 minutes prior to dental procedure.    [provider]  aspirin EC 81 MG tablet Take 81 mg by mouth in the morning. Swallow whole.    [provider]  atorvastatin (LIPITOR) 80 MG tablet Take 80 mg by mouth at bedtime.    [provider]  Cholecalciferol (VITAMIN D3) 125 MCG (5000 UT) CAPS Take 5,000 Units by mouth in the morning.    [provider]  clonazePAM (KLONOPIN) 0.5 MG tablet Take 0.25 mg by  mouth every 4 (four) hours. 05/10/13   [provider]  furosemide (LASIX) 40 MG tablet Take 1 tablet (40 mg total) by mouth daily. 03/06/23   Duncan, Randy Lean, MD  guaiFENesin-dextromethorphan (ROBITUSSIN DM) 100-10 MG/5ML syrup Take 5 mLs by mouth every 4 (four) hours as needed for cough.    [provider]  hydrALAZINE (APRESOLINE) 25 MG tablet Take 25 mg by mouth in the morning and at bedtime. 02/17/23   [provider]  isosorbide mononitrate (IMDUR) 30 MG 24 hr tablet Take 0.5 tablets (15 mg total) by mouth daily. 03/06/23   Duncan, Randy Lean, MD  losartan (COZAAR) 25 MG tablet Take 25 mg by mouth daily.    [provider]  metoprolol succinate (TOPROL-XL) 25 MG 24 hr tablet Take 12.5 mg by mouth at bedtime.    [provider]  ranolazine (RANEXA) 1000 MG SR tablet Take 1,000 mg by mouth in the morning and at bedtime.    [provider]  ZYRTEC ALLERGY 10 MG tablet Take 10 mg by mouth See admin instructions. Take 10 mg by mouth in the evening-bedtime, along with the 2nd daily dose of Hydralazine    [provider]    Physical Exam: Vitals:   03/13/23 1242 03/13/23 1243 03/13/23 1330  BP: (!) 152/43  (!) 124/31  Pulse: 76  68  Resp: 16  16  Temp: 97.7 F (36.5 C)    SpO2: 100%  99%  Weight:  73 kg   Height:  5\' 7"  (1.702 m)     Constitutional: NAD, calm, comfortable Vitals:   03/13/23 1242 03/13/23 1243 03/13/23 1330  BP: (!) 152/43  (!) 124/31  Pulse: 76  68  Resp: 16  16  Temp: 97.7 F (36.5 C)    SpO2: 100%  99%  Weight:  73 kg   Height:  5\' 7"  (1.702 m)    Eyes: PERRL, lids and conjunctivae normal ENMT: Mucous membranes are moist. Posterior pharynx clear of any exudate or lesions.Normal dentition.  Neck: normal, supple, no masses, no thyromegaly Respiratory: clear to auscultation bilaterally, no wheezing, no crackles. Normal respiratory effort. No accessory muscle use.  Cardiovascular: Regular rate and  rhythm, diastolic murmur on heart base. No extremity edema. 2+ pedal pulses. No carotid bruits.  Abdomen: no tenderness, no masses palpated. No hepatosplenomegaly. Bowel sounds positive.  Musculoskeletal: no clubbing / cyanosis. No joint deformity upper and lower extremities. Good ROM, no contractures. Normal muscle tone.  Skin: no rashes, lesions, ulcers. No induration Neurologic: CN 2-12 grossly intact. Sensation intact, DTR normal. Strength 5/5 in all 4.  Psychiatric: Normal judgment and insight. Alert and oriented x 3. Normal mood.     Labs on Admission: I have personally reviewed following labs and imaging studies  CBC: Recent Labs  Lab 03/13/23 1253  WBC 7.3  HGB 10.5*  HCT 31.2*  MCV 98.7  PLT 179  Basic Metabolic Panel: Recent Labs  Lab 03/13/23 1253  NA 134*  K 3.9  CL 97*  CO2 25  GLUCOSE 126*  BUN 20  CREATININE 1.28*  CALCIUM 8.7*   GFR: Estimated Creatinine Clearance: 45.2 mL/min (A) (by C-G formula based on SCr of 1.28 mg/dL (H)). Liver Function Tests: No results for input(s): "AST", "ALT", "ALKPHOS", "BILITOT", "PROT", "ALBUMIN" in the last 168 hours. No results for input(s): "LIPASE", "AMYLASE" in the last 168 hours. No results for input(s): "AMMONIA" in the last 168 hours. Coagulation Profile: No results for input(s): "INR", "PROTIME" in the last 168 hours. Cardiac Enzymes: No results for input(s): "CKTOTAL", "CKMB", "CKMBINDEX", "TROPONINI" in the last 168 hours. BNP (last 3 results) No results for input(s): "PROBNP" in the last 8760 hours. HbA1C: No results for input(s): "HGBA1C" in the last 72 hours. CBG: No results for input(s): "GLUCAP" in the last 168 hours. Lipid Profile: No results for input(s): "CHOL", "HDL", "LDLCALC", "TRIG", "CHOLHDL", "LDLDIRECT" in the last 72 hours. Thyroid Function Tests: No results for input(s): "TSH", "T4TOTAL", "FREET4", "T3FREE", "THYROIDAB" in the last 72 hours. Anemia Panel: No results for input(s):  "VITAMINB12", "FOLATE", "FERRITIN", "TIBC", "IRON", "RETICCTPCT" in the last 72 hours. Urine analysis:    Component Value Date/Time   COLORURINE COLORLESS (A) 02/27/2023 1252   APPEARANCEUR CLEAR 02/27/2023 1252   APPEARANCEUR Clear 06/20/2013 1326   LABSPEC 1.013 02/27/2023 1252   LABSPEC 1.005 06/20/2013 1326   PHURINE 5.0 02/27/2023 1252   GLUCOSEU NEGATIVE 02/27/2023 1252   GLUCOSEU Negative 06/20/2013 1326   HGBUR NEGATIVE 02/27/2023 1252   BILIRUBINUR NEGATIVE 02/27/2023 1252   BILIRUBINUR Negative 06/20/2013 1326   KETONESUR 5 (A) 02/27/2023 1252   PROTEINUR NEGATIVE 02/27/2023 1252   NITRITE NEGATIVE 02/27/2023 1252   LEUKOCYTESUR NEGATIVE 02/27/2023 1252   LEUKOCYTESUR Negative 06/20/2013 1326    Radiological Exams on Admission: DG Chest 2 View  Result Date: 03/13/2023 CLINICAL DATA:  Near syncope EXAM: CHEST - 2 VIEW COMPARISON:  Previous studies including the examination of 03/01/2023 FINDINGS: Transverse diameter of heart is increased. Small patchy infiltrates are seen in left parahilar region. There are no signs of alveolar pulmonary edema. There is minimal blunting of both lateral CP angles suggesting minimal bilateral pleural effusions with interval decrease. There is a prosthetic cardiac valve. Metallic sutures are seen in the sternum. IMPRESSION: Small patchy infiltrate is seen left perihilar region suggesting atelectasis/pneumonia. Possible minimal bilateral pleural effusions. Electronically Signed   By: Ernie Avena M.D.   On: 03/13/2023 13:18    EKG: Independently reviewed.  Sinus rhythm, no significant acute ST changes.  Assessment/Plan Principal Problem:   Near syncope Active Problems:   Acute respiratory failure with hypoxia (HCC)   Acute on chronic combined systolic and diastolic CHF (congestive heart failure) (HCC)  (please populate well all problems here in Problem List. (For example, if patient is on BP meds at home and you resume or decide to hold  them, it is a problem that needs to be her. Same for CAD, COPD, HLD and so on)  Near syncope Acute on chronic severe aortic regurgitation decompensation -Agreed with emergency TAVR evaluation -Volume status wise patient appears to be euvolemic, but will check orthostatic vital signs to adjust his CHF/BP meds. -TEE was just done on last admission, will not repeat at this point.  Chronic HFpEF -Euvolemic, continue Lasix  CAD -No acute concern, continue aspirin, statin and metoprolol  Chronic hyponatremia -Secondary to chronic HFpEF, sodium level stable, continue Lasix  HTN -  Able, continue metoprolol, hydralazine and Imdur  History of OSA -Unable to tolerate BiPAP    DVT prophylaxis: Lovenox Code Status: Full code Family Communication: Wife at bedside Disposition Plan: Patient is sick with decompensated severe aortic regurgitation, requiring emergency inpatient TAVR evaluation and possibly TAVR intervention during this admission, expect more than 2 midnight hospital stay. Consults called: Cardiology and CT surgery Admission status: Telemetry admission  Emeline General MD [facility] Pager [phone] 03/13/2023, 3:27 PM

## 2023-03-13 NOTE — Consult Note (Addendum)
Cardiology Consultation   Patient ID: LARSON RAITT MRN: [id number]; DOB: September 10, 1946  Admit date: 03/13/2023 Date of Consult: 03/13/2023  PCP:  Tracey Harries, MD   [facility] Providers Cardiologist:  Charlton Haws, MD        Patient Profile:   ISHRAQ WANLESS is a 77 y.o. 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 Chipper Herb.  History of Present Illness:   Mr. Dryden historically has followed at [facility] with above history but was recently seen by our team during admission earlier this month found to have severe progressive AI pending outpatient TAVR 7/2.   [facility] notes previously outline prior severe stenosis of D2 with unsuccessful PCI in 2020. Echo in 10/2022 had shown normal EF, G2DD, bioprosthetic AV with normal function, trace AI, mild pulm HTN, moderate MAC. He was recently seen in their clinic 01/2023 with decreasing stamina while playing golf. Nuclear stress test was ordered which was abnormal, prompting cardiac cath 02/04/2023 there reported to show 55% proximal diagonal lesion, 30% mid RCA lesion, otherwise no significant obstruction. He was found to have elevated left ventricular end-diastolic pressure with hypertension. First diagonal lesion which was thought to be 90% obstructed as per prior cardiac catheterization in 2020 was reduced to 50 to 60%. More recently he as admitted to [facility]16-6/22/24 with acute hypoxic respiratory failure felt due to HFpEF exacerbation. He also had fever (negative blood cultures) and SOB on admission. 2D echo showed normal EF but concern for severe AI and mild AS. Cardiac CT showed prolapse of non coronary cusp,  also concern for area of PVL or annular dehiscence near the base of the left cusp with calcified intervalvular fibrosa inferior to this. He had intact straight graft repair of the ascending thoracic aorta with no residual aneurysm. TEE was performed 03/04/23 with severe AI. He was discharged with plan for outpatient TAVR and review at valve conference. He had multiple dental extractions after discharge. The plan was for outpatient TAVR 03/15/23 but he returned to the ER feeling worse. He reported that while fixing breakfast he almost fell to the floor with dizziness and lightheadedness as well as worsening SOB. VSS. Labs show hyponatremia similar to prior and mild AKI with Cr 1.28, continued anemia with Hgb 10 range similar to recent values. EKG shows NSR. Internal medicine has admitted the patient and cardiology asked to see.   Past Medical History:  Diagnosis Date   Abdominal bloating    Celiac disease    Fatigue    Hypersomnia with sleep apnea, unspecified 06/22/2013   Hypoglycemia    Impaired fasting glucose    S/P aortic valve replacement with bioprosthetic valve    Sleep disorder    Snoring     Past Surgical History:  Procedure Laterality Date   AORTIC VALVE REPAIR  11/2006   CATARACT EXTRACTION Bilateral 2010   TEE WITHOUT CARDIOVERSION N/A 03/04/2023   Procedure: TRANSESOPHAGEAL ECHOCARDIOGRAM;  Surgeon: Christell Constant, MD;  Location: [facility] INVASIVE CV LAB;  Service: Cardiovascular;  Laterality: N/A;   TUMOR EXCISION     Left parotid gland ([place])     Home Medications:  Prior to Admission medications   Medication Sig Start Date End  Date Taking? Authorizing Provider  acetaminophen (TYLENOL) 500 MG tablet Take 1,000 mg by mouth as needed for mild pain or moderate pain.   Yes [provider]  amoxicillin (AMOXIL) 500 MG capsule Take 2,000 mg by mouth See admin instructions. Take 2,000mg  (4 capsules) by mouth 30 minutes prior to dental procedure.   Yes [provider]  aspirin EC 81 MG tablet Take 81 mg by mouth in the morning. Swallow whole.   Yes [provider]  atorvastatin (LIPITOR) 80 MG tablet Take 80 mg by mouth at bedtime.   Yes [provider]  Cholecalciferol (VITAMIN D3) 125 MCG (5000 UT) CAPS Take 5,000 Units by mouth in the morning.   Yes [provider]  clonazePAM (KLONOPIN) 0.5 MG tablet Take 0.25 mg by mouth every 4 (four) hours. 05/10/13  Yes [provider]  furosemide (LASIX) 40 MG tablet Take 1 tablet (40 mg total) by mouth daily. 03/06/23  Yes Ghimire, Werner Lean, MD  hydrALAZINE (APRESOLINE) 25 MG tablet Take 25 mg by mouth in the morning and at bedtime. 02/17/23  Yes [provider]  isosorbide mononitrate (IMDUR) 30 MG 24 hr tablet Take 0.5 tablets (15 mg total) by mouth daily. 03/06/23  Yes Ghimire, Werner Lean, MD  metoprolol succinate (TOPROL-XL) 25 MG 24 hr tablet Take 12.5 mg by mouth at bedtime.   Yes [provider]  ranolazine (RANEXA) 1000 MG SR tablet Take 1,000 mg by mouth in the morning and at bedtime.   Yes [provider]  ZYRTEC ALLERGY 10 MG tablet Take 10 mg by mouth See admin instructions. Take 10 mg by mouth in the evening-bedtime, along with the 2nd daily dose of Hydralazine   Yes [provider]    Inpatient Medications: Scheduled Meds:  [START ON 03/14/2023] aspirin EC  81 mg Oral q AM   atorvastatin  80 mg Oral QHS   clonazePAM  0.5 mg Oral Q4H   enoxaparin (LOVENOX) injection  40 mg Subcutaneous QHS   furosemide  40 mg Oral Daily   hydrALAZINE  25 mg Oral BID   isosorbide mononitrate  15 mg Oral Daily   loratadine  10 mg Oral Daily   losartan  25 mg Oral Daily   metoprolol succinate  12.5 mg Oral QHS   ranolazine  1,000 mg Oral BID   sodium chloride flush  3 mL Intravenous Q12H   Continuous Infusions:  sodium chloride     PRN Meds: sodium chloride, acetaminophen, ondansetron (ZOFRAN) IV, sodium chloride flush  Allergies:     Allergies  Allergen Reactions   Losartan Potassium Itching and Swelling   Wheat Rash and Other (See Comments)    Celiac disease   Naproxen Other (See Comments)    Sweats and stomach upset     Amlodipine    Coreg [Carvedilol]    Hydralazine Other (See Comments)    Cannot tolerate this at 3 times a day    Lisinopril    Bupropion Nausea Only and Other (See Comments)    Upset stomach   Cephalexin Nausea Only and Other (See Comments)    upset stomach    Social History:   Social History   Socioeconomic History   Marital status: Married    Spouse name: Harriett Sine   Number of children: 3   Years of education: 14   Highest education level: Not on file  Occupational History    Employer: [employer]  Tobacco Use   Smoking status: Former  Packs/day: 1.00    Years: 40.00    Additional pack years: 0.00    Total pack years: 40.00    Types: Cigarettes   Smokeless tobacco: Never   Tobacco comments:    quit 02/2012  Vaping Use   Vaping Use: Never used  Substance and Sexual Activity   Alcohol use: No    Comment: quit in 2006   Drug use: No   Sexual activity: Not on file  Other Topics Concern   Not on file  Social History Narrative   Patient is right handed, consumes 1 soda daily,resides with wife and 2 adult children.   Social Determinants of Health   Financial Resource Strain: Not on file  Food Insecurity: No Food Insecurity (03/02/2023)   Hunger Vital Sign    Worried About Running Out of Food in the Last Year: Never true    Ran Out of Food in the Last Year: Never true  Transportation Needs: No Transportation Needs (03/02/2023)   PRAPARE - Administrator, Civil Service (Medical): No    Lack of Transportation (Non-Medical): No  Physical Activity: Not on file  Stress: Not on file  Social Connections: Not on file  Intimate Partner Violence: Not At Risk (03/02/2023)   Humiliation, Afraid, Rape, and Kick questionnaire    Fear of Current or Ex-Partner: No     Emotionally Abused: No    Physically Abused: No    Sexually Abused: No    Family History:   Family History  Problem Relation Age of Onset   Emphysema Maternal Grandfather    Celiac disease Daughter    Lung disease Neg Hx      ROS:  Please see the history of present illness.  All other ROS reviewed and negative.     Physical Exam/Data:   Vitals:   03/13/23 1242 03/13/23 1243 03/13/23 1330  BP: (!) 152/43  (!) 124/31  Pulse: 76  68  Resp: 16  16  Temp: 97.7 F (36.5 C)    SpO2: 100%  99%  Weight:  73 kg   Height:  5\' 7"  (1.702 m)    No intake or output data in the 24 hours ending 03/13/23 1631    03/13/2023   12:43 PM 03/05/2023    7:06 AM 03/04/2023    4:09 AM  Last 3 Weights  Weight (lbs) 160 lb 15 oz 160 lb 15 oz 160 lb 11.5 oz  Weight (kg) 73 kg 73 kg 72.9 kg     Body mass index is 25.21 kg/m.  Exam per MD  EKG:  The EKG was personally reviewed and demonstrates:  NSR 78bpm no acute STT Changes  Relevant CV Studies: See EMR for multiple studies  Laboratory Data:  High Sensitivity Troponin:   Recent Labs  Lab 02/27/23 0920 02/27/23 1210 02/27/23 1949 02/27/23 2227 03/13/23 1253  TROPONINIHS 45* 50* 101* 91* 19*     Chemistry Recent Labs  Lab 03/13/23 1253  NA 134*  K 3.9  CL 97*  CO2 25  GLUCOSE 126*  BUN 20  CREATININE 1.28*  CALCIUM 8.7*  GFRNONAA 58*  ANIONGAP 12    No results for input(s): "PROT", "ALBUMIN", "AST", "ALT", "ALKPHOS", "BILITOT" in the last 168 hours. Lipids No results for input(s): "CHOL", "TRIG", "HDL", "LABVLDL", "LDLCALC", "CHOLHDL" in the last 168 hours.  Hematology Recent Labs  Lab 03/13/23 1253  WBC 7.3  RBC 3.16*  HGB 10.5*  HCT 31.2*  MCV 98.7  MCH 33.2  MCHC  33.7  RDW 12.7  PLT 179   Thyroid No results for input(s): "TSH", "FREET4" in the last 168 hours.  BNPNo results for input(s): "BNP", "PROBNP" in the last 168 hours.  DDimer No results for input(s): "DDIMER" in the last 168  hours.   Radiology/Studies:  DG Chest 2 View  Result Date: 03/13/2023 CLINICAL DATA:  Near syncope EXAM: CHEST - 2 VIEW COMPARISON:  Previous studies including the examination of 03/01/2023 FINDINGS: Transverse diameter of heart is increased. Small patchy infiltrates are seen in left parahilar region. There are no signs of alveolar pulmonary edema. There is minimal blunting of both lateral CP angles suggesting minimal bilateral pleural effusions with interval decrease. There is a prosthetic cardiac valve. Metallic sutures are seen in the sternum. IMPRESSION: Small patchy infiltrate is seen left perihilar region suggesting atelectasis/pneumonia. Possible minimal bilateral pleural effusions. Electronically Signed   By: Ernie Avena M.D.   On: 03/13/2023 13:18     Assessment and Plan:   1. Dizziness/SOB in the context of severe AI s/p prior bioprosthetic AVR 2. Ascending aortic aneurysm repair, thought to be stable by recent cardiac CT 3. Nonobstructive coronary artery disease by cath 01/2023, mild troponin elevation 4. Chronic HFpEF in setting of valvular disease 5. Mild anemia, similar to prior 6. Mild AKI 7. Hyponatremia, seen similarly in the past   Patient seen/examined by Dr. Antoine Poche. See MD attestation for further recommendations.   Risk Assessment/Risk Scores:        New York Heart Association (NYHA) Functional Class NYHA Class III   For questions or updates, please contact [facility] Please consult [email or web address] for contact info under    Signed, Laurann Montana, PA-C  03/13/2023 4:31 PM  History and all data above reviewed.  Patient examined.  I agree with the findings as above.  The patient had an episode of light headedness.  He denies chest pain or SOB.  No palpitations.  No syncope.  Scheduled for TAVR.  Of note there is a mention of infiltrates on CXR but he does not report fever, chills, cough.  The patient exam reveals COR:RRR, diastolic murmur  ,   Lungs: Clear  ,  Abd: Positive bowel sounds, no rebound no guarding, Ext No edema  .  All available labs, radiology testing, previous records reviewed. Agree with documented assessment and plan.   Aortic valve disease:  No acute intervention tonight.  Discussed with structural team.  For TAVR Tuesday.  Despite the CXR I he does not have symptoms consistent with an acute pulmonary process. He had a CXR suggestive of pneumonia previously as well.   Dizziness:  He is likely somewhat volume contracted with his diuresis.  Agree with holding Cozaar. No change in therapy.  No further cardiac work up.  Trop is non specific.  He had no obstructive CAD on cath and has had no chest pain.     Fayrene Fearing Sherisa Gilvin  5:19 PM  03/13/2023

## 2023-03-14 ENCOUNTER — Encounter (HOSPITAL_COMMUNITY): Admission: RE | Admit: 2023-03-14 | Payer: Medicare HMO | Source: Ambulatory Visit

## 2023-03-14 DIAGNOSIS — I351 Nonrheumatic aortic (valve) insufficiency: Secondary | ICD-10-CM

## 2023-03-14 DIAGNOSIS — E441 Mild protein-calorie malnutrition: Secondary | ICD-10-CM

## 2023-03-14 DIAGNOSIS — I2510 Atherosclerotic heart disease of native coronary artery without angina pectoris: Secondary | ICD-10-CM | POA: Diagnosis not present

## 2023-03-14 DIAGNOSIS — I10 Essential (primary) hypertension: Secondary | ICD-10-CM | POA: Diagnosis not present

## 2023-03-14 DIAGNOSIS — R55 Syncope and collapse: Secondary | ICD-10-CM | POA: Diagnosis not present

## 2023-03-14 DIAGNOSIS — N179 Acute kidney failure, unspecified: Secondary | ICD-10-CM | POA: Diagnosis not present

## 2023-03-14 LAB — BASIC METABOLIC PANEL
Anion gap: 9 (ref 5–15)
BUN: 19 mg/dL (ref 8–23)
CO2: 25 mmol/L (ref 22–32)
Calcium: 8.6 mg/dL — ABNORMAL LOW (ref 8.9–10.3)
Chloride: 100 mmol/L (ref 98–111)
Creatinine, Ser: 1.08 mg/dL (ref 0.61–1.24)
GFR, Estimated: >60 mL/min (ref >60)
Glucose, Bld: 100 mg/dL — ABNORMAL HIGH (ref 70–99)
Potassium: 3.8 mmol/L (ref 3.5–5.1)
Sodium: 134 mmol/L — ABNORMAL LOW (ref 135–145)

## 2023-03-14 LAB — SARS CORONAVIRUS 2 BY RT PCR: SARS Coronavirus 2 by RT PCR: NEGATIVE

## 2023-03-14 MED ORDER — HEPARIN 30,000 UNITS/1000 ML (OHS) CELLSAVER SOLUTION
Status: DC
  Filled 2023-03-14: qty 1000

## 2023-03-14 MED ORDER — MAGNESIUM SULFATE 50 % IJ SOLN
40.0000 meq | INTRAMUSCULAR | Status: DC
  Filled 2023-03-14: qty 9.85

## 2023-03-14 MED ORDER — AMOXICILLIN 500 MG PO CAPS
500.0000 mg | ORAL_CAPSULE | Freq: Three times a day (TID) | ORAL | Status: DC
  Administered 2023-03-14 – 2023-03-16 (×6): 500 mg via ORAL
  Filled 2023-03-14 (×7): qty 1

## 2023-03-14 MED ORDER — TRAZODONE HCL 50 MG PO TABS
25.0000 mg | ORAL_TABLET | Freq: Every evening | ORAL | Status: DC | PRN
  Administered 2023-03-14 – 2023-03-15 (×2): 25 mg via ORAL
  Filled 2023-03-14 (×2): qty 1

## 2023-03-14 MED ORDER — NOREPINEPHRINE 4 MG/250ML-% IV SOLN
0.0000 ug/min | INTRAVENOUS | Status: DC
  Filled 2023-03-14: qty 250

## 2023-03-14 MED ORDER — POTASSIUM CHLORIDE 2 MEQ/ML IV SOLN
80.0000 meq | INTRAVENOUS | Status: DC
  Filled 2023-03-14: qty 40

## 2023-03-14 MED ORDER — DEXMEDETOMIDINE HCL IN NACL 400 MCG/100ML IV SOLN
0.1000 ug/kg/h | INTRAVENOUS | Status: DC
  Filled 2023-03-14 (×2): qty 100

## 2023-03-14 MED ORDER — CHLORHEXIDINE GLUCONATE CLOTH 2 % EX PADS
6.0000 | MEDICATED_PAD | Freq: Once | CUTANEOUS | Status: AC
  Administered 2023-03-14: 6 via TOPICAL

## 2023-03-14 MED ORDER — CEFAZOLIN SODIUM-DEXTROSE 2-4 GM/100ML-% IV SOLN
2.0000 g | INTRAVENOUS | Status: DC
  Filled 2023-03-14: qty 100

## 2023-03-14 MED ORDER — CHLORHEXIDINE GLUCONATE CLOTH 2 % EX PADS
6.0000 | MEDICATED_PAD | Freq: Once | CUTANEOUS | Status: AC
  Administered 2023-03-15: 6 via TOPICAL

## 2023-03-14 NOTE — Hospital Course (Addendum)
Randy Duncan was admitted to the hospital with the working diagnosis of near syncope.  77 yo male with the past medical history of aortic stenosis, sp bioprosthetic valve repair 2008, now with aortic regurgitation. He also has coronary artery disease, hypertension, and dyslipidemia. Recent hospitalization 06/16 to 03/05/23 for heart failure exacerbation, he was found to have severe aortic insufficiency, with plans for TAVR 2 to 4 weeks after his discharge. At home patient continue to have exertional dyspnea and on the day of admission while standing had a near syncope episode that prompted him to come back to the ED. On his initial physical examination his blood pressure was 120/43, HR 76, RR 16 and 02 saturation 100%, lungs with no wheezing or rales, heart with S1 and S2 present and regular, positive diastolic murmur at the base, abdomen with no distention and no lower extremity edema.   Na 134, K 3,9 Cl 97 bicarbonate 25, glucose 126, bun 20 cr 1,28  High sensitive troponin 19 and 22  Wbc 7,3 hgb 10,5 plt 179   Chest radiograph with no cardiomegaly, small opacity at the left upper lobe, hilar region, no other infiltrates, with small bilateral pleural effusions. Sternotomy wires in place.   EKG 78 bpm, normal axis, normal intervals, sinus rhythm with left atrial enlargement, no significant ST segment or T wave changes.

## 2023-03-14 NOTE — Assessment & Plan Note (Signed)
Continue blood pressure control with isosorbide, hydralazine and metoprolol.

## 2023-03-14 NOTE — Progress Notes (Addendum)
[facility] V[facility]   MULTIDISCIPLINARY HEART VALVE TEAM  Patient Name: Randy Duncan Date of Encounter: 03/14/2023  Admit date: 03/13/2023  Primary Care Provider: Tracey Harries, MD [facility] Cardiologist: Charlton Haws, MD  [facility] Electrophysiologist:  None   Hospital Problem List     Principal Problem:   Near syncope Active Problems:   Anxiety state   Coronary artery disease involving native coronary artery of native heart without angina pectoris   Essential hypertension   History of aortic valve replacement with tissue graft   Hyponatremia   Mild protein malnutrition (HCC)   S/P AVR   Severe aortic regurgitation   Subjective   Very anxious. Very thankful for the reassurance that everything is okay and we will be able to proceed with valve surgery as planned.   Inpatient Medications    Scheduled Meds:  amoxicillin  500 mg Oral TID   aspirin EC  81 mg Oral q AM   atorvastatin  80 mg Oral QHS   clonazepam  0.25 mg Oral Q4H   enoxaparin (LOVENOX) injection  40 mg Subcutaneous QHS   hydrALAZINE  25 mg Oral BID   isosorbide mononitrate  15 mg Oral Daily   metoprolol succinate  12.5 mg Oral QHS   ranolazine  1,000 mg Oral BID   sodium chloride flush  3 mL Intravenous Q12H   Continuous Infusions:  sodium chloride     PRN Meds: sodium chloride, acetaminophen, ondansetron (ZOFRAN) IV, sodium chloride flush   Vital Signs    Vitals:   03/14/23 0012 03/14/23 0409 03/14/23 0500 03/14/23 0813  BP: (!) 113/45 (!) 99/38  (!) 124/35  Pulse: 65 64 63 66  Resp: 14 15 15 15   Temp: 97.9 F (36.6 C) 98.4 F (36.9 C)  98.3 F (36.8 C)  TempSrc: Oral Oral  Oral  SpO2: 97% 94% 95% 96%  Weight:   66.3 kg   Height:        Intake/Output Summary (Last 24 hours) at 03/14/2023 1039 Last data filed at 03/14/2023 0100 Gross per 24 hour  Intake 10 ml  Output 1000 ml  Net -990 ml   Filed Weights   03/13/23 1243 03/14/23 0500  Weight: 73 kg 66.3 kg     Physical Exam    GEN: Well nourished, well developed, in no acute distress.  HEENT: Grossly normal.  Neck: Supple, no JVD, carotid bruits, or masses. Cardiac: RRR, + diastolic murmur. No rubs, or gallops. No clubbing, cyanosis, edema.   Respiratory:  Respirations regular and unlabored, clear to auscultation bilaterally. GI: Soft, nontender, nondistended, BS + x 4. MS: no deformity or atrophy. Skin: warm and dry, no rash. Neuro:  Strength and sensation are intact. Psych: AAOx3.  Normal affect.  Labs    CBC Recent Labs    03/13/23 1253  WBC 7.3  HGB 10.5*  HCT 31.2*  MCV 98.7  PLT 179   Basic Metabolic Panel Recent Labs    16/10/96 1253 03/14/23 0120  NA 134* 134*  K 3.9 3.8  CL 97* 100  CO2 25 25  GLUCOSE 126* 100*  BUN 20 19  CREATININE 1.28* 1.08  CALCIUM 8.7* 8.6*   Liver Function Tests No results for input(s): "AST", "ALT", "ALKPHOS", "BILITOT", "PROT", "ALBUMIN" in the last 72 hours. No results for input(s): "LIPASE", "AMYLASE" in the last 72 hours. Cardiac Enzymes No results for input(s): "CKTOTAL", "CKMB", "CKMBINDEX", "TROPONINI" in the last 72 hours. BNP Invalid input(s): "POCBNP" D-Dimer No results for  input(s): "DDIMER" in the last 72 hours. Hemoglobin A1C No results for input(s): "HGBA1C" in the last 72 hours. Fasting Lipid Panel No results for input(s): "CHOL", "HDL", "LDLCALC", "TRIG", "CHOLHDL", "LDLDIRECT" in the last 72 hours. Thyroid Function Tests No results for input(s): "TSH", "T4TOTAL", "T3FREE", "THYROIDAB" in the last 72 hours.  Invalid input(s): "FREET3"  Telemetry    sinus - Personally Reviewed  ECG    Sinus HR  78 bpm- Personally Reviewed  Radiology    DG Chest 2 View  Result Date: 03/13/2023 CLINICAL DATA:  Near syncope EXAM: CHEST - 2 VIEW COMPARISON:  Previous studies including the examination of 03/01/2023 FINDINGS: Transverse diameter of heart is increased. Small patchy infiltrates are seen in left parahilar  region. There are no signs of alveolar pulmonary edema. There is minimal blunting of both lateral CP angles suggesting minimal bilateral pleural effusions with interval decrease. There is a prosthetic cardiac valve. Metallic sutures are seen in the sternum. IMPRESSION: Small patchy infiltrate is seen left perihilar region suggesting atelectasis/pneumonia. Possible minimal bilateral pleural effusions. Electronically Signed   By: Ernie Avena M.D.   On: 03/13/2023 13:18    Cardiac Studies   None this admission  Patient Profile     Randy Duncan is a 77 y.o. 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   Assessment & Plan    Near syncope: likely volume depleted with recent dental extractions with poor PO intake. Creat bumped c/w dehydration. Losartan held. Will hold Lasix as well.   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 Inpatient orders placed.   Chronic HFpEF: appears euvolemic. Losartan held given near syncope. Hold Lasix now.   CAD: cardiac catheterization on 02/04/23 with Dr. Ivan Croft which showed a 55% proximal diagonal lesion, 30% mid RCA lesion and otherwise no significant obstruction. Continue medical therapy.    Chronic hyponatremia: sodium level stable   HTN: BP stable.   Recent dental extractions: had 7 teeth extracted on 03/10/23. Will complete 1 week of amoxicillin as Rx'd by the dentist.   Signed, Cline Crock, PA-C  03/14/2023, 10:39 AM  Pager [phone]   I have personally seen and examined this patient. I agree with the assessment and plan as outlined above.  Doing well today. Most likely volume depleted following dental extractions and poor po intake. Losartan and lasix  on hold given mild worsening renal function. He is euvolemic on exam. 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   Verne Carrow 03/14/2023 10:59 AM

## 2023-03-14 NOTE — Assessment & Plan Note (Addendum)
Hyponatremia.   Renal function with serum cr at 1,0 with K at 3,8 and serum bicarbonate at 25. Na 134,   Plan to continue close monitoring of renal function and electrolytes.  Continue to hold on furosemide.

## 2023-03-14 NOTE — Progress Notes (Signed)
Heart Failure Navigator Progress Note  Assessed for Heart & Vascular TOC clinic readiness.  Patient does not meet criteria due to EF 60-65%, TAVR scheduled for 03/15/2023. No HF TOC. .   Navigator will sign off at this time.   Rhae Hammock, BSN, Scientist, clinical (histocompatibility and immunogenetics) Only

## 2023-03-14 NOTE — Assessment & Plan Note (Signed)
Chronic anemia, with hgb at 10,5  Close follow up as outpatient.

## 2023-03-14 NOTE — Progress Notes (Signed)
Progress Note   Patient: Randy Duncan ZOX:[id number] DOB: [birth year] DOA: 03/13/2023     1 DOS: the patient was seen and examined on 03/14/2023   Brief hospital course: Mr. Benedetti was admitted to the hospital with the working diagnosis of near syncope.  77 yo male with the past medical history of aortic stenosis, sp bioprosthetic valve repair 2008, now with aortic regurgitation. He also has coronary artery disease, hypertension, and dyslipidemia. Recent hospitalization 06/16 to 03/05/23 for heart failure exacerbation, he was found to have severe aortic insufficiency, with plans for TAVR 2 to 4 weeks after his discharge. At home patient continue to have exertional dyspnea and on the day of admission while standing had a near syncope episode that prompted him to come back to the ED. On his initial physical examination his blood pressure was 120/43, HR 76, RR 16 and 02 saturation 100%, lungs with no wheezing or rales, heart with S1 and S2 present and regular, positive diastolic murmur at the base, abdomen with no distention and no lower extremity edema.   Na 134, K 3,9 Cl 97 bicarbonate 25, glucose 126, bun 20 cr 1,28  High sensitive troponin 19 and 22  Wbc 7,3 hgb 10,5 plt 179   Chest radiograph with no cardiomegaly, small opacity at the left upper lobe, hilar region, no other infiltrates, with small bilateral pleural effusions. Sternotomy wires in place.   EKG 78 bpm, normal axis, normal intervals, sinus rhythm with left atrial enlargement, no significant ST segment or T wave changes.   Assessment and Plan: * Severe aortic regurgitation Echocardiogram with preserved LV systolic function EF 60 to 65%, RV systolic function preserved. RV with mild enlargement,  Left atrium with mild to moderate dilatation, RA with moderate dilatation, mild to moderate TR, severe aortic valve regurgitation.   Plan to continue with metoprolol and isosorbide. Plan for TAVR tomorrow.  Resume amoxicillin that was  prescribed post dental procedure.   Essential hypertension Continue blood pressure control with isosorbide, hydralazine and metoprolol.  Coronary artery disease involving native coronary artery of native heart without angina pectoris No chest pain. Continue with aspirin, statin and isosorbide. Patient on ranolazine.   AKI (acute kidney injury) (HCC) Hyponatremia.   Renal function with serum cr at 1,0 with K at 3,8 and serum bicarbonate at 25. Na 134,   Plan to continue close monitoring of renal function and electrolytes.  Continue to hold on furosemide.   Mild protein malnutrition (HCC) Continue nutritional supplements.   Chronic anemia Chronic anemia, with hgb at 10,5  Close follow up as outpatient.         Subjective: Patient is feeling better, no chest pain or dyspnea, no further dizziness or syncope episodes   Physical Exam: Vitals:   03/14/23 0409 03/14/23 0500 03/14/23 0813 03/14/23 1237  BP: (!) 99/38  (!) 124/35 (!) 115/33  Pulse: 64 63 66 72  Resp: 15 15 15 14   Temp: 98.4 F (36.9 C)  98.3 F (36.8 C) 97.7 F (36.5 C)  TempSrc: Oral  Oral Oral  SpO2: 94% 95% 96%   Weight:  66.3 kg    Height:       Neurology awake and alert ENT with mild pallor Cardiovascular with S1 and S2 present and regular, positive diastolic murmur at the base. No JVD No lower extremity edema Respiratory with no rales or wheezing, no rhonchi Abdomen with no distention  Data Reviewed:    Family Communication: no family at the bedside   Disposition:  Status is: Inpatient Remains inpatient appropriate because: plan for TAVR tomorrow   Planned Discharge Destination: Home     Author: Coralie Keens, MD 03/14/2023 1:28 PM  For on call review [email or web address].

## 2023-03-14 NOTE — Progress Notes (Signed)
Paged Dr. Orson Aloe cardiology on call for sleep med order and to inquire about preop orders for TAVR tomorrow.

## 2023-03-14 NOTE — Assessment & Plan Note (Signed)
No chest pain. Continue with aspirin, statin and isosorbide. Patient on ranolazine.

## 2023-03-14 NOTE — Assessment & Plan Note (Signed)
Echocardiogram with preserved LV systolic function EF 60 to 65%, RV systolic function preserved. RV with mild enlargement,  Left atrium with mild to moderate dilatation, RA with moderate dilatation, mild to moderate TR, severe aortic valve regurgitation.   Plan to continue with metoprolol and isosorbide. Plan for TAVR tomorrow.  Resume amoxicillin that was prescribed post dental procedure.

## 2023-03-14 NOTE — Progress Notes (Signed)
Dr. Orson Aloe returned call regarding TAVR orders. Gave orders for NPO at midnight and advised ok to order CHG for tonight and tomorrow morning. Also gave verbal order for Trazodone 25 mg po at bedtime for sleep. He stated he would leave consent and any other prep orders for MD in the morning.

## 2023-03-14 NOTE — Assessment & Plan Note (Signed)
Continue nutritional supplements. °

## 2023-03-15 ENCOUNTER — Inpatient Hospital Stay (HOSPITAL_COMMUNITY): Payer: Medicare HMO | Admitting: Anesthesiology

## 2023-03-15 ENCOUNTER — Inpatient Hospital Stay (HOSPITAL_COMMUNITY): Admission: RE | Admit: 2023-03-15 | Payer: Medicare HMO | Source: Ambulatory Visit | Admitting: Cardiovascular Disease

## 2023-03-15 ENCOUNTER — Inpatient Hospital Stay (HOSPITAL_COMMUNITY): Payer: Medicare HMO

## 2023-03-15 ENCOUNTER — Encounter (HOSPITAL_COMMUNITY): Payer: Self-pay | Admitting: Internal Medicine

## 2023-03-15 ENCOUNTER — Encounter (HOSPITAL_COMMUNITY)
Admission: EM | Disposition: A | Discharge status: Home / Self Care | Payer: Self-pay | Source: Home / Self Care | Attending: Internal Medicine

## 2023-03-15 ENCOUNTER — Other Ambulatory Visit: Payer: Self-pay | Admitting: Physician Assistant

## 2023-03-15 DIAGNOSIS — I350 Nonrheumatic aortic (valve) stenosis: Secondary | ICD-10-CM

## 2023-03-15 DIAGNOSIS — I351 Nonrheumatic aortic (valve) insufficiency: Secondary | ICD-10-CM

## 2023-03-15 DIAGNOSIS — Z006 Encounter for examination for normal comparison and control in clinical research program: Secondary | ICD-10-CM

## 2023-03-15 DIAGNOSIS — I10 Essential (primary) hypertension: Secondary | ICD-10-CM | POA: Diagnosis not present

## 2023-03-15 DIAGNOSIS — I110 Hypertensive heart disease with heart failure: Secondary | ICD-10-CM | POA: Diagnosis not present

## 2023-03-15 DIAGNOSIS — N179 Acute kidney failure, unspecified: Secondary | ICD-10-CM | POA: Diagnosis not present

## 2023-03-15 DIAGNOSIS — I5043 Acute on chronic combined systolic (congestive) and diastolic (congestive) heart failure: Secondary | ICD-10-CM | POA: Diagnosis not present

## 2023-03-15 DIAGNOSIS — I2510 Atherosclerotic heart disease of native coronary artery without angina pectoris: Secondary | ICD-10-CM

## 2023-03-15 DIAGNOSIS — Z1152 Encounter for screening for COVID-19: Secondary | ICD-10-CM | POA: Diagnosis not present

## 2023-03-15 DIAGNOSIS — Z87891 Personal history of nicotine dependence: Secondary | ICD-10-CM | POA: Diagnosis not present

## 2023-03-15 DIAGNOSIS — Z952 Presence of prosthetic heart valve: Secondary | ICD-10-CM

## 2023-03-15 DIAGNOSIS — D649 Anemia, unspecified: Secondary | ICD-10-CM

## 2023-03-15 HISTORY — PX: TRANSCATHETER AORTIC VALVE REPLACEMENT, TRANSFEMORAL: SHX6400

## 2023-03-15 HISTORY — PX: INTRAOPERATIVE TRANSTHORACIC ECHOCARDIOGRAM: SHX6523

## 2023-03-15 HISTORY — DX: Presence of prosthetic heart valve: Z95.2

## 2023-03-15 LAB — POCT I-STAT, CHEM 8
BUN: 22 mg/dL (ref 8–23)
Calcium, Ion: 1.18 mmol/L (ref 1.15–1.40)
Chloride: 102 mmol/L (ref 98–111)
Creatinine, Ser: 1 mg/dL (ref 0.61–1.24)
Glucose, Bld: 150 mg/dL — ABNORMAL HIGH (ref 70–99)
HCT: 26 % — ABNORMAL LOW (ref 39.0–52.0)
Hemoglobin: 8.8 g/dL — ABNORMAL LOW (ref 13.0–17.0)
Potassium: 4.7 mmol/L (ref 3.5–5.1)
Sodium: 137 mmol/L (ref 135–145)
TCO2: 29 mmol/L (ref 22–32)

## 2023-03-15 LAB — BASIC METABOLIC PANEL
Anion gap: 7 (ref 5–15)
BUN: 22 mg/dL (ref 8–23)
CO2: 26 mmol/L (ref 22–32)
Calcium: 8.7 mg/dL — ABNORMAL LOW (ref 8.9–10.3)
Chloride: 101 mmol/L (ref 98–111)
Creatinine, Ser: 1.08 mg/dL (ref 0.61–1.24)
GFR, Estimated: >60 mL/min (ref >60)
Glucose, Bld: 105 mg/dL — ABNORMAL HIGH (ref 70–99)
Potassium: 4.2 mmol/L (ref 3.5–5.1)
Sodium: 134 mmol/L — ABNORMAL LOW (ref 135–145)

## 2023-03-15 LAB — ECHOCARDIOGRAM LIMITED
AR max vel: 3.07 cm2
AV Area VTI: 2.97 cm2
AV Area mean vel: 3.27 cm2
AV Mean grad: 5 mmHg
AV Peak grad: 9.4 mmHg
Ao pk vel: 1.53 m/s
Calc EF: 59.1 %
Height: 67 in
Single Plane A2C EF: 55.4 %
Single Plane A4C EF: 63.1 %
Weight: 2321.6 oz

## 2023-03-15 LAB — TYPE AND SCREEN
ABO/RH(D): A POS
Antibody Screen: NEGATIVE

## 2023-03-15 LAB — ABO/RH: ABO/RH(D): A POS

## 2023-03-15 SURGERY — IMPLANTATION, AORTIC VALVE, TRANSCATHETER, FEMORAL APPROACH
Anesthesia: Monitor Anesthesia Care

## 2023-03-15 MED ORDER — TRAMADOL HCL 50 MG PO TABS: 50.0000 mg | ORAL_TABLET | ORAL | Status: DC | PRN

## 2023-03-15 MED ORDER — RANOLAZINE ER 500 MG PO TB12
1000.0000 mg | ORAL_TABLET | Freq: Two times a day (BID) | ORAL | Status: DC
  Administered 2023-03-16: 1000 mg via ORAL
  Filled 2023-03-15: qty 2

## 2023-03-15 MED ORDER — MORPHINE SULFATE (PF) 2 MG/ML IV SOLN: 1.0000 mg | INTRAVENOUS | Status: DC | PRN

## 2023-03-15 MED ORDER — SODIUM CHLORIDE 0.9% FLUSH
3.0000 mL | Freq: Two times a day (BID) | INTRAVENOUS | Status: DC
  Administered 2023-03-15: 3 mL via INTRAVENOUS

## 2023-03-15 MED ORDER — OXYCODONE HCL 5 MG PO TABS: 5.0000 mg | ORAL_TABLET | ORAL | Status: DC | PRN

## 2023-03-15 MED ORDER — LACTATED RINGERS IV SOLN: INTRAVENOUS | Status: DC

## 2023-03-15 MED ORDER — ALUM & MAG HYDROXIDE-SIMETH 200-200-20 MG/5ML PO SUSP
15.0000 mL | Freq: Four times a day (QID) | ORAL | Status: DC | PRN
  Administered 2023-03-15: 15 mL via ORAL
  Filled 2023-03-15: qty 30

## 2023-03-15 MED ORDER — ISOSORBIDE MONONITRATE ER 30 MG PO TB24
15.0000 mg | ORAL_TABLET | Freq: Every day | ORAL | Status: DC
  Administered 2023-03-16: 15 mg via ORAL
  Filled 2023-03-15: qty 1

## 2023-03-15 MED ORDER — CLOPIDOGREL BISULFATE 75 MG PO TABS: 75.0000 mg | ORAL_TABLET | Freq: Every day | ORAL | Status: DC

## 2023-03-15 MED ORDER — CEFAZOLIN SODIUM-DEXTROSE 2-3 GM-%(50ML) IV SOLR
INTRAVENOUS | Status: DC | PRN
  Administered 2023-03-15: 2 g via INTRAVENOUS

## 2023-03-15 MED ORDER — PROTAMINE SULFATE 10 MG/ML IV SOLN
INTRAVENOUS | Status: DC | PRN
  Administered 2023-03-15: 50 mg via INTRAVENOUS

## 2023-03-15 MED ORDER — ATORVASTATIN CALCIUM 80 MG PO TABS
80.0000 mg | ORAL_TABLET | Freq: Every day | ORAL | Status: DC
  Administered 2023-03-15: 80 mg via ORAL
  Filled 2023-03-15: qty 1

## 2023-03-15 MED ORDER — CLEVIDIPINE BUTYRATE 0.5 MG/ML IV EMUL
INTRAVENOUS | Status: DC | PRN
  Administered 2023-03-15: 4 mg/h via INTRAVENOUS

## 2023-03-15 MED ORDER — SODIUM CHLORIDE 0.9 % IV SOLN: 250.0000 mL | INTRAVENOUS | Status: DC | PRN

## 2023-03-15 MED ORDER — SODIUM CHLORIDE 0.9 % IV SOLN: 250.0000 mL | INTRAVENOUS | Status: DC

## 2023-03-15 MED ORDER — ASPIRIN 81 MG PO CHEW
81.0000 mg | CHEWABLE_TABLET | Freq: Every day | ORAL | Status: DC
  Administered 2023-03-16: 81 mg via ORAL
  Filled 2023-03-15: qty 1

## 2023-03-15 MED ORDER — NOREPINEPHRINE 4 MG/250ML-% IV SOLN
INTRAVENOUS | Status: DC | PRN
  Administered 2023-03-15: 2 ug/min via INTRAVENOUS

## 2023-03-15 MED ORDER — FUROSEMIDE 10 MG/ML IJ SOLN
40.0000 mg | Freq: Once | INTRAMUSCULAR | Status: AC
  Administered 2023-03-15: 40 mg via INTRAVENOUS
  Filled 2023-03-15: qty 4

## 2023-03-15 MED ORDER — HEPARIN SODIUM (PORCINE) 1000 UNIT/ML IJ SOLN
INTRAMUSCULAR | Status: DC | PRN
  Administered 2023-03-15: 10000 [IU] via INTRAVENOUS

## 2023-03-15 MED ORDER — FENTANYL CITRATE (PF) 100 MCG/2ML IJ SOLN
INTRAMUSCULAR | Status: DC | PRN
  Administered 2023-03-15: 100 ug via INTRAVENOUS

## 2023-03-15 MED ORDER — CEFAZOLIN SODIUM-DEXTROSE 2-4 GM/100ML-% IV SOLN
2.0000 g | Freq: Three times a day (TID) | INTRAVENOUS | Status: AC
  Administered 2023-03-15 (×2): 2 g via INTRAVENOUS
  Filled 2023-03-15 (×2): qty 100

## 2023-03-15 MED ORDER — ACETAMINOPHEN 325 MG PO TABS: 650.0000 mg | ORAL_TABLET | Freq: Four times a day (QID) | ORAL | Status: DC | PRN

## 2023-03-15 MED ORDER — ACETAMINOPHEN 650 MG RE SUPP: 650.0000 mg | Freq: Four times a day (QID) | RECTAL | Status: DC | PRN

## 2023-03-15 MED ORDER — PHENYLEPHRINE 80 MCG/ML (10ML) SYRINGE FOR IV PUSH (FOR BLOOD PRESSURE SUPPORT)
PREFILLED_SYRINGE | INTRAVENOUS | Status: DC | PRN
  Administered 2023-03-15: 160 ug via INTRAVENOUS

## 2023-03-15 MED ORDER — LIDOCAINE HCL (PF) 1 % IJ SOLN
INTRAMUSCULAR | Status: DC | PRN
  Administered 2023-03-15 (×2): 10 mL

## 2023-03-15 MED ORDER — MIDAZOLAM HCL 2 MG/2ML IJ SOLN
INTRAMUSCULAR | Status: DC | PRN
  Administered 2023-03-15: 2 mg via INTRAVENOUS

## 2023-03-15 MED ORDER — ONDANSETRON HCL 4 MG/2ML IJ SOLN: 4.0000 mg | Freq: Four times a day (QID) | INTRAMUSCULAR | Status: DC | PRN

## 2023-03-15 MED ORDER — DEXMEDETOMIDINE HCL IN NACL 400 MCG/100ML IV SOLN
INTRAVENOUS | Status: DC | PRN
  Administered 2023-03-15: 32.92 ug via INTRAVENOUS

## 2023-03-15 MED ORDER — METOPROLOL SUCCINATE ER 25 MG PO TB24
12.5000 mg | ORAL_TABLET | Freq: Every day | ORAL | Status: DC
  Administered 2023-03-15: 12.5 mg via ORAL
  Filled 2023-03-15: qty 1

## 2023-03-15 MED ORDER — SODIUM CHLORIDE 0.9% FLUSH: 3.0000 mL | INTRAVENOUS | Status: DC | PRN

## 2023-03-15 MED ORDER — NITROGLYCERIN IN D5W 200-5 MCG/ML-% IV SOLN: 0.0000 ug/min | INTRAVENOUS | Status: DC

## 2023-03-15 MED ORDER — EPHEDRINE SULFATE-NACL 50-0.9 MG/10ML-% IV SOSY
PREFILLED_SYRINGE | INTRAVENOUS | Status: DC | PRN
  Administered 2023-03-15: 10 mg via INTRAVENOUS

## 2023-03-15 MED ORDER — SODIUM CHLORIDE 0.9 % IV SOLN: INTRAVENOUS | Status: AC

## 2023-03-15 MED ORDER — CHLORHEXIDINE GLUCONATE 0.12 % MT SOLN
OROMUCOSAL | Status: AC
  Filled 2023-03-15: qty 15

## 2023-03-15 MED ORDER — HEPARIN (PORCINE) IN NACL 1000-0.9 UT/500ML-% IV SOLN
INTRAVENOUS | Status: DC | PRN
  Administered 2023-03-15 (×3): 500 mL

## 2023-03-15 MED ORDER — HYDRALAZINE HCL 25 MG PO TABS
25.0000 mg | ORAL_TABLET | Freq: Two times a day (BID) | ORAL | Status: DC
  Administered 2023-03-16: 25 mg via ORAL
  Filled 2023-03-15: qty 1

## 2023-03-15 SURGICAL SUPPLY — 34 items
BAG SNAP BAND KOVER 36X36 (MISCELLANEOUS) ×2 IMPLANT
BALLN TRUE 22X4.5 (BALLOONS) ×1
BALLOON TRUE 22X4.5 (BALLOONS) IMPLANT
CABLE ADAPT PACING TEMP 12FT (ADAPTER) IMPLANT
CATH 23 ULTRA DELIVERY (CATHETERS) IMPLANT
CATH DIAG 6FR PIGTAIL ANGLED (CATHETERS) IMPLANT
CATH INFINITI 5FR ANG PIGTAIL (CATHETERS) IMPLANT
CATH INFINITI 6F AL2 (CATHETERS) IMPLANT
CATH S G BIP PACING (CATHETERS) IMPLANT
CLOSURE MYNX CONTROL 6F/7F (Vascular Products) IMPLANT
CLOSURE PERCLOSE PROSTYLE (VASCULAR PRODUCTS) IMPLANT
CRIMPER (MISCELLANEOUS) IMPLANT
DEVICE INFLATION ATRION QL2530 (MISCELLANEOUS) IMPLANT
ELECT DEFIB PAD ADLT CADENCE (PAD) IMPLANT
KIT HEART LEFT (KITS) ×1 IMPLANT
KIT MICROPUNCTURE NIT STIFF (SHEATH) IMPLANT
KIT SAPIAN 3 ULTRA RESILIA 23 (Valve) IMPLANT
PACK CARDIAC CATHETERIZATION (CUSTOM PROCEDURE TRAY) ×1 IMPLANT
SHEATH BRITE TIP 7FR 35CM (SHEATH) IMPLANT
SHEATH INTRODUCER SET 20-26 (SHEATH) IMPLANT
SHEATH PINNACLE 6F 10CM (SHEATH) IMPLANT
SHEATH PINNACLE 8F 10CM (SHEATH) IMPLANT
SHEATH PROBE COVER 6X72 (BAG) IMPLANT
SLEEVE REPOSITIONING LENGTH 30 (MISCELLANEOUS) IMPLANT
STOPCOCK MORSE 400PSI 3WAY (MISCELLANEOUS) ×2 IMPLANT
TRANSDUCER W/STOPCOCK (MISCELLANEOUS) ×2 IMPLANT
TUBING ART PRESS 72 MALE/FEM (TUBING) IMPLANT
TUBING CIL FLEX 10 FLL-RA (TUBING) IMPLANT
TUBING CONTRAST HIGH PRESS 48 (TUBING) IMPLANT
WIRE AMPLATZ SS-J .035X180CM (WIRE) IMPLANT
WIRE EMERALD 3MM-J .035X150CM (WIRE) IMPLANT
WIRE EMERALD 3MM-J .035X260CM (WIRE) IMPLANT
WIRE EMERALD ST .035X260CM (WIRE) IMPLANT
WIRE SAFARI SM CURVE 275 (WIRE) IMPLANT

## 2023-03-15 NOTE — Op Note (Signed)
HEART AND VASCULAR CENTER   MULTIDISCIPLINARY HEART VALVE TEAM   TAVR OPERATIVE NOTE   Date of Procedure:  03/15/2023  Preoperative Diagnosis: Severe bioprosthetic aortic valve insufficiency  Postoperative Diagnosis: Same   Procedure:   Valve in valve transcatheter Aortic Valve Replacement - Percutaneous Right Transfemoral Approach  Edwards Sapien 3 Ultra Resilia THV (size 23 mm, model # 9755RSL,  serial # [id number])   Co-Surgeons:  Alleen Borne, MD and Verne Carrow, MD   Anesthesiologist:  G. Stoltzfus, DO  Echocardiographer:  Merlyn Lot, MD  Pre-operative Echo Findings: Severe prosthetic aortic insufficiency Normal left ventricular systolic function  Post-operative Echo Findings: No paravalvular leak Normal left ventricular systolic function   BRIEF CLINICAL NOTE AND INDICATIONS FOR SURGERY  77 y.o. 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    During the course of the patient's preoperative work up they have been evaluated comprehensively by a multidisciplinary team of specialists coordinated through the Multidisciplinary Heart Valve Clinic in the [facility].  They have been demonstrated to suffer from symptomatic severe AI as noted above. The patient has been counseled extensively as to the relative risks and benefits of all options for the treatment of severe aortic insufficiency including long term medical therapy, conventional surgery for aortic valve replacement, and transcatheter aortic valve replacement.  The patient has been independently evaluated by Dr. Leafy Ro with CT surgery and they are felt to be at high risk for conventional surgical aortic valve replacement. The surgeon indicated the patient would be a poor candidate for conventional surgery. Based upon review of all of  the patient's preoperative diagnostic tests they are felt to be candidate for transcatheter aortic valve replacement using the transfemoral approach as an alternative to high risk conventional surgery.     Following the decision to proceed with transcatheter aortic valve replacement, a discussion has been held regarding what types of management strategies would be attempted intraoperatively in the event of life-threatening complications, including whether or not the patient would be considered a candidate for the use of cardiopulmonary bypass and/or conversion to open sternotomy for attempted surgical intervention.  The patient has been advised of a variety of complications that might develop peculiar to this approach including but not limited to risks of death, stroke, paravalvular leak, aortic dissection or other major vascular complications, aortic annulus rupture, device embolization, cardiac rupture or perforation, acute myocardial infarction, arrhythmia, heart block or bradycardia requiring permanent pacemaker placement, congestive heart failure, respiratory failure, renal failure, pneumonia, infection, other late complications related to structural valve deterioration or migration, or other complications that might ultimately cause a temporary or permanent loss of functional independence or other long term morbidity.  The patient provides full informed consent for the procedure as described and all questions were answered preoperatively.    DETAILS OF THE OPERATIVE PROCEDURE  PREPARATION:    The patient was brought to the operating room on the above mentioned date and appropriate monitoring was established by the anesthesia team. The patient was placed in the supine position on the operating table.  Intravenous antibiotics were administered. The patient was monitored closely throughout the procedure under conscious sedation.   Baseline transthoracic echocardiogram was performed. [name]  abdomen and both groins were prepped and draped in a sterile manner. A time out procedure was performed.   PERIPHERAL ACCESS:  Using the modified Seldinger technique, femoral arterial and venous access was obtained with placement of 6 Fr sheaths on the left side.  A temporary transvenous pacemaker catheter was passed through the left femoral venous sheath under fluoroscopic guidance into the right ventricle.  The pacemaker was tested to ensure stable lead placement and pacemaker capture. Aortic root angiography was performed in order to determine the optimal angiographic angle for valve deployment.   TRANSFEMORAL ACCESS:   Percutaneous transfemoral access and sheath placement was performed using ultrasound guidance.  The right common femoral artery was cannulated using a micropuncture needle and appropriate location was verified using hand injection angiogram.  A pair of Abbott Perclose percutaneous closure devices were placed and a 6 French sheath replaced into the femoral artery.  The patient was heparinized systemically and ACT verified > 250 seconds.    A 14 Fr transfemoral E-sheath was introduced into the right common femoral artery after progressively dilating over an Amplatz superstiff wire. An AL-2 catheter was used to direct a straight-tip exchange length wire across the native aortic valve into the left ventricle. This was exchanged out for a pigtail catheter and position was confirmed in the LV apex. Simultaneous LV and Ao pressures were recorded.  The pigtail catheter was exchanged for a Safari wire in the LV apex.   BALLOON AORTIC VALVULOPLASTY:   Not performed   TRANSCATHETER HEART VALVE DEPLOYMENT:   An Edwards Sapien 3 Ultra transcatheter heart valve (size 23 mm) was prepared and crimped per manufacturer's guidelines, and the proper orientation of the valve is confirmed on the [employer] delivery system. The valve was advanced through the introducer sheath using normal  technique until in an appropriate position in the abdominal aorta beyond the sheath tip. The balloon was then retracted and using the fine-tuning wheel was centered on the valve. The valve was then advanced across the aortic arch using appropriate flexion of the catheter. The valve was carefully positioned across the aortic valve annulus. The Commander catheter was retracted using normal technique. Once final position of the valve has been confirmed by angiographic assessment, the valve is deployed during rapid ventricular pacing to maintain systolic blood pressure < 50 mmHg and pulse pressure < 10 mmHg. The balloon inflation is held for >3 seconds after reaching full deployment volume.   The a 22 mm True balloon was used to fracture the surgical valve ring while rapid pacing. There was clear fracture and enlargement of the valve ring.   Once the balloon has fully deflated the balloon is retracted into the ascending aorta and valve function is assessed using echocardiography. There is felt to be no paravalvular leak and no central aortic insufficiency.  Mean gradient was 5 mm Hg. [name] hemodynamic recovery following valve deployment is good.  The deployment balloon and guidewire are both removed.    PROCEDURE COMPLETION:   The sheath was removed and femoral artery closure performed.  Protamine was administered once femoral arterial repair was complete. The temporary pacemaker, pigtail catheter and femoral sheaths were removed with manual pressure used for venous hemostasis.  A Mynx femoral closure device was utilized following removal of the diagnostic sheath in the left femoral artery.  The patient tolerated the procedure well and is transported to the cath lab recovery area in stable condition. There were no immediate intraoperative complications. All sponge instrument and needle counts are verified correct at completion of the operation.   No blood products were administered during the  operation.  The patient  received a total of 0 mL of intravenous contrast during the procedure.   Alleen Borne, MD 03/15/2023 12:59 PM

## 2023-03-15 NOTE — Anesthesia Postprocedure Evaluation (Signed)
Anesthesia Post Note  Patient: RAJVIR BYRDSONG  Procedure(s) Performed: Transcatheter Aortic Valve Replacement, Transfemoral INTRAOPERATIVE TRANSTHORACIC ECHOCARDIOGRAM     Patient location during evaluation: PACU Anesthesia Type: MAC Level of consciousness: awake and alert Pain management: pain level controlled Vital Signs Assessment: post-procedure vital signs reviewed and stable Respiratory status: spontaneous breathing, nonlabored ventilation, respiratory function stable and patient connected to nasal cannula oxygen Cardiovascular status: stable and blood pressure returned to baseline Postop Assessment: no apparent nausea or vomiting Anesthetic complications: no   There were no known notable events for this encounter.  Last Vitals:  Vitals:   03/15/23 1225 03/15/23 1248  BP: (!) 96/46 (!) 100/53  Pulse: (!) 50 (!) 54  Resp: 12 12  Temp:    SpO2: 92% 94%    Last Pain:  Vitals:   03/15/23 1138  TempSrc: Tympanic  PainSc: 0-No pain                 Earl Lites P Aayra Hornbaker

## 2023-03-15 NOTE — Anesthesia Postprocedure Evaluation (Signed)
Anesthesia Post Note  Patient: Randy Duncan  Procedure(s) Performed: Transcatheter Aortic Valve Replacement, Transfemoral INTRAOPERATIVE TRANSTHORACIC ECHOCARDIOGRAM     Patient location during evaluation: PACU Anesthesia Type: MAC Level of consciousness: awake and alert Pain management: pain level controlled Vital Signs Assessment: post-procedure vital signs reviewed and stable Respiratory status: spontaneous breathing, nonlabored ventilation, respiratory function stable and patient connected to nasal cannula oxygen Cardiovascular status: stable and blood pressure returned to baseline Postop Assessment: no apparent nausea or vomiting Anesthetic complications: no   There were no known notable events for this encounter.  Last Vitals:  Vitals:   03/15/23 1225 03/15/23 1248  BP: (!) 96/46 (!) 100/53  Pulse: (!) 50 (!) 54  Resp: 12 12  Temp:    SpO2: 92% 94%    Last Pain:  Vitals:   03/15/23 1138  TempSrc: Tympanic  PainSc: 0-No pain                 Lockie Bothun P Dreux Mcgroarty      

## 2023-03-15 NOTE — Plan of Care (Signed)

## 2023-03-15 NOTE — CV Procedure (Addendum)
[facility]  TAVR OPERATIVE NOTE   Date of Procedure:  03/15/2023  Preoperative Diagnosis: Severe Aortic Insufficiency (Bioprosthetic valve failure)  Postoperative Diagnosis: Same   Procedure:   Transcatheter Aortic Valve Replacement - Transfemoral Approach  Edwards Sapien 3 THV (size 23 mm, model # [id number], serial # [id number])   Co-Surgeons:  Verne Carrow, MD and Evelene Croon , MD   Anesthesiologist:  Stoltzfus  Echocardiographer:  Izora Ribas  Pre-operative Echo Findings: Severe aortic stenosis Normal left ventricular systolic function  Post-operative Echo Findings: No paravalvular leak Normal left ventricular systolic function  BRIEF CLINICAL NOTE AND INDICATIONS FOR SURGERY  77 y.o. 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   During the course of the patient's preoperative work up they have been evaluated comprehensively by a multidisciplinary team of specialists coordinated through the Multidisciplinary [facility]ve [facility] in the [facility].  They have been demonstrated to suffer from symptomatic severe AI as noted above. The patient has been counseled extensively as to the relative risks and benefits of all options for the treatment of severe aortic insufficiency including long term medical therapy, conventional surgery for aortic valve replacement, and transcatheter aortic valve replacement.  The patient has been independently evaluated by Dr. Leafy Ro with CT surgery and they are felt to be at high risk for conventional surgical aortic valve replacement. The surgeon indicated the patient would be a poor candidate for conventional surgery. Based upon review of all of the patient's preoperative diagnostic tests they are felt to be candidate for transcatheter aortic valve  replacement using the transfemoral approach as an alternative to high risk conventional surgery.    Following the decision to proceed with transcatheter aortic valve replacement, a discussion has been held regarding what types of management strategies would be attempted intraoperatively in the event of life-threatening complications, including whether or not the patient would be considered a candidate for the use of cardiopulmonary bypass and/or conversion to open sternotomy for attempted surgical intervention.  The patient has been advised of a variety of complications that might develop peculiar to this approach including but not limited to risks of death, stroke, paravalvular leak, aortic dissection or other major vascular complications, aortic annulus rupture, device embolization, cardiac rupture or perforation, acute myocardial infarction, arrhythmia, heart block or bradycardia requiring permanent pacemaker placement, congestive heart failure, respiratory failure, renal failure, pneumonia, infection, other late complications related to structural valve deterioration or migration, or other complications that might ultimately cause a temporary or permanent loss of functional independence or other long term morbidity.  The patient provides full informed consent for the procedure as described and all questions were answered preoperatively.    DETAILS OF THE OPERATIVE PROCEDURE  PREPARATION:   The patient is brought to the operating room on the above mentioned date and central monitoring was established by the anesthesia team including placement of a radial arterial line. The patient is placed in the supine position on the operating table.  Intravenous antibiotics are administered. Conscious sedation is used.   Baseline transthoracic echocardiogram was performed. [name] chest, abdomen, both groins, and both lower extremities are prepared and draped in a sterile manner. A time out procedure is  performed.   PERIPHERAL ACCESS:   Using the modified Seldinger technique, femoral arterial and venous access were obtained with placement of a 6 Fr sheath in  the artery and a 7 Fr sheath in the vein on the left side using u/s guidance. A temporary transvenous pacemaker catheter was passed through the femoral venous sheath under fluoroscopic guidance into the right ventricle.  The pacemaker was tested to ensure stable lead placement and pacemaker capture. Angle for valve deployment was saved.   TRANSFEMORAL ACCESS:  A micropuncture kit was used to gain access to the right femoral artery using u/s guidance. Position confirmed with angiography. Pre-closure with double ProGlide closure devices. The patient was heparinized systemically and ACT verified > 250 seconds.    A 14 Fr transfemoral E-sheath was introduced into the right femoral artery after progressively dilating over an Amplatz superstiff wire. An AL-2 catheter was used to direct a straight-tip exchange length wire across the native aortic valve into the left ventricle. This was exchanged out for a pigtail catheter and position was confirmed in the LV apex. Simultaneous LV and Ao pressures were recorded.  The pigtail catheter was then exchanged for a Safari wire in the LV apex.   TRANSCATHETER HEART VALVE DEPLOYMENT:  An Edwards Sapien 3 THV (size 23 mm) was prepared and crimped per manufacturer's guidelines, and the proper orientation of the valve is confirmed on the [employer] delivery system. The valve was advanced through the introducer sheath using normal technique until in an appropriate position in the abdominal aorta beyond the sheath tip. The balloon was then retracted and using the fine-tuning wheel was centered on the valve. The valve was then advanced across the aortic arch using appropriate flexion of the catheter. The valve was carefully positioned across the aortic valve annulus. The Commander catheter was retracted using  normal technique. Once final position of the valve has been confirmed by angiographic assessment, the valve is deployed while temporarily holding ventilation and during rapid ventricular pacing to maintain systolic blood pressure < 50 mmHg and pulse pressure < 10 mmHg. The balloon inflation is held for >3 seconds after reaching full deployment volume. Once the balloon has fully deflated the balloon is retracted into the ascending aorta. A 22 mm True balloon was then advanced into the newly placed valve and was inflated to perform "fracturing" of the surgical valve while rapid pacing. Valve function is assessed using TTE. There is felt to be no paravalvular leak and no central aortic insufficiency.  [name] hemodynamic recovery following valve deployment is good.  The deployment balloon and guidewire are both removed. Echo demostrated acceptable post-procedural gradients, stable mitral valve function, and no AI.   PROCEDURE COMPLETION:  The sheath was then removed and closure devices were completed. Protamine was administered once femoral arterial repair was complete. The temporary pacemaker and femoral sheaths were removed with a Mynx closure device placed in the artery and manual pressure used for venous hemostasis.    The patient tolerated the procedure well and is transported to the surgical intensive care in stable condition. There were no immediate intraoperative complications. All sponge instrument and needle counts are verified correct at completion of the operation.   No blood products were administered during the operation.  The patient received no contrast.   LVEDP: 28 mm Hg  Verne Carrow MD, [facility] 03/15/2023 11:43 AM

## 2023-03-15 NOTE — Progress Notes (Signed)
Mobility Specialist Progress Note:   03/15/23 1600  Mobility  Activity Ambulated with assistance in hallway  Level of Assistance Contact guard assist, steadying assist  Assistive Device Front wheel walker  Distance Ambulated (ft) 240 ft  Activity Response Tolerated well  Mobility Referral Yes  $Mobility charge 1 Mobility  Mobility Specialist Start Time (ACUTE ONLY) 1615  Mobility Specialist Stop Time (ACUTE ONLY) 1635  Mobility Specialist Time Calculation (min) (ACUTE ONLY) 20 min    Pre Mobility: 54 HR,  120/55 BP,  97% SpO2 During Mobility: 71 HR, 97% SpO2 Post Mobility:  54 HR,  128/63 BP,  100% SpO2  Pt received in bed, agreeable to mobility. Mod I for bed mobility. Ambulated in hallway w/ RW and CG. Asymptomatic throughout. Pt left in bed with call bell and family present.  D'Vante Earlene Plater Mobility Specialist Please contact via Special educational needs teacher or Rehab office at [phone]

## 2023-03-15 NOTE — Interval H&P Note (Signed)
History and Physical Interval Note:  03/15/2023 9:21 AM  Randy Duncan  has presented today for surgery, with the diagnosis of Severe Bioprosthetic Aortic Insufficiency.  The various methods of treatment have been discussed with the patient and family. After consideration of risks, benefits and other options for treatment, the patient has consented to  Procedure(s): Transcatheter Aortic Valve Replacement, Transfemoral (N/A) INTRAOPERATIVE TRANSTHORACIC ECHOCARDIOGRAM (N/A) as a surgical intervention.  [name] history has been reviewed, patient examined, no change in status, stable for surgery.  I have reviewed the patient's chart and labs.  Questions were answered to the patient's satisfaction.     Alleen Borne

## 2023-03-15 NOTE — Progress Notes (Signed)
  [facility]   MULTIDISCIPLINARY HEART VALVE TEAM  Patient doing well s/p TAVR. He is hemodynamically stable. Groin sites stable. ECG with no high grade block. Arterial line discontinued and transferred to 4E. Plan for early ambulation after bedrest completed and hopeful discharge over the next 24-48 hours.   Patient treated with IV Lasix given evidence of acute CHF with an LVEDP measuring . Will follow response.    Georgie Chard NP-C Structural Heart Team  Pager: [phone] Phone: [phone]

## 2023-03-15 NOTE — Progress Notes (Signed)
  Echocardiogram 2D Echocardiogram has been performed.  Janalyn Harder 03/15/2023, 11:21 AM

## 2023-03-15 NOTE — Progress Notes (Addendum)
Progress Note   Patient: Randy Duncan DGL:[id number] DOB: [birth year] DOA: 03/13/2023     2 DOS: the patient was seen and examined on 03/15/2023   Brief hospital course: Mr. Randy Duncan was admitted to the hospital with the working diagnosis of near syncope.  77 yo male with the past medical history of aortic stenosis, sp bioprosthetic valve repair 2008, now with aortic regurgitation. He also has coronary artery disease, hypertension, and dyslipidemia. Recent hospitalization 06/16 to 03/05/23 for heart failure exacerbation, he was found to have severe aortic insufficiency, with plans for TAVR 2 to 4 weeks after his discharge. At home patient continue to have exertional dyspnea and on the day of admission while standing had a near syncope episode that prompted him to come back to the ED. On his initial physical examination his blood pressure was 120/43, HR 76, RR 16 and 02 saturation 100%, lungs with no wheezing or rales, heart with S1 and S2 present and regular, positive diastolic murmur at the base, abdomen with no distention and no lower extremity edema.   Na 134, K 3,9 Cl 97 bicarbonate 25, glucose 126, bun 20 cr 1,28  High sensitive troponin 19 and 22  Wbc 7,3 hgb 10,5 plt 179   Chest radiograph with no cardiomegaly, small opacity at the left upper lobe, hilar region, no other infiltrates, with small bilateral pleural effusions. Sternotomy wires in place.   EKG 78 bpm, normal axis, normal intervals, sinus rhythm with left atrial enlargement, no significant ST segment or T wave changes.   Assessment and Plan: * Severe aortic regurgitation Echocardiogram with preserved LV systolic function EF 60 to 65%, RV systolic function preserved. RV with mild enlargement,  Left atrium with mild to moderate dilatation, RA with moderate dilatation, mild to moderate TR, severe aortic valve regurgitation.   Plan to continue with metoprolol and isosorbide. Continue with amoxicillin that was prescribed post  dental procedure.  Now SP TAVR  Patient had one dose of IV furosemide for volume overload peri procedure.    Essential hypertension Continue blood pressure control with isosorbide, hydralazine and metoprolol.  Coronary artery disease involving native coronary artery of native heart without angina pectoris No chest pain. Continue with aspirin, statin and isosorbide. Patient on ranolazine.   AKI (acute kidney injury) (HCC) Hyponatremia.   Electrolytes corrected with Na 137, K is 4,7 and serum bicarbonate at 22.   Plan to continue close monitoring of renal function and electrolytes.   Mild protein malnutrition (HCC) Continue nutritional supplements.   Chronic anemia Chronic anemia, with hgb at 10,5  Close follow up as outpatient.         Subjective: Patient had TAVR today he is feeling better, no dyspnea or chest pain   Physical Exam: Vitals:   03/15/23 1345 03/15/23 1400 03/15/23 1430 03/15/23 1500  BP: (!) 111/53 (!) 118/58 (!) 112/54 (!) 119/57  Pulse: (!) 52 (!) 54 (!) 54 (!) 57  Resp: 17 20 13 16   Temp:      TempSrc:      SpO2: 97% 97% 97% 98%  Weight:      Height:       Neurology awake and alert ENT with mild pallor Cardiovascular with S1 and S2 present with no gallops, or murmurs No JVD No lower extremity edema Respiratory with no rales or wheezing Abdomen with no distention  Data Reviewed:    Family Communication: I spoke with patient's wife and son at the bedside, we talked in detail about patient's condition,  plan of care and prognosis and all questions were addressed.   Disposition: Status is: Inpatient Remains inpatient appropriate because: post op care, possible dc tomorrow   Planned Discharge Destination: Home      Author: Coralie Keens, MD 03/15/2023 4:18 PM  For on call review [email or web address].

## 2023-03-15 NOTE — Anesthesia Preprocedure Evaluation (Addendum)
Anesthesia Evaluation  Patient identified by MRN, date of birth, ID band Patient awake    Reviewed: Allergy & Precautions, NPO status , Patient's Chart, lab work & pertinent test results  Airway Mallampati: II  TM Distance: >3 FB Neck ROM: Full    Dental no notable dental hx.    Pulmonary COPD, former smoker   Pulmonary exam normal        Cardiovascular hypertension, Pt. on medications and Pt. on home beta blockers + CAD  + Valvular Problems/Murmurs AI  Rhythm:Regular Rate:Normal  ECHO 2024:  1. 21 mm Edwards 3000 TFX Perimount Magna Ease is present. There is mild  paravalvular leak along the intra-valvular fibrosa. There is severe aortic  regurgitation that is very eccentric and appears to be related to  prolapse. There is holodiastolic flow  reversal by Spectral Doppler. The aortic valve has been repaired/replaced.  Aortic valve regurgitation is severe. There is a valve present in the  aortic position. Procedure Date: 2008. Echo findings are consistent with  stenosis and regurgitation of the  aortic prosthesis. Aortic valve mean gradient measures 22.0 mmHg. Aortic  valve Vmax measures 2.99 m/s. Aortic valve acceleration time measures 104  msec.   2. There is an intra-atrial membrane that attaches on the mid inferior  portion of the septum and terminates in the posterior lateral portion of  the right atrium. It does not cause turbulent color Doppler flow. No left  atrial/left atrial appendage  thrombus was detected.   3. Left ventricular ejection fraction, by estimation, is 60 to 65%. The  left ventricle has normal function.   4. Right ventricular systolic function is normal. The right ventricular  size is moderately enlarged.   5. The mitral valve is degenerative. Mild to moderate mitral valve  regurgitation. No evidence of mitral stenosis.   6. Tricuspid valve regurgitation is mild to moderate.   7. There is mild (Grade  II) plaque involving the ascending aorta, aortic  arch and descending aorta.     Neuro/Psych   Anxiety        GI/Hepatic negative GI ROS, Neg liver ROS,,,  Endo/Other  negative endocrine ROS    Renal/GU   negative genitourinary   Musculoskeletal negative musculoskeletal ROS (+)    Abdominal Normal abdominal exam  (+)   Peds  Hematology  (+) Blood dyscrasia, anemia   Anesthesia Other Findings Prosthetic valve AS/AI  Reproductive/Obstetrics                             Anesthesia Physical Anesthesia Plan  ASA: 4  Anesthesia Plan: MAC   Post-op Pain Management:    Induction: Intravenous  PONV Risk Score and Plan: Propofol infusion and Treatment may vary due to age or medical condition  Airway Management Planned: Simple Face Mask and Nasal Cannula  Additional Equipment: Arterial line  Intra-op Plan:   Post-operative Plan:   Informed Consent: I have reviewed the patients History and Physical, chart, labs and discussed the procedure including the risks, benefits and alternatives for the proposed anesthesia with the patient or authorized representative who has indicated his/her understanding and acceptance.     Dental advisory given  Plan Discussed with: CRNA  Anesthesia Plan Comments:        Anesthesia Quick Evaluation

## 2023-03-15 NOTE — Progress Notes (Signed)
Vasopressor PIV consult: pt in procedural area with patent IVs. Appears vasopressor has been stopped. Discussed with Antoinette, RN who recommends waiting until pt returns to floor. Consult canceled. RN will need to re-enter consult if new PIV is needed.

## 2023-03-15 NOTE — Progress Notes (Incomplete)
[facility]   MULTIDISCIPLINARY HEART VALVE TEAM  Patient Name: Randy Duncan Date of Encounter: 03/15/2023  Admit date: 03/13/2023  Primary Care Provider: Tracey Harries, MD [facility] Cardiologist: Charlton Haws, MD *** [facility] Electrophysiologist:  None [facility] Problem List     Principal Problem:   Severe aortic regurgitation Active Problems:   Coronary artery disease involving native coronary artery of native heart without angina pectoris   Essential hypertension   Chronic anemia   AKI (acute kidney injury) (HCC)   Mild protein malnutrition (HCC)   S/P TAVR (transcatheter aortic valve replacement)     Subjective   ***  Inpatient Medications    Scheduled Meds:  amoxicillin  500 mg Oral TID   [START ON 03/16/2023] aspirin  81 mg Oral Daily   atorvastatin  80 mg Oral QHS   chlorhexidine       clonazepam  0.25 mg Oral Q4H   [START ON 03/16/2023] hydrALAZINE  25 mg Oral BID   [START ON 03/16/2023] isosorbide mononitrate  15 mg Oral Daily   metoprolol succinate  12.5 mg Oral QHS   [START ON 03/16/2023] ranolazine  1,000 mg Oral BID   sodium chloride flush  3 mL Intravenous Q12H   sodium chloride flush  3 mL Intravenous Q12H   Continuous Infusions:  sodium chloride     sodium chloride 50 mL/hr at 03/15/23 1308   sodium chloride     sodium chloride      ceFAZolin (ANCEF) IV 2 g (03/15/23 1459)   nitroGLYCERIN     PRN Meds: sodium chloride, sodium chloride, acetaminophen **OR** acetaminophen, acetaminophen, alum & mag hydroxide-simeth, chlorhexidine, morphine injection, ondansetron (ZOFRAN) IV, ondansetron (ZOFRAN) IV, oxyCODONE, sodium chloride flush, sodium chloride flush, traMADol, traZODone   Vital Signs    Vitals:   03/15/23 1400 03/15/23 1430 03/15/23 1500 03/15/23 1705  BP: (!) 118/58 (!) 112/54 (!) 119/57 (!) 111/50  Pulse: (!) 54 (!) 54 (!) 57   Resp: 20 13 16    Temp:    (!) 97.5 F (36.4 C)  TempSrc:    Oral  SpO2: 97% 97%  98%   Weight:      Height:        Intake/Output Summary (Last 24 hours) at 03/15/2023 1711 Last data filed at 03/15/2023 1705 Gross per 24 hour  Intake 1138.33 ml  Output 2400 ml  Net -1261.67 ml   Filed Weights   03/13/23 1243 03/14/23 0500 03/15/23 0504  Weight: 73 kg 66.3 kg 65.8 kg    Physical Exam   *** GEN: Well nourished, well developed, in no acute distress.  HEENT: Grossly normal.  Neck: Supple, no JVD, carotid bruits, or masses. Cardiac: RRR, no murmurs, rubs, or gallops. No clubbing, cyanosis, edema.   Respiratory:  Respirations regular and unlabored, clear to auscultation bilaterally. GI: Soft, nontender, nondistended, BS + x 4. MS: no deformity or atrophy. Skin: warm and dry, no rash. Neuro:  Strength and sensation are intact. Psych: AAOx3.  Normal affect.  Labs    CBC Recent Labs    03/13/23 1253 03/15/23 1154  WBC 7.3  --   HGB 10.5* 8.8*  HCT 31.2* 26.0*  MCV 98.7  --   PLT 179  --    Basic Metabolic Panel Recent Labs    16/10/96 0120 03/15/23 0112 03/15/23 1154  NA 134* 134* 137  K 3.8 4.2 4.7  CL 100 101 102  CO2 25 26  --   GLUCOSE  100* 105* 150*  BUN 19 22 22   CREATININE 1.08 1.08 1.00  CALCIUM 8.6* 8.7*  --    Liver Function Tests No results for input(s): "AST", "ALT", "ALKPHOS", "BILITOT", "PROT", "ALBUMIN" in the last 72 hours. No results for input(s): "LIPASE", "AMYLASE" in the last 72 hours. Cardiac Enzymes No results for input(s): "CKTOTAL", "CKMB", "CKMBINDEX", "TROPONINI" in the last 72 hours. BNP Invalid input(s): "POCBNP" D-Dimer No results for input(s): "DDIMER" in the last 72 hours. Hemoglobin A1C No results for input(s): "HGBA1C" in the last 72 hours. Fasting Lipid Panel No results for input(s): "CHOL", "HDL", "LDLCALC", "TRIG", "CHOLHDL", "LDLDIRECT" in the last 72 hours. Thyroid Function Tests No results for input(s): "TSH", "T4TOTAL", "T3FREE", "THYROIDAB" in the last 72 hours.  Invalid input(s):  "FREET3"  Telemetry    *** - Personally Reviewed  ECG    *** - Personally Reviewed  Radiology    ECHOCARDIOGRAM LIMITED  Result Date: 03/15/2023    ECHOCARDIOGRAM LIMITED REPORT   Patient Name:   Randy Duncan Date of Exam: 03/15/2023 Medical Rec #:  [id number]        Height:       67.0 in Accession #:    [id number]       Weight:       145.1 lb Date of Birth:  [birth year]       BSA:          1.764 m Patient Age:    77 years         BP:           113/49 mmHg Patient Gender: M                HR:           88 bpm. Exam Location:  Inpatient Procedure: Limited Echo, Cardiac Doppler and Color Doppler Indications:     I35.1 Nonrheumatic aortic (valve) insufficiency; I35.0                  Nonrheumatic aortic (valve) stenosis  History:         Patient has prior history of Echocardiogram examinations, most                  recent 03/04/2023. Aortic Valve Disease; Signs/Symptoms:Syncope.                  Aortic stenosis. Aortic insufficiency. AVR.                  Aortic Valve: 23 mm Sapien prosthetic, stented (TAVR) valve is                  present in the aortic position. Procedure Date: 03/15/2023.  Sonographer:     Sheralyn Boatman RDCS Referring Phys:  [phone] MICHAEL COOPER Diagnosing Phys: Riley Lam MD  Sonographer Comments: TAVR procedure. IMPRESSIONS  1. Prior to procedure there was a 21 mm Edwards 3000 THX Perimount Magna Ease with mild paravalvular leak and severe central aortic regurgitation, PHT 143 ms.Mean gradient 15 mm Hg, PEak gradient 31 mm Hg. EOA 1.45 cm2. After procedure original valve has been fractured with placement of a 23 mm Edwards Sapien Valve. There is no paravalular or central regurgitation. Mean gradient 5 mmHg, peak gradient 9 mm Hg. Normal DVI. EOAi 1.70 cm2/m2.. There is a 23 mm Sapien prosthetic (TAVR) valve present in the aortic position. Procedure Date: 03/15/2023. Echo findings are consistent with normal structure and function of the aortic valve prosthesis.  2. Left  ventricular  ejection fraction, by estimation, is 60 to 65%. The left ventricle has normal function.  3. The mitral valve is degenerative. Mild mitral valve regurgitation. FINDINGS  Left Ventricle: Left ventricular ejection fraction, by estimation, is 60 to 65%. The left ventricle has normal function. Right Ventricle: There is severely elevated pulmonary artery systolic pressure. The tricuspid regurgitant velocity is 3.67 m/s, and with an assumed right atrial pressure of 15 mmHg, the estimated right ventricular systolic pressure is 68.9 mmHg. Pericardium: There is no evidence of pericardial effusion. Mitral Valve: The mitral valve is degenerative in appearance. Mild mitral valve regurgitation. Tricuspid Valve: Tricuspid valve regurgitation is mild. Aortic Valve: Prior to procedure there was a 21 mm Edwards 3000 THX Perimount Magna Ease with mild paravalvular leak and severe central aortic regurgitation, PHT 143 ms.Mean gradient 15 mm Hg, PEak gradient 31 mm Hg. EOA 1.45 cm2. After procedure original valve has been fractured with placement of a 23 mm Edwards Sapien Valve. There is no paravalular or central regurgitation. Mean gradient 5 mmHg, peak gradient 9 mm Hg. Normal DVI. EOAi 1.70 cm2/m2. Aortic valve mean gradient measures 5.0 mmHg. Aortic valve peak gradient measures 9.4 mmHg. Aortic valve area, by VTI measures 2.97 cm. There is a 23 mm Sapien prosthetic, stented (TAVR) valve present in the aortic position. Procedure Date: 03/15/2023. Echo findings are consistent with normal structure and function of the aortic valve prosthesis. Additional Comments: Spectral Doppler performed. Color Doppler performed.  LEFT VENTRICLE PLAX 2D LVOT diam:     2.30 cm LV SV:         120 LV SV Index:   68 LVOT Area:     4.15 cm  LV Volumes (MOD) LV vol d, MOD A2C: 109.0 ml LV vol d, MOD A4C: 134.0 ml LV vol s, MOD A2C: 48.6 ml LV vol s, MOD A4C: 49.4 ml LV SV MOD A2C:     60.4 ml LV SV MOD A4C:     134.0 ml LV SV MOD BP:      73.8 ml AORTIC  VALVE AV Area (Vmax):    3.07 cm AV Area (Vmean):   3.27 cm AV Area (VTI):     2.97 cm AV Vmax:           153.00 cm/s AV Vmean:          100.400 cm/s AV VTI:            0.404 m AV Peak Grad:      9.4 mmHg AV Mean Grad:      5.0 mmHg LVOT Vmax:         113.00 cm/s LVOT Vmean:        79.000 cm/s LVOT VTI:          0.288 m LVOT/AV VTI ratio: 0.71  AORTA Ao Asc diam: 3.40 cm TRICUSPID VALVE TR Peak grad:   53.9 mmHg TR Vmax:        367.00 cm/s  SHUNTS Systemic VTI:  0.29 m Systemic Diam: 2.30 cm Riley Lam MD Electronically signed by Riley Lam MD Signature Date/Time: 03/15/2023/3:42:19 PM    Final    Structural Heart Procedure  Result Date: 03/15/2023 See surgical note for result.   Cardiac Studies   TAVR OPERATIVE NOTE     Date of Procedure:                03/15/2023   Preoperative Diagnosis:      Severe bioprosthetic aortic valve insufficiency   Postoperative Diagnosis:  Same    Procedure:        Valve in valve transcatheter Aortic Valve Replacement - Percutaneous Right Transfemoral Approach             Edwards Sapien 3 Ultra Resilia THV (size 23 mm, model # 9755RSL,  serial # [id number])              Co-Surgeons:                        Alleen Borne, MD and Verne Carrow, MD     Anesthesiologist:                  Lavenia Atlas, DO   Echocardiographer:              Merlyn Lot, MD   Pre-operative Echo Findings: Severe prosthetic aortic insufficiency Normal left ventricular systolic function   Post-operative Echo Findings: No paravalvular leak Normal left ventricular systolic function  Patient Profile     Randy Duncan is a 77 y.o. male with a hx of CAD, HTN, DM2, hx of ascending aorta repair and bioprosthetic AVR in 2008, emphysema, hyponatremia, celiac disease, and severe bioprosthetic valve dysfunction with severe AI and recent admission for pulmonary edema who presented to [facility] on [facility]/13/23 for evaluation of pre syncope. Patient kept inpatient  given upcoming TAVR on 03/15/23.  Assessment & Plan    Near syncope: likely volume depleted with recent dental extractions with poor PO intake. Creat bumped c/w dehydration. Losartan and Lasix held.   Severe bioprosthetic valve dysfunction with severe AI: s/p successful VIV TAVR via the TF approach by Dr. Clifton James & Laneta Simmers. Groin sites are stable. ECG with 1st deg AV block *** but no HAVB. Continued on home aspirin. Plan for discharge home with close follow up in the outpatient setting.    Acute on chronic HFpEF: treated with one dose of IV lasix given LVEDP of 28 mm hg during procedure. Plan to resume home lasix at discharge.    CAD: cardiac catheterization on 02/04/23 with Dr. Ivan Croft which showed a 55% proximal diagonal lesion, 30% mid RCA lesion and otherwise no significant obstruction. Continue medical therapy.    Chronic hyponatremia: sodium level stable ~134   HTN: BP stable. Resume home meds at discharge    Recent dental extractions: had 7 teeth extracted on 03/10/23. Completed 1 week of amoxicillin as Rx'd by the dentist.   Pulmonary nodule: noted on pre TAVR CT. This will be discussed in the outpatient setting.    Signed, Cline Crock, PA-C  03/15/2023, 5:11 PM  Pager [phone]

## 2023-03-15 NOTE — Transfer of Care (Signed)
Immediate Anesthesia Transfer of Care Note  Patient: Randy Duncan  Procedure(s) Performed: Transcatheter Aortic Valve Replacement, Transfemoral INTRAOPERATIVE TRANSTHORACIC ECHOCARDIOGRAM  Patient Location: Cath Lab  Anesthesia Type:MAC  Level of Consciousness: drowsy and patient cooperative  Airway & Oxygen Therapy: Patient Spontanous Breathing and Patient connected to nasal cannula oxygen  Post-op Assessment: Report given to RN and Post -op Vital signs reviewed and stable  Post vital signs: Reviewed and stable  Last Vitals:  Vitals Value Taken Time  BP 139/68 03/15/23 1135  Temp    Pulse 62 03/15/23 1137  Resp 11 03/15/23 1137  SpO2 94 % 03/15/23 1137  Vitals shown include unvalidated device data.  Last Pain:  Vitals:   03/15/23 0841  TempSrc:   PainSc: 0-No pain         Complications: There were no known notable events for this encounter.

## 2023-03-16 ENCOUNTER — Encounter (HOSPITAL_COMMUNITY): Payer: Self-pay | Admitting: Cardiovascular Disease

## 2023-03-16 ENCOUNTER — Inpatient Hospital Stay (HOSPITAL_COMMUNITY): Payer: Medicare HMO

## 2023-03-16 DIAGNOSIS — I5043 Acute on chronic combined systolic (congestive) and diastolic (congestive) heart failure: Secondary | ICD-10-CM | POA: Diagnosis not present

## 2023-03-16 DIAGNOSIS — Z1152 Encounter for screening for COVID-19: Secondary | ICD-10-CM | POA: Diagnosis not present

## 2023-03-16 DIAGNOSIS — Z952 Presence of prosthetic heart valve: Secondary | ICD-10-CM

## 2023-03-16 DIAGNOSIS — I5033 Acute on chronic diastolic (congestive) heart failure: Secondary | ICD-10-CM | POA: Insufficient documentation

## 2023-03-16 DIAGNOSIS — I350 Nonrheumatic aortic (valve) stenosis: Secondary | ICD-10-CM | POA: Diagnosis not present

## 2023-03-16 DIAGNOSIS — I110 Hypertensive heart disease with heart failure: Secondary | ICD-10-CM | POA: Diagnosis not present

## 2023-03-16 DIAGNOSIS — Z006 Encounter for examination for normal comparison and control in clinical research program: Secondary | ICD-10-CM | POA: Diagnosis not present

## 2023-03-16 LAB — BASIC METABOLIC PANEL
Anion gap: 9 (ref 5–15)
BUN: 15 mg/dL (ref 8–23)
CO2: 25 mmol/L (ref 22–32)
Calcium: 8.2 mg/dL — ABNORMAL LOW (ref 8.9–10.3)
Chloride: 98 mmol/L (ref 98–111)
Creatinine, Ser: 1.06 mg/dL (ref 0.61–1.24)
GFR, Estimated: >60 mL/min (ref >60)
Glucose, Bld: 137 mg/dL — ABNORMAL HIGH (ref 70–99)
Potassium: 3.5 mmol/L (ref 3.5–5.1)
Sodium: 132 mmol/L — ABNORMAL LOW (ref 135–145)

## 2023-03-16 LAB — ECHOCARDIOGRAM COMPLETE
AR max vel: 1.5 cm2
AV Area VTI: 1.47 cm2
AV Area mean vel: 1.44 cm2
AV Mean grad: 12 mmHg
AV Peak grad: 22.1 mmHg
Ao pk vel: 2.35 m/s
Area-P 1/2: 2.91 cm2
Calc EF: 70.1 %
Height: 67 in
S' Lateral: 2.7 cm
Single Plane A2C EF: 73.8 %
Single Plane A4C EF: 67.5 %
Weight: 2328.06 oz

## 2023-03-16 LAB — CBC
HCT: 28 % — ABNORMAL LOW (ref 39.0–52.0)
Hemoglobin: 9.5 g/dL — ABNORMAL LOW (ref 13.0–17.0)
MCH: 33 pg (ref 26.0–34.0)
MCHC: 33.9 g/dL (ref 30.0–36.0)
MCV: 97.2 fL (ref 80.0–100.0)
Platelets: 117 10*3/uL — ABNORMAL LOW (ref 150–400)
RBC: 2.88 MIL/uL — ABNORMAL LOW (ref 4.22–5.81)
RDW: 12.7 % (ref 11.5–15.5)
WBC: 9 10*3/uL (ref 4.0–10.5)
nRBC: 0 % (ref 0.0–0.2)

## 2023-03-16 LAB — MAGNESIUM: Magnesium: 1.7 mg/dL (ref 1.7–2.4)

## 2023-03-16 NOTE — Plan of Care (Signed)

## 2023-03-16 NOTE — Care Management Important Message (Signed)
Important Message  Patient Details  Name: ECHO TOPP MRN: [id number] Date of Birth: Oct 14, 1945   Medicare Important Message Given:  Yes     Renie Ora 03/16/2023, 8:40 AM

## 2023-03-16 NOTE — Discharge Instructions (Signed)

## 2023-03-16 NOTE — TOC Transition Note (Signed)
Transition of Care (TOC) - CM/SW Discharge Note Donn Pierini RN, BSN Transitions of Care Unit 4E- RN Case Manager See Treatment Team for direct phone #   Patient Details  Name: Randy Duncan MRN: [id number] Date of Birth: 11-02-1945  Transition of Care [facility]) CM/SW Contact:  Darrold Span, RN Phone Number: 03/16/2023, 10:19 AM   Clinical Narrative:    Pt stable for transition home today s/p TAVR,   Transition of Care Asessment: Insurance and Status: Insurance coverage has been reviewed Patient has primary care physician: Yes Home environment has been reviewed: home w/ spouse Prior level of function:: independent Prior/Current Home Services: No current home services Social Determinants of Health Reivew: SDOH reviewed no interventions necessary Readmission risk has been reviewed: Yes Transition of care needs: no transition of care needs at this time     Final next level of care: Home/Self Care Barriers to Discharge: No Barriers Identified   Patient Goals and CMS Choice   Choice offered to / list presented to : NA  Discharge Placement                 Home        Discharge Plan and Services Additional resources added to the After Visit Summary for     Discharge Planning Services: CM Consult Post Acute Care Choice: NA          DME Arranged: N/A DME Agency: NA       HH Arranged: NA HH Agency: NA        Social Determinants of Health (SDOH) Interventions SDOH Screenings   Food Insecurity: No Food Insecurity (03/14/2023)  Housing: Patient Declined (03/14/2023)  Transportation Needs: No Transportation Needs (03/14/2023)  Utilities: Not At Risk (03/14/2023)  Tobacco Use: Medium Risk (03/16/2023)     Readmission Risk Interventions    03/16/2023   10:18 AM 02/28/2023    9:37 AM  Readmission Risk Prevention Plan  Transportation Screening Complete Complete  HRI or Home Care Consult Complete Complete  Social Work Consult for Recovery Care  Planning/Counseling Complete Complete  Palliative Care Screening Not Applicable Not Applicable  Medication Review Oceanographer) Complete Complete

## 2023-03-16 NOTE — Progress Notes (Signed)
Explained discharge instructions to patient. Reviewed follow up appointment and next medication administration times. Also reviewed education. Patient verbalized having an understanding for instructions given. All belongings are in the patient's possession. IVs and telemetry were removed. CCMD was notified. No other needs verbalized. Transported downstairs for discharge.

## 2023-03-16 NOTE — Progress Notes (Signed)
CARDIAC REHAB PHASE I   PRE:  Rate/Rhythm: 62 SR    BP: sitting 119/56    SpO2: 96 RA  MODE:  Ambulation: 420 ft   POST:  Rate/Rhythm: 86 SR    BP: sitting 158/80     SpO2: 98 RA  Pt moved out of bed and ambulated with standby assist. Tolerated well, no c/o. Thankful to be up. Discussed with pt and wife restrictions, exercise, diet, and CRPII. Pt receptive. Will refer to [facility] CRPII.  [id number]   Ethelda Chick BS, ACSM-CEP 03/16/2023 10:26 AM

## 2023-03-18 ENCOUNTER — Telehealth: Payer: Self-pay | Admitting: Physician Assistant

## 2023-03-18 NOTE — Telephone Encounter (Signed)
Attempted TOC call. LVM to call back.  Cline Crock PA-C  [facility]

## 2023-03-18 NOTE — Progress Notes (Unsigned)
[facility]   MULTIDISCIPLINARY HEART VALVE CLINIC                                     Cardiology Office Note:    Date:  03/22/2023   ID:  GIOVONI HEFFERON, DOB 08/04/1946, MRN [id number]  PCP:  Tracey Harries, MD  [facility] Cardiologist:  Charlton Haws, MD  / Dr. Clifton James & Dr. Laneta Simmers (TAVR)  [facility] Electrophysiologist:  None   Referring MD: Tracey Harries, MD   Chief Complaint  Patient presents with   Follow-up    TOC s/p TAVR   History of Present Illness:    Randy Duncan is a 77 y.o. 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 He underwent successful ViV 03/15/23 and is being seen today for TOC follow up.    Mr. Sennett has a history of an ascending aorta repair and AVR with a 21 mm Edwards Magna stented bioprosthetic valve in 2008 at [place] with Dr. Lequita Halt. He has done well and remained active since that time. Echo 11/11/22 showed EF 65% and normally functioning prosthetic valve well seated with a mean gradient of 11 mmHg and no reported AR.   In early May he developed general malaise, shortness of breath, palpitations, and exercise intolerance. CT angio of chest 01/26/23 with no PE, acute infiltrate or effusion. Nuclear stress test 01/28/23 was borderline for demonstration of inducible ischemia in the inferior region. The patient subsequently had a cardiac catheterization on 02/04/23 with Dr. Ivan Croft which showed a 55% proximal diagonal lesion, 30% mid RCA lesion and otherwise no significant obstruction, but did show an elevated left ventricular end-diastolic pressure with hypertension. Of note, the first diagonal lesion was thought to be 90% obstructed on prior cardiac  catheterization in 2020 but now reduced to 50 to 60%. He then underwent outpatient titration of BP meds (several medication intolerances noted).    He was then followed by [facility] and his PCP for symptoms of worsening exercise intolerance, dyspnea, and palpitations. Given recent reassuring cardiac/pulmonary testing, PFTs and referral to pulm/endocrinology were ordered.   He then presented to [facility] ED on 02/27/23 for evaluation of fever and cough, chest pain as well as chronic polyuria and polydipsia. Found to have an elevated BNP and pulmonary vascular congestion on CXR and CTA. Echo 02/28/23 showed EF 60-65%, mild RVE, mild MR, mild to mod TR, mild AS (mean gradient 20mm hg, AVA 1.46cm2) with severe bioprosthetic AR with a PHT of . Subsequent TEE 03/04/23 confirmed severe central AI. He was discharged home on 03/05/23 with plans for outpatient VIV TAVR 03/15/23.    Given poor dentition, he had multiple dental extractions on 03/10/27 and developed severe dizziness and was re-admitted prior to his procedure.   He is now s/p successful VIV TAVR via the TF approach by Dr. Clifton James & Laneta Simmers. POD 1 echo with stable valve function and no PVL or central AI with 23mm S3UR with a mean gradient at , AVA by VTI 1.47cm2, and DI 0.52.   He presents today with his wife and reports that overall he has been well however has been  very unhappy about his diuretic regimen. He states that he was treated with IV Lasix in the hospital for volume overload and was discharged on PO Lasix which is new for him. While in the hospital, he was able to use a condom cath to suction and was therefore able to get sleep at night. Since being home, he has urinated in the bed every night despite also wearing depends.  He comes with a weight log that shows a several pound decrease from his baseline with diuretics. Has has not been resting well due to this however has still been walking for exercise with no SOB, chest pain,  or dizziness. He denies chest pain, palpitations, LE edema, and orthopnea. No bleeding in stool or urine.    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 Laneta Simmers   Sleep disorder    Snoring     Past Surgical History:  Procedure Laterality Date   AORTIC VALVE REPAIR  11/2006   CATARACT EXTRACTION Bilateral 2010   INTRAOPERATIVE TRANSTHORACIC ECHOCARDIOGRAM N/A 03/15/2023   Procedure: INTRAOPERATIVE TRANSTHORACIC ECHOCARDIOGRAM;  Surgeon: Kathleene Hazel, MD;  Location: [facility] INVASIVE CV LAB;  Service: Open Heart Surgery;  Laterality: N/A;   TEE WITHOUT CARDIOVERSION N/A 03/04/2023   Procedure: TRANSESOPHAGEAL ECHOCARDIOGRAM;  Surgeon: Christell Constant, MD;  Location: [facility] INVASIVE CV LAB;  Service: Cardiovascular;  Laterality: N/A;   TRANSCATHETER AORTIC VALVE REPLACEMENT, TRANSFEMORAL N/A 03/15/2023   Procedure: Transcatheter Aortic Valve Replacement, Transfemoral;  Surgeon: Kathleene Hazel, MD;  Location: [facility] INVASIVE CV LAB;  Service: Open Heart Surgery;  Laterality: N/A;   TUMOR EXCISION     Left parotid gland (done in [employer])    Current Medications: Current Meds  Medication Sig   acetaminophen (TYLENOL) 500 MG tablet Take 1,000 mg by mouth as needed for mild pain or moderate pain.   amoxicillin (AMOXIL) 500 MG capsule Take 500 mg by mouth 3 (three) times daily.   aspirin EC 81 MG tablet Take 81 mg by mouth in the morning. Swallow whole.   atorvastatin (LIPITOR) 80 MG tablet Take 80 mg by mouth at bedtime.   Cholecalciferol (VITAMIN D3) 125 MCG (5000 UT) CAPS Take 5,000 Units by mouth in the morning.   clonazePAM (KLONOPIN) 0.5 MG tablet Take 0.25 mg  by mouth every 4 (four) hours.   hydrALAZINE (APRESOLINE) 25 MG tablet Take 25 mg by mouth in the morning and at bedtime.   isosorbide mononitrate (IMDUR) 30 MG 24 hr tablet Take 0.5 tablets (15 mg total) by mouth daily.   metoprolol succinate (TOPROL-XL) 25 MG 24 hr tablet Take 12.5 mg by mouth at bedtime.   ranolazine (RANEXA) 1000 MG SR tablet Take 1,000 mg by mouth in the morning and at bedtime.   ZYRTEC ALLERGY 10 MG tablet Take 10 mg by mouth See admin instructions. Take 10 mg by mouth in the evening-bedtime, along with the 2nd daily dose of Hydralazine   [DISCONTINUED] furosemide (LASIX) 40 MG tablet Take 1 tablet (40 mg total) by mouth daily.     Allergies:   Losartan potassium, Wheat, Naproxen, Amlodipine, Coreg [carvedilol], Hydralazine, Lisinopril, Bupropion, and Cephalexin  Social History   Socioeconomic History   Marital status: Married    Spouse name: Randy Duncan   Number of children: 3   Years of education: 14   Highest education level: Not on file  Occupational History    Employer: [employer]  Tobacco Use   Smoking status: Former    Packs/day: 1.00    Years: 40.00    Additional pack years: 0.00    Total pack years: 40.00    Types: Cigarettes   Smokeless tobacco: Never   Tobacco comments:    quit 02/2012  Vaping Use   Vaping Use: Never used  Substance and Sexual Activity   Alcohol use: No    Comment: quit in 2006   Drug use: No   Sexual activity: Not on file  Other Topics Concern   Not on file  Social History Narrative   Patient is right handed, consumes 1 soda daily,resides with wife and 2 adult children.   Social Determinants of Health   Financial Resource Strain: Not on file  Food Insecurity: No Food Insecurity (03/14/2023)   Hunger Vital Sign    Worried About Running Out of Food in the Last Year: Never true    Ran Out of Food in the Last Year: Never true  Transportation Needs: No Transportation Needs (03/14/2023)   PRAPARE - Scientist, research (physical sciences) (Medical): No    Lack of Transportation (Non-Medical): No  Physical Activity: Not on file  Stress: Not on file  Social Connections: Not on file    Family History: [name] family history includes Celiac disease in his daughter; Emphysema in his maternal grandfather. There is no history of Lung disease.  ROS:   Please see the history of present illness.    All other systems reviewed and are negative.  EKGs/Labs/Other Studies Reviewed:    The following studies were reviewed today:  Cardiac Studies & Procedures       ECHOCARDIOGRAM  ECHOCARDIOGRAM COMPLETE 03/16/2023  Narrative ECHOCARDIOGRAM REPORT    Patient Name:   Randy Duncan Date of Exam: 03/16/2023 Medical Rec #:  [id number]        Height:       67.0 in Accession #:    [id number]       Weight:       145.5 lb Date of Birth:  1946-07-30       BSA:          1.767 m Patient Age:    50 years         BP:           119/63 mmHg Patient Gender: M                HR:           65 bpm. Exam Location:  Inpatient  Procedure: Color Doppler and Cardiac Doppler  Indications:    s/p TAVR  History:        Patient has prior history of Echocardiogram examinations, most recent 03/15/2023. CAD, Aortic Valve Disease and s/p TAVR; Risk Factors:Hypertension and Sleep Apnea.  Sonographer:    Milbert Coulter Referring Phys: [phone] Crystian Frith D Latrice Storlie  IMPRESSIONS   1. Left ventricular ejection fraction, by estimation, is 60 to 65%. The left ventricle has normal function. The left ventricle has no regional wall motion abnormalities. Left ventricular diastolic parameters are consistent with Grade I diastolic dysfunction (impaired relaxation). 2. Right ventricular systolic function is normal. The right ventricular size is  normal. 3. The mitral valve is degenerative. No evidence of mitral valve regurgitation. The mean mitral valve gradient is 2.0 mmHg with average heart rate of 63 bpm. 4. DVI 0.52. No PVL or central AI s/p Valve  in Valve with 23 mm Sapien TAVR. The aortic valve has been repaired/replaced. Aortic valve regurgitation is not visualized. Echo findings are consistent with normal structure and function of the aortic valve prosthesis. Aortic valve area, by VTI measures 1.47 cm. Aortic valve mean gradient measures 12.0 mmHg.  Comparison(s): Prior images reviewed side by side. Echo findings are consistent with normal structure and function of the aortic valve prosthesis.  FINDINGS Left Ventricle: Left ventricular ejection fraction, by estimation, is 60 to 65%. The left ventricle has normal function. The left ventricle has no regional wall motion abnormalities. The left ventricular internal cavity size was normal in size. There is no left ventricular hypertrophy. Left ventricular diastolic parameters are consistent with Grade I diastolic dysfunction (impaired relaxation).  Right Ventricle: The right ventricular size is normal. No increase in right ventricular wall thickness. Right ventricular systolic function is normal. The tricuspid regurgitant velocity is 2.75 m/s, and with an assumed right atrial pressure of 3 mmHg, the estimated right ventricular systolic pressure is 33.2 mmHg.  Left Atrium: Left atrial size was normal in size.  Right Atrium: Right atrial size was normal in size.  Pericardium: There is no evidence of pericardial effusion.  Mitral Valve: The mitral valve is degenerative in appearance. No evidence of mitral valve regurgitation. The mean mitral valve gradient is 2.0 mmHg with average heart rate of 63 bpm.  Tricuspid Valve: The tricuspid valve is normal in structure. Tricuspid valve regurgitation is mild . No evidence of tricuspid stenosis.  Aortic Valve: DVI 0.52. No PVL or central AI s/p Valve in Valve with 23 mm Sapien TAVR. The aortic valve has been repaired/replaced. Aortic valve regurgitation is not visualized. Aortic valve mean gradient measures 12.0 mmHg. Aortic valve peak  gradient measures 22.1 mmHg. Aortic valve area, by VTI measures 1.47 cm. There is a 23 mm Sapien prosthetic, stented (TAVR) valve present in the aortic position. Echo findings are consistent with normal structure and function of the aortic valve prosthesis.  Pulmonic Valve: The pulmonic valve was normal in structure. Pulmonic valve regurgitation is not visualized. No evidence of pulmonic stenosis.  Aorta: The aortic root and ascending aorta are structurally normal, with no evidence of dilitation.  IAS/Shunts: No atrial level shunt detected by color flow Doppler.   LEFT VENTRICLE PLAX 2D LVIDd:         4.60 cm     Diastology LVIDs:         2.70 cm     LV e' medial:    10.70 cm/s LV PW:         1.00 cm     LV E/e' medial:  9.7 LV IVS:        1.00 cm     LV e' lateral:   12.70 cm/s LVOT diam:     1.90 cm     LV E/e' lateral: 8.2 LV SV:         78 LV SV Index:   44 LVOT Area:     2.84 cm  LV Volumes (MOD) LV vol d, MOD A2C: 68.2 ml LV vol d, MOD A4C: 87.2 ml LV vol s, MOD A2C: 17.9 ml LV vol s, MOD A4C: 28.3 ml LV SV MOD A2C:     50.3 ml LV SV  MOD A4C:     87.2 ml LV SV MOD BP:      53.9 ml  RIGHT VENTRICLE RV Basal diam:  4.00 cm RV Mid diam:    2.80 cm RV S prime:     15.40 cm/s TAPSE (M-mode): 2.0 cm  LEFT ATRIUM             Index        RIGHT ATRIUM           Index LA diam:        3.90 cm 2.21 cm/m   RA Area:     15.70 cm LA Vol (A2C):   56.1 ml 31.76 ml/m  RA Volume:   40.00 ml  22.64 ml/m LA Vol (A4C):   39.3 ml 22.25 ml/m LA Biplane Vol: 51.3 ml 29.04 ml/m AORTIC VALVE AV Area (Vmax):    1.50 cm AV Area (Vmean):   1.44 cm AV Area (VTI):     1.47 cm AV Vmax:           235.00 cm/s AV Vmean:          160.000 cm/s AV VTI:            0.533 m AV Peak Grad:      22.1 mmHg AV Mean Grad:      12.0 mmHg LVOT Vmax:         124.00 cm/s LVOT Vmean:        81.200 cm/s LVOT VTI:          0.276 m LVOT/AV VTI ratio: 0.52  AORTA Ao Root diam: 2.80 cm Ao Asc diam:   2.90 cm  MITRAL VALVE                TRICUSPID VALVE MV Area (PHT): 2.91 cm     TR Peak grad:   30.2 mmHg MV Mean grad:  2.0 mmHg     TR Vmax:        275.00 cm/s MV Decel Time: 261 msec MV E velocity: 104.00 cm/s  SHUNTS MV A velocity: 91.70 cm/s   Systemic VTI:  0.28 m MV E/A ratio:  1.13         Systemic Diam: 1.90 cm  Riley Lam MD Electronically signed by Riley Lam MD Signature Date/Time: 03/16/2023/4:11:23 PM    Final   TEE  ECHO TEE 03/04/2023  Narrative TRANSESOPHOGEAL ECHO REPORT    Patient Name:   Randy Duncan Date of Exam: 03/04/2023 Medical Rec #:  [id number]        Height:       67.0 in Accession #:    [id number]       Weight:       160.7 lb Date of Birth:  12/16/1945       BSA:          1.843 m Patient Age:    76 years         BP:           99/44 mmHg Patient Gender: M                HR:           66 bpm. Exam Location:  Inpatient  Procedure: Transesophageal Echo, Cardiac Doppler, Color Doppler and 3D Echo  Indications:     aortic regurgitation  History:         Patient has prior history of Echocardiogram examinations, most recent 02/28/2023. CHF, CAD, ascending aorta repair; Risk  Factors:Hypertension, Diabetes and Former Smoker. Aortic Valve: valve is present in the aortic position. Procedure Date: 2008.  Sonographer:     Delcie Roch RDCS Referring Phys:  [id number] Roe Rutherford [facility] Diagnosing Phys: Riley Lam MD  PROCEDURE: After discussion of the risks and benefits of a TEE, an informed consent was obtained from the patient. The transesophogeal probe was passed without difficulty through the esophogus of the patient. Imaged were obtained with the patient in a left lateral decubitus position. Sedation performed by different physician. The patient was monitored while under deep sedation. Anesthestetic sedation was provided intravenously by Anesthesiology: 288mg  of Propofol, 100mg  of Lidocaine. The patient developed  no complications during the procedure.  IMPRESSIONS   1. 21 mm Edwards 3000 TFX Perimount Magna Ease is present. There is mild paravalvular leak along the intra-valvular fibrosa. There is severe aortic regurgitation that is very eccentric and appears to be related to prolapse. There is holodiastolic flow reversal by Spectral Doppler. The aortic valve has been repaired/replaced. Aortic valve regurgitation is severe. There is a valve present in the aortic position. Procedure Date: 2008. Echo findings are consistent with stenosis and regurgitation of the aortic prosthesis. Aortic valve mean gradient measures 22.0 mmHg. Aortic valve Vmax measures 2.99 m/s. Aortic valve acceleration time measures 104 msec. 2. There is an intra-atrial membrane that attaches on the mid inferior portion of the septum and terminates in the posterior lateral portion of the right atrium. It does not cause turbulent color Doppler flow. No left atrial/left atrial appendage thrombus was detected. 3. Left ventricular ejection fraction, by estimation, is 60 to 65%. The left ventricle has normal function. 4. Right ventricular systolic function is normal. The right ventricular size is moderately enlarged. 5. The mitral valve is degenerative. Mild to moderate mitral valve regurgitation. No evidence of mitral stenosis. 6. Tricuspid valve regurgitation is mild to moderate. 7. There is mild (Grade II) plaque involving the ascending aorta, aortic arch and descending aorta.  FINDINGS Left Ventricle: Left ventricular ejection fraction, by estimation, is 60 to 65%. The left ventricle has normal function. The left ventricular internal cavity size was normal in size.  Right Ventricle: The right ventricular size is moderately enlarged. Right vetricular wall thickness was not well visualized. Right ventricular systolic function is normal.  Left Atrium: There is an intra-atrial membrane that attaches on the mid inferior portion of the septum  and terminates in the posterior lateral portion of the right atrium. It does not cause turbulent color Doppler flow. Left atrial size was normal in size. No left atrial/left atrial appendage thrombus was detected.  Right Atrium: Right atrial size was normal in size.  Pericardium: Trivial pericardial effusion is present. The pericardial effusion is circumferential.  Mitral Valve: The mitral valve is degenerative in appearance. Mild to moderate mitral valve regurgitation. No evidence of mitral valve stenosis.  Tricuspid Valve: The tricuspid valve is normal in structure. Tricuspid valve regurgitation is mild to moderate. No evidence of tricuspid stenosis.  Aortic Valve: 21 mm Edwards 3000 TFX Perimount Magna Ease is present. There is mild paravalvular leak along the intra-valvular fibrosa. There is severe aortic regurgitation that is very eccentric and appears to be related to prolapse. There is holodiastolic flow reversal by Spectral Doppler. The aortic valve has been repaired/replaced. Aortic valve regurgitation is severe. Aortic valve mean gradient measures 22.0 mmHg. Aortic valve peak gradient measures 35.8 mmHg. Aortic valve area, by VTI measures 0.94 cm. There is a valve present in the aortic position. Procedure Date: 2008.  Pulmonic Valve: The pulmonic valve was grossly normal. Pulmonic valve regurgitation is not visualized.  Aorta: The aortic root, ascending aorta, aortic arch and descending aorta are all structurally normal, with no evidence of dilitation or obstruction. There is mild (Grade II) plaque involving the ascending aorta, aortic arch and descending aorta.  IAS/Shunts: No atrial level shunt detected by color flow Doppler.   LEFT VENTRICLE PLAX 2D LVOT diam:     1.70 cm LV SV:         70 LV SV Index:   38 LVOT Area:     2.27 cm   AORTIC VALVE AV Area (Vmax):    1.00 cm AV Area (Vmean):   0.89 cm AV Area (VTI):     0.94 cm AV Vmax:           299.00 cm/s AV Vmean:           218.000 cm/s AV VTI:            0.746 m AV Peak Grad:      35.8 mmHg AV Mean Grad:      22.0 mmHg LVOT Vmax:         132.00 cm/s LVOT Vmean:        85.100 cm/s LVOT VTI:          0.308 m LVOT/AV VTI ratio: 0.41  AORTA Ao Asc diam: 2.30 cm   SHUNTS Systemic VTI:  0.31 m Systemic Diam: 1.70 cm  Riley Lam MD Electronically signed by Riley Lam MD Signature Date/Time: 03/04/2023/5:50:42 PM    Final    CT SCANS  CT CORONARY MORPH W/CTA COR W/SCORE 03/03/2023  Addendum 03/03/2023 12:18 PM ADDENDUM REPORT: 03/03/2023 12:15  CLINICAL DATA:  Pre Valve in Valve TAVR  EXAM: Cardiac TAVR CT  TECHNIQUE: The patient was scanned on a Siemens Force 192 slice scanner. A 120 kV retrospective scan was triggered in the descending thoracic aorta at 111 HU's. Gantry rotation speed was 270 msecs and collimation was .9 mm. No beta blockade or nitro were given. The 3D data set was reconstructed in 5% intervals of the R-R cycle. Systolic and diastolic phases were analyzed on a dedicated work station using MPR, MIP and VRT modes. The patient received 80 cc of contrast.  FINDINGS: Aortic Valve: Has been replaced in 2008. 21 mm Edwards 3000 TFX Perimount Magna stented bioprosthetic valve The stent ID is 20 mm the valve height is 15 mm  And the True ID is 19 mm The leaflets are thickened with mild calcification there is some prolapse of the non coronary cusp. There is a possible area of annular dehiscence near the left cusp measuring 4.6 mm x 3.7 mm with area of 0.15 cm2. Inferior to this in the LVOT is a large area of calcium at the base of the anterior mitral leaflet and intervalvular fibrosa.  Aorta: There appears to be a straight graft repair of ascending thoracic aorta above the AVR Suture lines intact with no aneurysm  Sinotubular Junction: 28 mm  Ascending Thoracic Aorta: 29.5 mm  Aortic Arch: 31 nn  Descending Thoracic Aorta: Very tortuous 25  mm  Sinus of Valsalva Measurements:  Non-coronary: 33.3 mm  Right - coronary: 30.6 mm  Left - coronary: 32 mm  Coronary Artery Height above Annulus:  Left Main: 20.3 mm above annulus  Right Coronary: 17.5 mm above annulus  Coronary Arteries: Sufficient height above annulus for deployment The coronary arteries arise aboe the embedded geometry of the valve and  do  Not appear to be at risk for occlusion  Optimum Fluoroscopic Angle for Delivery: RAO 7 Caudal 13 degrees  Without balloon fracture a 20 mm Sapien 3 valve appears to fit the ID of the valve better If the valve is dilated with a 22 balloon possible use of 23 Sapien valve  IMPRESSION: 1. 77 yo Edwards 3000 TFX Perimount Magna Valve with thickened leaflets and mild calcium. 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 Also concern for area of PVL or annular dehiscence near the base of the left cusp with calcified intervalvular fibrosa inferior to this Patient will need TEE to further assess if his severe AR is central/PVL or combination of both  2. Coronary arteries quite high and not at risk for occlusion with valve in valve procedure being above simulated valve implant with Sapien  3. Valve in Valve app suggests using 20 mm Sapien vs fracture with 22 balloon and using a 23 mm Sapien. Embedded geometry confirms that to fit a 23 mm valve the annulus would need balloon fracture  4. Intact straight graft repair of the ascending thoracic aorta with no residual aneurysm  5. Optimum angiographic angle for deployment RAO 7 Caudal 13 degrees with aortic root contrast injection to try and visualize hinge points for leaflets  Charlton Haws   Electronically Signed By: Charlton Haws M.D. On: 03/03/2023 12:15  Narrative EXAM: OVER-READ INTERPRETATION  CT CHEST  The following report is a limited chest CT over-read performed by radiologist Dr. Allegra Lai of [facility] on 03/03/2023. This over-read does  not include interpretation of cardiac or coronary anatomy or pathology. The cardiac TAVR interpretation by the cardiologist is attached.  COMPARISON:  None Available.  FINDINGS: Extracardiac findings will be described separately under dictation for contemporaneously obtained CTA chest, abdomen and pelvis.  IMPRESSION: Please see separate dictation for contemporaneously obtained CTA chest, abdomen and pelvis dated 03/03/2023 for full description of relevant extracardiac findings.  Electronically Signed: By: Allegra Lai M.D. On: 03/03/2023 11:22          EKG:  EKG is NSR with 1st degree AV block, HR 61bpm.    Recent Labs: 02/27/2023: B Natriuretic Peptide 744.7 02/28/2023: ALT 39; TSH 1.442 03/16/2023: BUN 15; Creatinine, Ser 1.06; Hemoglobin 9.5; Magnesium 1.7; Platelets 117; Potassium 3.5; Sodium 132   Recent Lipid Panel No results found for: "CHOL", "TRIG", "HDL", "CHOLHDL", "VLDL", "LDLCALC", "LDLDIRECT"  Physical Exam:    VS:  BP 110/62   Pulse 61   Ht 5\' 7"  (1.702 m)   Wt 148 lb 6.4 oz (67.3 kg)   SpO2 98%   BMI 23.24 kg/m     Wt Readings from Last 3 Encounters:  03/21/23 148 lb 6.4 oz (67.3 kg)  03/16/23 145 lb 8.1 oz (66 kg)  03/05/23 160 lb 15 oz (73 kg)    General: Well developed, well nourished, NAD Lungs:Clear to ausculation bilaterally. No wheezes, rales, or rhonchi. Breathing is unlabored. Cardiovascular: RRR with S1 S2. Soft flow murmurs Extremities: No edema. Groin sites stable.  Neuro: Alert and oriented. No focal deficits. No facial asymmetry. MAE spontaneously. Psych: Responds to questions appropriately with normal affect.    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 Clifton James & Laneta Simmers 03/15/23. Groin sites remain stable. 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 He is stable for CRII however unclear if this patient is  interested  at this time. No need for dental SBE given recent extractions. He was treated with Amoxicillin post extraction by his oral surgeon. Continue ASA monotherapy. See diuretic plan below. Plan one month follow up with echo.   Acute on chronic HFpEF: Treated with IV Lasix and transitioned to PO Lasix 40mg  daily. Having issues with frequent nocturnal enuresis and weight loss. This is very troublesome for him. Appears euvolemic on exam today. Plan to decrease Lasix to 20-40mg  PRN for weight gain greater than 3lb in one day or 5lb in one week with plans to follow at one month post TAVR appointment. If weight gain or symptoms of volume overload, would plan Lasix 20mg  daily.   CAD: LHC 02/04/23 with Dr. Ivan Croft which showed a 55% proximal diagonal lesion, 30% mid RCA lesion and otherwise no significant obstruction. Denies anginal symptoms. Continue medical therapy.     HTN: Stable with no changes needed at this time.    Pulmonary nodule: Noted on pre TAVR CT. Discussed today with his wife and patient. Reports that his PCP is following this with followed up testing ordered. He is a prior smoker. I do not see any testing ordered today however will re-evaluate at follow up to make sure this has been done.     Medication Adjustments/Labs and Tests Ordered: Current medicines are reviewed at length with the patient today.  Concerns regarding medicines are outlined above.  Orders Placed This Encounter  Procedures   EKG 12-Lead   Meds ordered this encounter  Medications   furosemide (LASIX) 40 MG tablet    Sig: Take 0.5-1 tablets (20-40 mg total) by mouth daily.    Dispense:  30 tablet    Refill:  5    Patient Instructions  Medication Instructions:  Your physician has recommended you make the following change in your medication:  INCREASE LASIX TO 20-40 MG AS NEEDED  *If you need a refill on your cardiac medications before your next appointment, please call your pharmacy*   Lab Work: NONE If you have  labs (blood work) drawn today and your tests are completely normal, you will receive your results only by: MyChart Message (if you have MyChart) OR A paper copy in the mail If you have any lab test that is abnormal or we need to change your treatment, we will call you to review the results.   Testing/Procedures: EKG   Follow-Up: At [facility], you and your health needs are our priority.  As part of our continuing mission to provide you with exceptional heart care, we have created designated Provider Care Teams.  These Care Teams include your primary Cardiologist (physician) and Advanced Practice Providers (APPs -  Physician Assistants and Nurse Practitioners) who all work together to provide you with the care you need, when you need it.  We recommend signing up for the patient portal called "MyChart".  Sign up information is provided on this After Visit Summary.  MyChart is used to connect with patients for Virtual Visits (Telemedicine).  Patients are able to view lab/test results, encounter notes, upcoming appointments, etc.  Non-urgent messages can be sent to your provider as well.   To learn more about what you can do with MyChart, go to [email or web address].    Your next appointment:   KEEP SCHEDULED FOLLOW-UP   Signed, Georgie Chard, NP  03/22/2023 8:08 AM    [facility]

## 2023-03-21 ENCOUNTER — Ambulatory Visit: Payer: Medicare HMO | Attending: Cardiology | Admitting: Cardiology

## 2023-03-21 VITALS — BP 110/62 | HR 61 | Ht 67.0 in | Wt 148.4 lb

## 2023-03-21 DIAGNOSIS — I10 Essential (primary) hypertension: Secondary | ICD-10-CM

## 2023-03-21 DIAGNOSIS — Z954 Presence of other heart-valve replacement: Secondary | ICD-10-CM

## 2023-03-21 DIAGNOSIS — I2510 Atherosclerotic heart disease of native coronary artery without angina pectoris: Secondary | ICD-10-CM

## 2023-03-21 DIAGNOSIS — I5033 Acute on chronic diastolic (congestive) heart failure: Secondary | ICD-10-CM | POA: Diagnosis not present

## 2023-03-21 DIAGNOSIS — Z952 Presence of prosthetic heart valve: Secondary | ICD-10-CM

## 2023-03-21 DIAGNOSIS — I351 Nonrheumatic aortic (valve) insufficiency: Secondary | ICD-10-CM

## 2023-03-21 DIAGNOSIS — R918 Other nonspecific abnormal finding of lung field: Secondary | ICD-10-CM

## 2023-03-21 MED ORDER — FUROSEMIDE 40 MG PO TABS: 20.0000 mg | ORAL_TABLET | Freq: Every day | ORAL | 5 refills | Status: AC

## 2023-03-21 NOTE — Patient Instructions (Signed)
Medication Instructions:  Your physician has recommended you make the following change in your medication:  INCREASE LASIX TO 20-40 MG AS NEEDED  *If you need a refill on your cardiac medications before your next appointment, please call your pharmacy*   Lab Work: NONE If you have labs (blood work) drawn today and your tests are completely normal, you will receive your results only by: MyChart Message (if you have MyChart) OR A paper copy in the mail If you have any lab test that is abnormal or we need to change your treatment, we will call you to review the results.   Testing/Procedures: EKG   Follow-Up: At [facility], you and your health needs are our priority.  As part of our continuing mission to provide you with exceptional heart care, we have created designated Provider Care Teams.  These Care Teams include your primary Cardiologist (physician) and Advanced Practice Providers (APPs -  Physician Assistants and Nurse Practitioners) who all work together to provide you with the care you need, when you need it.  We recommend signing up for the patient portal called "MyChart".  Sign up information is provided on this After Visit Summary.  MyChart is used to connect with patients for Virtual Visits (Telemedicine).  Patients are able to view lab/test results, encounter notes, upcoming appointments, etc.  Non-urgent messages can be sent to your provider as well.   To learn more about what you can do with MyChart, go to [email or web address].    Your next appointment:   KEEP SCHEDULED FOLLOW-UP

## 2023-03-22 ENCOUNTER — Encounter (HOSPITAL_BASED_OUTPATIENT_CLINIC_OR_DEPARTMENT_OTHER): Payer: Medicare HMO

## 2023-04-01 ENCOUNTER — Encounter (HOSPITAL_BASED_OUTPATIENT_CLINIC_OR_DEPARTMENT_OTHER): Payer: Self-pay

## 2023-04-05 ENCOUNTER — Encounter (HOSPITAL_BASED_OUTPATIENT_CLINIC_OR_DEPARTMENT_OTHER): Payer: Medicare HMO

## 2023-04-05 ENCOUNTER — Ambulatory Visit: Payer: Medicare HMO | Admitting: Internal Medicine

## 2023-04-05 ENCOUNTER — Encounter (HOSPITAL_BASED_OUTPATIENT_CLINIC_OR_DEPARTMENT_OTHER): Payer: Self-pay

## 2023-04-11 ENCOUNTER — Ambulatory Visit (HOSPITAL_COMMUNITY): Payer: Medicare HMO | Attending: Cardiology

## 2023-04-11 ENCOUNTER — Ambulatory Visit (INDEPENDENT_AMBULATORY_CARE_PROVIDER_SITE_OTHER): Payer: Medicare HMO | Admitting: Cardiology

## 2023-04-11 VITALS — BP 110/50 | HR 58 | Ht 67.0 in | Wt 154.2 lb

## 2023-04-11 DIAGNOSIS — I2510 Atherosclerotic heart disease of native coronary artery without angina pectoris: Secondary | ICD-10-CM

## 2023-04-11 DIAGNOSIS — I351 Nonrheumatic aortic (valve) insufficiency: Secondary | ICD-10-CM

## 2023-04-11 DIAGNOSIS — R918 Other nonspecific abnormal finding of lung field: Secondary | ICD-10-CM | POA: Diagnosis not present

## 2023-04-11 DIAGNOSIS — I10 Essential (primary) hypertension: Secondary | ICD-10-CM

## 2023-04-11 DIAGNOSIS — Z952 Presence of prosthetic heart valve: Secondary | ICD-10-CM | POA: Diagnosis not present

## 2023-04-11 LAB — ECHOCARDIOGRAM COMPLETE
AR max vel: 1.62 cm2
AV Area VTI: 1.43 cm2
AV Area mean vel: 1.46 cm2
AV Mean grad: 11 mmHg
AV Peak grad: 20.3 mmHg
Ao pk vel: 2.25 m/s
Area-P 1/2: 2.43 cm2
S' Lateral: 2.7 cm

## 2023-04-11 NOTE — Progress Notes (Signed)
[facility]   MULTIDISCIPLINARY HEART VALVE CLINIC                                     Cardiology Office Note:    Date:  04/11/2023   ID:  Randy Duncan, DOB Dec 30, 1945, MRN [id number]  PCP:  Tracey Harries, MD  [facility] Cardiologist:  Charlton Haws, MD / Dr. Clifton James & Dr. Laneta Simmers (TAVR)  [facility] Electrophysiologist:  None   Referring MD: Tracey Harries, MD   Chief Complaint  Patient presents with   Follow-up    1 month s/p TAVR   History of Present Illness:    Randy Duncan is a 77 y.o. 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 He underwent successful ViV 03/15/23 and is being seen today for one month follow up.    Randy Duncan has a history of an ascending aorta repair and AVR with a 21 mm Edwards Magna stented bioprosthetic valve in 2008 at [place] with Dr. Lequita Halt. He has done well and remained active since that time. Echo 11/11/22 showed EF 65% and normally functioning prosthetic valve well seated with a mean gradient of 11 mmHg and no reported AR.   In early May he developed general malaise, shortness of breath, palpitations, and exercise intolerance. CT angio of chest 01/26/23 with no PE, acute infiltrate or effusion. Nuclear stress test 01/28/23 was borderline for demonstration of inducible ischemia in the inferior region. The patient subsequently had a cardiac catheterization on 02/04/23 with Dr. Ivan Croft which showed a 55% proximal diagonal lesion, 30% mid RCA lesion and otherwise no significant obstruction, but did show an elevated left ventricular end-diastolic pressure with hypertension. Of note, the first diagonal lesion was thought to be 90% obstructed on prior cardiac  catheterization in 2020 but now reduced to 50 to 60%. He then underwent outpatient titration of BP meds (several medication intolerances noted).    He was then followed by [facility] and his PCP for symptoms of worsening exercise intolerance, dyspnea, and palpitations. Given recent reassuring cardiac/pulmonary testing, PFTs and referral to pulm/endocrinology were ordered.   He then presented to [facility] ED on 02/27/23 for evaluation of fever and cough, chest pain as well as chronic polyuria and polydipsia. Found to have an elevated BNP and pulmonary vascular congestion on CXR and CTA. Echo 02/28/23 showed EF 60-65%, mild RVE, mild MR, mild to mod TR, mild AS (mean gradient 20mm hg, AVA 1.46cm2) with severe bioprosthetic AR with a PHT of . Subsequent TEE 03/04/23 confirmed severe central AI. He was discharged home on 03/05/23 with plans for outpatient VIV TAVR 03/15/23.    Given poor dentition, he had multiple dental extractions on 03/10/27 and developed severe dizziness and was re-admitted prior to his procedure.    He is now s/p successful VIV TAVR via the TF approach by Dr. Clifton James & Laneta Simmers. POD 1 echo with stable valve function and no PVL or central AI with 23mm S3UR with a mean gradient at , AVA by VTI 1.47cm2, and DI 0.52.   In TOC follow up, he was having issues with frequent nocturia and weight loss related to  his Lasix regimen. Lasix was decreased to 20-40mg  PRN. Today he reports nocturia has resolved since he was last seen. Weight has improved as well. He is asking about returning to playing golf three days per week. He has been avoiding this due to the heat. Wants to start walking again as well. He denies chest pain, SOB, palpitations, LE edema, orthopnea, PND, dizziness, or syncope. Denies bleeding in stool or urine.    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 Laneta Simmers   Sleep disorder    Snoring     Past Surgical History:  Procedure Laterality Date   AORTIC VALVE REPAIR  11/2006   CATARACT EXTRACTION Bilateral 2010   INTRAOPERATIVE TRANSTHORACIC ECHOCARDIOGRAM N/A 03/15/2023   Procedure: INTRAOPERATIVE TRANSTHORACIC ECHOCARDIOGRAM;  Surgeon: Kathleene Hazel, MD;  Location: [facility] INVASIVE CV LAB;  Service: Open Heart Surgery;  Laterality: N/A;   TEE WITHOUT CARDIOVERSION N/A 03/04/2023   Procedure: TRANSESOPHAGEAL ECHOCARDIOGRAM;  Surgeon: Christell Constant, MD;  Location: [facility] INVASIVE CV LAB;  Service: Cardiovascular;  Laterality: N/A;   TRANSCATHETER AORTIC VALVE REPLACEMENT, TRANSFEMORAL N/A 03/15/2023   Procedure: Transcatheter Aortic Valve Replacement, Transfemoral;  Surgeon: Kathleene Hazel, MD;  Location: [facility] INVASIVE CV LAB;  Service: Open Heart Surgery;  Laterality: N/A;   TUMOR EXCISION     Left parotid gland (done in [employer])    Current Medications: Current Meds  Medication Sig   acetaminophen (TYLENOL) 500 MG tablet Take 1,000 mg by mouth as needed for mild pain or moderate pain.   aspirin EC 81 MG tablet Take 81 mg by mouth in the morning. Swallow whole.   atorvastatin (LIPITOR) 80 MG tablet Take 80 mg by mouth at bedtime.   Cholecalciferol (VITAMIN D3) 125 MCG (5000 UT) CAPS Take 5,000 Units by mouth in the morning.   clonazePAM (KLONOPIN) 0.5 MG tablet Take 0.25 mg by mouth every 4 (four) hours.   furosemide (LASIX) 40 MG tablet Take 20-40 mg by mouth as needed for fluid or edema.   hydrALAZINE (APRESOLINE) 25 MG tablet Take 25 mg by mouth in the morning and at bedtime.   isosorbide mononitrate (IMDUR) 30 MG 24 hr tablet Take 0.5 tablets (15 mg total) by mouth daily.   metoprolol succinate (TOPROL-XL)  25 MG 24 hr tablet Take 12.5 mg by mouth at bedtime.   ranolazine (RANEXA) 1000 MG SR tablet Take 1,000 mg by mouth in the morning and at bedtime.   ZYRTEC ALLERGY 10 MG tablet Take 10 mg by mouth See admin instructions. Take 10 mg by mouth in the evening-bedtime, along with the 2nd daily dose of Hydralazine     Allergies:   Losartan potassium, Wheat, Naproxen, Amlodipine, Coreg [carvedilol], Hydralazine, Lisinopril, Bupropion, and Cephalexin   Social History   Socioeconomic History   Marital status: Married    Spouse name: Harriett Sine   Number of children: 3   Years of education: 14   Highest education level: Not on file  Occupational History    Employer: [employer]  Tobacco Use   Smoking status: Former    Current packs/day: 1.00    Average packs/day: 1 pack/day for 40.0  years (40.0 ttl pk-yrs)    Types: Cigarettes   Smokeless tobacco: Never   Tobacco comments:    quit 02/2012  Vaping Use   Vaping status: Never Used  Substance and Sexual Activity   Alcohol use: No    Comment: quit in 2006   Drug use: No   Sexual activity: Not on file  Other Topics Concern   Not on file  Social History Narrative   Patient is right handed, consumes 1 soda daily,resides with wife and 2 adult children.   Social Determinants of Health   Financial Resource Strain: Medium Risk (10/03/2022)   Received from [facility], [facility]   Overall Financial Resource Strain (CARDIA)    Difficulty of Paying Living Expenses: Somewhat hard  Food Insecurity: No Food Insecurity (03/14/2023)   Hunger Vital Sign    Worried About Running Out of Food in the Last Year: Never true    Ran Out of Food in the Last Year: Never true  Transportation Needs: No Transportation Needs (03/14/2023)   PRAPARE - Administrator, Civil Service (Medical): No    Lack of Transportation (Non-Medical): No  Physical Activity: Sufficiently Active (10/03/2022)   Received from [facility], [facility]   Exercise Vital Sign     Days of Exercise per Week: 3 days    Minutes of Exercise per Session: 150+ min  Stress: No Stress Concern Present (02/04/2023)   Received from [facility], [facility] of Occupational Health - Occupational Stress Questionnaire    Feeling of Stress : Not at all  Social Connections: Somewhat Isolated (10/03/2022)   Received from [facility], [facility]   Social Network    How would you rate your social network (family, work, friends)?: Restricted participation with some degree of social isolation    Family History: [name] family history includes Celiac disease in his daughter; Emphysema in his maternal grandfather. There is no history of Lung disease.  ROS:   Please see the history of present illness.    All other systems reviewed and are negative.  EKGs/Labs/Other Studies Reviewed:    The following studies were reviewed today:  Cardiac Studies & Procedures       ECHOCARDIOGRAM  ECHOCARDIOGRAM COMPLETE 03/16/2023  Narrative ECHOCARDIOGRAM REPORT    Patient Name:   Randy Duncan Date of Exam: 03/16/2023 Medical Rec #:  [id number]        Height:       67.0 in Accession #:    [id number]       Weight:       145.5 lb Date of Birth:  06/21/1946       BSA:          1.767 m Patient Age:    60 years         BP:           119/63 mmHg Patient Gender: M                HR:           65 bpm. Exam Location:  Inpatient  Procedure: Color Doppler and Cardiac Doppler  Indications:    s/p TAVR  History:        Patient has prior history of Echocardiogram examinations, most recent 03/15/2023. CAD, Aortic Valve Disease and s/p TAVR; Risk Factors:Hypertension and Sleep Apnea.  Sonographer:    Milbert Coulter Referring Phys: [phone] Rhonin Trott D Kiril Hippe  IMPRESSIONS   1. Left ventricular  ejection fraction, by estimation, is 60 to 65%. The left ventricle has normal function. The left ventricle has no regional wall motion abnormalities. Left ventricular diastolic  parameters are consistent with Grade I diastolic dysfunction (impaired relaxation). 2. Right ventricular systolic function is normal. The right ventricular size is normal. 3. The mitral valve is degenerative. No evidence of mitral valve regurgitation. The mean mitral valve gradient is 2.0 mmHg with average heart rate of 63 bpm. 4. DVI 0.52. No PVL or central AI s/p Valve in Valve with 23 mm Sapien TAVR. The aortic valve has been repaired/replaced. Aortic valve regurgitation is not visualized. Echo findings are consistent with normal structure and function of the aortic valve prosthesis. Aortic valve area, by VTI measures 1.47 cm. Aortic valve mean gradient measures 12.0 mmHg.  Comparison(s): Prior images reviewed side by side. Echo findings are consistent with normal structure and function of the aortic valve prosthesis.  FINDINGS Left Ventricle: Left ventricular ejection fraction, by estimation, is 60 to 65%. The left ventricle has normal function. The left ventricle has no regional wall motion abnormalities. The left ventricular internal cavity size was normal in size. There is no left ventricular hypertrophy. Left ventricular diastolic parameters are consistent with Grade I diastolic dysfunction (impaired relaxation).  Right Ventricle: The right ventricular size is normal. No increase in right ventricular wall thickness. Right ventricular systolic function is normal. The tricuspid regurgitant velocity is 2.75 m/s, and with an assumed right atrial pressure of 3 mmHg, the estimated right ventricular systolic pressure is 33.2 mmHg.  Left Atrium: Left atrial size was normal in size.  Right Atrium: Right atrial size was normal in size.  Pericardium: There is no evidence of pericardial effusion.  Mitral Valve: The mitral valve is degenerative in appearance. No evidence of mitral valve regurgitation. The mean mitral valve gradient is 2.0 mmHg with average heart rate of 63 bpm.  Tricuspid Valve:  The tricuspid valve is normal in structure. Tricuspid valve regurgitation is mild . No evidence of tricuspid stenosis.  Aortic Valve: DVI 0.52. No PVL or central AI s/p Valve in Valve with 23 mm Sapien TAVR. The aortic valve has been repaired/replaced. Aortic valve regurgitation is not visualized. Aortic valve mean gradient measures 12.0 mmHg. Aortic valve peak gradient measures 22.1 mmHg. Aortic valve area, by VTI measures 1.47 cm. There is a 23 mm Sapien prosthetic, stented (TAVR) valve present in the aortic position. Echo findings are consistent with normal structure and function of the aortic valve prosthesis.  Pulmonic Valve: The pulmonic valve was normal in structure. Pulmonic valve regurgitation is not visualized. No evidence of pulmonic stenosis.  Aorta: The aortic root and ascending aorta are structurally normal, with no evidence of dilitation.  IAS/Shunts: No atrial level shunt detected by color flow Doppler.   LEFT VENTRICLE PLAX 2D LVIDd:         4.60 cm     Diastology LVIDs:         2.70 cm     LV e' medial:    10.70 cm/s LV PW:         1.00 cm     LV E/e' medial:  9.7 LV IVS:        1.00 cm     LV e' lateral:   12.70 cm/s LVOT diam:     1.90 cm     LV E/e' lateral: 8.2 LV SV:         78 LV SV Index:   44 LVOT Area:  2.84 cm  LV Volumes (MOD) LV vol d, MOD A2C: 68.2 ml LV vol d, MOD A4C: 87.2 ml LV vol s, MOD A2C: 17.9 ml LV vol s, MOD A4C: 28.3 ml LV SV MOD A2C:     50.3 ml LV SV MOD A4C:     87.2 ml LV SV MOD BP:      53.9 ml  RIGHT VENTRICLE RV Basal diam:  4.00 cm RV Mid diam:    2.80 cm RV S prime:     15.40 cm/s TAPSE (M-mode): 2.0 cm  LEFT ATRIUM             Index        RIGHT ATRIUM           Index LA diam:        3.90 cm 2.21 cm/m   RA Area:     15.70 cm LA Vol (A2C):   56.1 ml 31.76 ml/m  RA Volume:   40.00 ml  22.64 ml/m LA Vol (A4C):   39.3 ml 22.25 ml/m LA Biplane Vol: 51.3 ml 29.04 ml/m AORTIC VALVE AV Area (Vmax):    1.50 cm AV Area  (Vmean):   1.44 cm AV Area (VTI):     1.47 cm AV Vmax:           235.00 cm/s AV Vmean:          160.000 cm/s AV VTI:            0.533 m AV Peak Grad:      22.1 mmHg AV Mean Grad:      12.0 mmHg LVOT Vmax:         124.00 cm/s LVOT Vmean:        81.200 cm/s LVOT VTI:          0.276 m LVOT/AV VTI ratio: 0.52  AORTA Ao Root diam: 2.80 cm Ao Asc diam:  2.90 cm  MITRAL VALVE                TRICUSPID VALVE MV Area (PHT): 2.91 cm     TR Peak grad:   30.2 mmHg MV Mean grad:  2.0 mmHg     TR Vmax:        275.00 cm/s MV Decel Time: 261 msec MV E velocity: 104.00 cm/s  SHUNTS MV A velocity: 91.70 cm/s   Systemic VTI:  0.28 m MV E/A ratio:  1.13         Systemic Diam: 1.90 cm  Riley Lam MD Electronically signed by Riley Lam MD Signature Date/Time: 03/16/2023/4:11:23 PM    Final   TEE  ECHO TEE 03/04/2023  Narrative TRANSESOPHOGEAL ECHO REPORT    Patient Name:   Randy Duncan Date of Exam: 03/04/2023 Medical Rec #:  [id number]        Height:       67.0 in Accession #:    [id number]       Weight:       160.7 lb Date of Birth:  [birth year]       BSA:          1.843 m Patient Age:    76 years         BP:           99/44 mmHg Patient Gender: M                HR:           66 bpm. Exam Location:  Inpatient  Procedure: Transesophageal Echo, Cardiac Doppler, Color Doppler and 3D Echo  Indications:     aortic regurgitation  History:         Patient has prior history of Echocardiogram examinations, most recent 02/28/2023. CHF, CAD, ascending aorta repair; Risk Factors:Hypertension, Diabetes and Former Smoker. Aortic Valve: valve is present in the aortic position. Procedure Date: 2008.  Sonographer:     Delcie Roch RDCS Referring Phys:  [id number] Roe Rutherford [facility] Diagnosing Phys: Riley Lam MD  PROCEDURE: After discussion of the risks and benefits of a TEE, an informed consent was obtained from the patient. The transesophogeal probe was  passed without difficulty through the esophogus of the patient. Imaged were obtained with the patient in a left lateral decubitus position. Sedation performed by different physician. The patient was monitored while under deep sedation. Anesthestetic sedation was provided intravenously by Anesthesiology: 288mg  of Propofol, 100mg  of Lidocaine. The patient developed no complications during the procedure.  IMPRESSIONS   1. 21 mm Edwards 3000 TFX Perimount Magna Ease is present. There is mild paravalvular leak along the intra-valvular fibrosa. There is severe aortic regurgitation that is very eccentric and appears to be related to prolapse. There is holodiastolic flow reversal by Spectral Doppler. The aortic valve has been repaired/replaced. Aortic valve regurgitation is severe. There is a valve present in the aortic position. Procedure Date: 2008. Echo findings are consistent with stenosis and regurgitation of the aortic prosthesis. Aortic valve mean gradient measures 22.0 mmHg. Aortic valve Vmax measures 2.99 m/s. Aortic valve acceleration time measures 104 msec. 2. There is an intra-atrial membrane that attaches on the mid inferior portion of the septum and terminates in the posterior lateral portion of the right atrium. It does not cause turbulent color Doppler flow. No left atrial/left atrial appendage thrombus was detected. 3. Left ventricular ejection fraction, by estimation, is 60 to 65%. The left ventricle has normal function. 4. Right ventricular systolic function is normal. The right ventricular size is moderately enlarged. 5. The mitral valve is degenerative. Mild to moderate mitral valve regurgitation. No evidence of mitral stenosis. 6. Tricuspid valve regurgitation is mild to moderate. 7. There is mild (Grade II) plaque involving the ascending aorta, aortic arch and descending aorta.  FINDINGS Left Ventricle: Left ventricular ejection fraction, by estimation, is 60 to 65%. The left  ventricle has normal function. The left ventricular internal cavity size was normal in size.  Right Ventricle: The right ventricular size is moderately enlarged. Right vetricular wall thickness was not well visualized. Right ventricular systolic function is normal.  Left Atrium: There is an intra-atrial membrane that attaches on the mid inferior portion of the septum and terminates in the posterior lateral portion of the right atrium. It does not cause turbulent color Doppler flow. Left atrial size was normal in size. No left atrial/left atrial appendage thrombus was detected.  Right Atrium: Right atrial size was normal in size.  Pericardium: Trivial pericardial effusion is present. The pericardial effusion is circumferential.  Mitral Valve: The mitral valve is degenerative in appearance. Mild to moderate mitral valve regurgitation. No evidence of mitral valve stenosis.  Tricuspid Valve: The tricuspid valve is normal in structure. Tricuspid valve regurgitation is mild to moderate. No evidence of tricuspid stenosis.  Aortic Valve: 21 mm Edwards 3000 TFX Perimount Magna Ease is present. There is mild paravalvular leak along the intra-valvular fibrosa. There is severe aortic regurgitation that is very eccentric and appears to be related to prolapse. There is holodiastolic flow reversal by Spectral  Doppler. The aortic valve has been repaired/replaced. Aortic valve regurgitation is severe. Aortic valve mean gradient measures 22.0 mmHg. Aortic valve peak gradient measures 35.8 mmHg. Aortic valve area, by VTI measures 0.94 cm. There is a valve present in the aortic position. Procedure Date: 2008.  Pulmonic Valve: The pulmonic valve was grossly normal. Pulmonic valve regurgitation is not visualized.  Aorta: The aortic root, ascending aorta, aortic arch and descending aorta are all structurally normal, with no evidence of dilitation or obstruction. There is mild (Grade II) plaque involving the ascending  aorta, aortic arch and descending aorta.  IAS/Shunts: No atrial level shunt detected by color flow Doppler.   LEFT VENTRICLE PLAX 2D LVOT diam:     1.70 cm LV SV:         70 LV SV Index:   38 LVOT Area:     2.27 cm   AORTIC VALVE AV Area (Vmax):    1.00 cm AV Area (Vmean):   0.89 cm AV Area (VTI):     0.94 cm AV Vmax:           299.00 cm/s AV Vmean:          218.000 cm/s AV VTI:            0.746 m AV Peak Grad:      35.8 mmHg AV Mean Grad:      22.0 mmHg LVOT Vmax:         132.00 cm/s LVOT Vmean:        85.100 cm/s LVOT VTI:          0.308 m LVOT/AV VTI ratio: 0.41  AORTA Ao Asc diam: 2.30 cm   SHUNTS Systemic VTI:  0.31 m Systemic Diam: 1.70 cm  Riley Lam MD Electronically signed by Riley Lam MD Signature Date/Time: 03/04/2023/5:50:42 PM    Final    CT SCANS  CT CORONARY MORPH W/CTA COR W/SCORE 03/03/2023  Addendum 03/03/2023 12:18 PM ADDENDUM REPORT: 03/03/2023 12:15  CLINICAL DATA:  Pre Valve in Valve TAVR  EXAM: Cardiac TAVR CT  TECHNIQUE: The patient was scanned on a Siemens Force 192 slice scanner. A 120 kV retrospective scan was triggered in the descending thoracic aorta at 111 HU's. Gantry rotation speed was 270 msecs and collimation was .9 mm. No beta blockade or nitro were given. The 3D data set was reconstructed in 5% intervals of the R-R cycle. Systolic and diastolic phases were analyzed on a dedicated work station using MPR, MIP and VRT modes. The patient received 80 cc of contrast.  FINDINGS: Aortic Valve: Has been replaced in 2008. 21 mm Edwards 3000 TFX Perimount Magna stented bioprosthetic valve The stent ID is 20 mm the valve height is 15 mm  And the True ID is 19 mm The leaflets are thickened with mild calcification there is some prolapse of the non coronary cusp. There is a possible area of annular dehiscence near the left cusp measuring 4.6 mm x 3.7 mm with area of 0.15 cm2. Inferior to this in the  LVOT is a large area of calcium at the base of the anterior mitral leaflet and intervalvular fibrosa.  Aorta: There appears to be a straight graft repair of ascending thoracic aorta above the AVR Suture lines intact with no aneurysm  Sinotubular Junction: 28 mm  Ascending Thoracic Aorta: 29.5 mm  Aortic Arch: 31 nn  Descending Thoracic Aorta: Very tortuous 25 mm  Sinus of Valsalva Measurements:  Non-coronary: 33.3 mm  Right - coronary: 30.6 mm  Left - coronary: 32 mm  Coronary Artery Height above Annulus:  Left Main: 20.3 mm above annulus  Right Coronary: 17.5 mm above annulus  Coronary Arteries: Sufficient height above annulus for deployment The coronary arteries arise aboe the embedded geometry of the valve and do  Not appear to be at risk for occlusion  Optimum Fluoroscopic Angle for Delivery: RAO 7 Caudal 13 degrees  Without balloon fracture a 20 mm Sapien 3 valve appears to fit the ID of the valve better If the valve is dilated with a 22 balloon possible use of 23 Sapien valve  IMPRESSION: 1. 77 yo Edwards 3000 TFX Perimount Magna Valve with thickened leaflets and mild calcium. 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 Also concern for area of PVL or annular dehiscence near the base of the left cusp with calcified intervalvular fibrosa inferior to this Patient will need TEE to further assess if his severe AR is central/PVL or combination of both  2. Coronary arteries quite high and not at risk for occlusion with valve in valve procedure being above simulated valve implant with Sapien  3. Valve in Valve app suggests using 20 mm Sapien vs fracture with 22 balloon and using a 23 mm Sapien. Embedded geometry confirms that to fit a 23 mm valve the annulus would need balloon fracture  4. Intact straight graft repair of the ascending thoracic aorta with no residual aneurysm  5. Optimum angiographic angle for deployment RAO 7 Caudal 13 degrees with aortic root  contrast injection to try and visualize hinge points for leaflets  Charlton Haws   Electronically Signed By: Charlton Haws M.D. On: 03/03/2023 12:15  Narrative EXAM: OVER-READ INTERPRETATION  CT CHEST  The following report is a limited chest CT over-read performed by radiologist Dr. Allegra Lai of [facility] on 03/03/2023. This over-read does not include interpretation of cardiac or coronary anatomy or pathology. The cardiac TAVR interpretation by the cardiologist is attached.  COMPARISON:  None Available.  FINDINGS: Extracardiac findings will be described separately under dictation for contemporaneously obtained CTA chest, abdomen and pelvis.  IMPRESSION: Please see separate dictation for contemporaneously obtained CTA chest, abdomen and pelvis dated 03/03/2023 for full description of relevant extracardiac findings.  Electronically Signed: By: Allegra Lai M.D. On: 03/03/2023 11:22         EKG:  EKG is not ordered today.    Recent Labs: 02/27/2023: B Natriuretic Peptide 744.7 02/28/2023: ALT 39; TSH 1.442 03/16/2023: BUN 15; Creatinine, Ser 1.06; Hemoglobin 9.5; Magnesium 1.7; Platelets 117; Potassium 3.5; Sodium 132   Recent Lipid Panel No results found for: "CHOL", "TRIG", "HDL", "CHOLHDL", "VLDL", "LDLCALC", "LDLDIRECT"  Physical Exam:    VS:  BP (!) 110/50   Pulse (!) 58   Ht 5\' 7"  (1.702 m)   Wt 154 lb 3.2 oz (69.9 kg)   SpO2 98%   BMI 24.15 kg/m     Wt Readings from Last 3 Encounters:  04/11/23 154 lb 3.2 oz (69.9 kg)  03/21/23 148 lb 6.4 oz (67.3 kg)  03/16/23 145 lb 8.1 oz (66 kg)    General: Well developed, well nourished, NAD Lungs:Clear to ausculation bilaterally. No wheezes, rales, or rhonchi. Breathing is unlabored. Cardiovascular: RRR with S1 S2. + murmur Extremities: No edema.  Neuro: Alert and oriented. No focal deficits. No facial asymmetry. MAE spontaneously. Psych: Responds to questions appropriately with normal  affect.    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 Clifton James & Laneta Simmers 03/15/23. Echo today with normal LVEF at 65-70% with stable valve function with a mean gradient at , peak 20.62mmHg, AVA by VTI at 1.43cm2, and DI at 0.47. No need for dental SBE given pre-op extractions. Continue ASA monotherapy. Plan one year follow up with echo and Dr. Eden Emms in the interm.    Chronic HFpEF: Previously treated with Lasix 40mg  however did not tolerate therefore this was reduced to 20-40mg  PRN and he has done well with no recent nocturia. Continue current regimen. Appears euvolemic on exam.    CAD: LHC 02/04/23 with Dr. Ivan Croft which showed a 55% proximal diagonal lesion, 30% mid RCA lesion and otherwise no significant obstruction. Denies anginal symptoms. Continue medical therapy.     HTN: Stable with no changes needed at this time.    Pulmonary nodule: Noted on pre TAVR CT. Ordered by PCP however testing was cancelled at the [facility] location due to staffing therefore order needs updating to change location to [facility]. Will message PCP regarding this.   Medication Adjustments/Labs and Tests Ordered: Current medicines are reviewed at length with the patient today.  Concerns regarding medicines are outlined above.  No orders of the defined types were placed in this encounter.  No orders of the defined types were placed in this encounter.   Patient Instructions  Medication Instructions:  Your physician recommends that you continue on your current medications as directed. Please refer to the Current Medication list given to you today.  *If you need a refill on your cardiac medications before your next appointment, please call your pharmacy*   Lab Work: NONE If you have labs (blood work) drawn today and your tests are completely normal, you will receive your results only  by: MyChart Message (if you have MyChart) OR A paper copy in the mail If you have any lab test that is abnormal or we need to change your treatment, we will call you to review the results.   Testing/Procedures: NONE   Follow-Up: At [facility], you and your health needs are our priority.  As part of our continuing mission to provide you with exceptional heart care, we have created designated Provider Care Teams.  These Care Teams include your primary Cardiologist (physician) and Advanced Practice Providers (APPs -  Physician Assistants and Nurse Practitioners) who all work together to provide you with the care you need, when you need it.  We recommend signing up for the patient portal called "MyChart".  Sign up information is provided on this After Visit Summary.  MyChart is used to connect with patients for Virtual Visits (Telemedicine).  Patients are able to view lab/test results, encounter notes, upcoming appointments, etc.  Non-urgent messages can be sent to your provider as well.   To learn more about what you can do with MyChart, go to [email or web address].    Your next appointment:   5-6 month(s)  Provider:   Charlton Haws, MD     Signed, Georgie Chard, NP  04/11/2023 10:30 AM    [facility]

## 2023-04-11 NOTE — Patient Instructions (Signed)
Medication Instructions:  Your physician recommends that you continue on your current medications as directed. Please refer to the Current Medication list given to you today.  *If you need a refill on your cardiac medications before your next appointment, please call your pharmacy*   Lab Work: NONE If you have labs (blood work) drawn today and your tests are completely normal, you will receive your results only by: MyChart Message (if you have MyChart) OR A paper copy in the mail If you have any lab test that is abnormal or we need to change your treatment, we will call you to review the results.   Testing/Procedures: NONE   Follow-Up: At [facility], you and your health needs are our priority.  As part of our continuing mission to provide you with exceptional heart care, we have created designated Provider Care Teams.  These Care Teams include your primary Cardiologist (physician) and Advanced Practice Providers (APPs -  Physician Assistants and Nurse Practitioners) who all work together to provide you with the care you need, when you need it.  We recommend signing up for the patient portal called "MyChart".  Sign up information is provided on this After Visit Summary.  MyChart is used to connect with patients for Virtual Visits (Telemedicine).  Patients are able to view lab/test results, encounter notes, upcoming appointments, etc.  Non-urgent messages can be sent to your provider as well.   To learn more about what you can do with MyChart, go to [email or web address].    Your next appointment:   5-6 month(s)  Provider:   Charlton Haws, MD

## 2023-04-15 ENCOUNTER — Encounter (HOSPITAL_COMMUNITY): Payer: Self-pay

## 2023-04-25 ENCOUNTER — Telehealth (HOSPITAL_COMMUNITY): Payer: Self-pay

## 2023-04-25 NOTE — Telephone Encounter (Signed)
Pt would like to attend the [place] location for cardiac rehab. Fax referral over. Closed referral.

## 2023-05-03 ENCOUNTER — Other Ambulatory Visit: Payer: Self-pay | Admitting: *Deleted

## 2023-05-03 DIAGNOSIS — Z952 Presence of prosthetic heart valve: Secondary | ICD-10-CM

## 2023-09-14 ENCOUNTER — Encounter: Payer: Self-pay | Admitting: Cardiovascular Disease

## 2023-10-22 ENCOUNTER — Encounter: Payer: Self-pay | Admitting: Cardiovascular Disease

## 2023-10-24 ENCOUNTER — Other Ambulatory Visit: Payer: Self-pay

## 2023-10-24 ENCOUNTER — Other Ambulatory Visit: Payer: Self-pay | Admitting: Cardiology

## 2023-10-24 NOTE — Telephone Encounter (Signed)
 Dr. Fabio Bering pt. He is requesting a refill of Hydralazine which was prescribed by another Dr and we haven't refilled it either. Does Dr. Eden Emms want to refill? Please advise.

## 2023-10-27 MED ORDER — HYDRALAZINE HCL 25 MG PO TABS: 25.0000 mg | ORAL_TABLET | Freq: Two times a day (BID) | ORAL | 1 refills | Status: DC

## 2023-10-27 NOTE — Telephone Encounter (Signed)
Received a message via pt schedule requesting an update ASAP on this medication being filled.   Please advise.

## 2023-10-27 NOTE — Telephone Encounter (Signed)
Dr. Fabio Bering pt. He is requesting a refill of Hydralazine which was prescribed by another Dr and we haven't refilled it either. Does Dr. Eden Emms want to refill? Please advise.

## 2023-12-22 ENCOUNTER — Other Ambulatory Visit: Payer: Self-pay | Admitting: Physician Assistant

## 2023-12-22 DIAGNOSIS — Z952 Presence of prosthetic heart valve: Secondary | ICD-10-CM

## 2023-12-23 ENCOUNTER — Other Ambulatory Visit: Payer: Self-pay

## 2023-12-23 MED ORDER — METOPROLOL SUCCINATE ER 25 MG PO TB24: 12.5000 mg | ORAL_TABLET | Freq: Every day | ORAL | 3 refills | Status: AC

## 2024-02-24 ENCOUNTER — Encounter: Payer: Self-pay | Admitting: Cardiovascular Disease

## 2024-02-24 MED ORDER — HYDRALAZINE HCL 25 MG PO TABS: 25.0000 mg | ORAL_TABLET | Freq: Two times a day (BID) | ORAL | 0 refills | Status: DC

## 2024-03-12 ENCOUNTER — Encounter: Payer: Self-pay | Admitting: Physician Assistant

## 2024-03-12 ENCOUNTER — Ambulatory Visit: Attending: Physician Assistant | Admitting: Physician Assistant

## 2024-03-12 ENCOUNTER — Ambulatory Visit (HOSPITAL_COMMUNITY)
Admission: RE | Admit: 2024-03-12 | Discharge: 2024-03-12 | Disposition: A | Discharge status: Home / Self Care | Source: Ambulatory Visit | Attending: Cardiovascular Disease | Admitting: Cardiovascular Disease

## 2024-03-12 ENCOUNTER — Ambulatory Visit: Payer: Self-pay | Admitting: Physician Assistant

## 2024-03-12 VITALS — BP 120/56 | HR 54 | Ht 67.0 in | Wt 158.8 lb

## 2024-03-12 DIAGNOSIS — I10 Essential (primary) hypertension: Secondary | ICD-10-CM

## 2024-03-12 DIAGNOSIS — R911 Solitary pulmonary nodule: Secondary | ICD-10-CM | POA: Diagnosis not present

## 2024-03-12 DIAGNOSIS — I5032 Chronic diastolic (congestive) heart failure: Secondary | ICD-10-CM

## 2024-03-12 DIAGNOSIS — I2510 Atherosclerotic heart disease of native coronary artery without angina pectoris: Secondary | ICD-10-CM

## 2024-03-12 DIAGNOSIS — Z952 Presence of prosthetic heart valve: Secondary | ICD-10-CM

## 2024-03-12 DIAGNOSIS — R351 Nocturia: Secondary | ICD-10-CM

## 2024-03-12 LAB — ECHOCARDIOGRAM COMPLETE
AR max vel: 1.7 cm2
AV Area VTI: 1.74 cm2
AV Area mean vel: 1.64 cm2
AV Mean grad: 17 mmHg
AV Peak grad: 31.6 mmHg
Ao pk vel: 2.81 m/s
Area-P 1/2: 3.03 cm2
S' Lateral: 2.7 cm

## 2024-03-12 NOTE — Patient Instructions (Addendum)
 Medication Instructions:  Your physician recommends that you continue on your current medications as directed. Please refer to the Current Medication list given to you today.  *If you need a refill on your cardiac medications before your next appointment, please call your pharmacy*  Lab Work: NONE If you have labs (blood work) drawn today and your tests are completely normal, you will receive your results only by: MyChart Message (if you have MyChart) OR A paper copy in the mail If you have any lab test that is abnormal or we need to change your treatment, we will call you to review the results.  Testing/Procedures: Your physician has requested that you have an echocardiogram 3 MONTHS. Echocardiography is a painless test that uses sound waves to create images of your heart. It provides your doctor with information about the size and shape of your heart and how well your heart's chambers and valves are working. This procedure takes approximately one hour. There are no restrictions for this procedure. Please do NOT wear cologne, perfume, aftershave, or lotions (deodorant is allowed). Please arrive 15 minutes prior to your appointment time.  Please note: We ask at that you not bring children with you during ultrasound (echo/ vascular) testing. Due to room size and safety concerns, children are not allowed in the ultrasound rooms during exams. Our front office staff cannot provide observation of children in our lobby area while testing is being conducted. An adult accompanying a patient to their appointment will only be allowed in the ultrasound room at the discretion of the ultrasound technician under special circumstances. We apologize for any inconvenience.   Non-Cardiac CT scanning, (CAT scanning), is a noninvasive, special x-ray that produces cross-sectional images of the body using x-rays and a computer. CT scans help physicians diagnose and treat medical conditions. For some CT exams, a contrast  material is used to enhance visibility in the area of the body being studied. CT scans provide greater clarity and reveal more details than regular x-ray exams.    Follow-Up: At [facility], you and your health needs are our priority.  As part of our continuing mission to provide you with exceptional heart care, our providers are all part of one team.  This team includes your primary Cardiologist (physician) and Advanced Practice Providers or APPs (Physician Assistants and Nurse Practitioners) who all work together to provide you with the care you need, when you need it.  Your next appointment:   Your physician recommends that you schedule a follow-up appointment in: 3 MONTHS WITH DR. DELFORD    We recommend signing up for the patient portal called MyChart.  Sign up information is provided on this After Visit Summary.  MyChart is used to connect with patients for Virtual Visits (Telemedicine).  Patients are able to view lab/test results, encounter notes, upcoming appointments, etc.  Non-urgent messages can be sent to your provider as well.   To learn more about what you can do with MyChart, go to [email or web address].

## 2024-03-12 NOTE — Progress Notes (Signed)
 HEART AND VASCULAR CENTER   MULTIDISCIPLINARY HEART VALVE CLINIC                                     Cardiology Office Note:    Date:  03/12/2024   ID:  SHIRLEY BOLLE, DOB 1946-01-29, MRN [id number]  PCP:  Pura Lenis, MD  [facility] Cardiologist:  Maude Emmer, MD  (previously followed at [facility] but now follows with us ). [facility] Structural heart: Lonni Cash, MD [facility] Electrophysiologist:  None   Referring MD: Pura Lenis, MD   1 year s/p TAVR   History of Present Illness:    Randy Duncan is a 78 y.o. 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    S/p ascending aorta repair and AVR with a 21 mm Edwards Magna stented bioprosthetic valve in 2008 at [place] with Dr. Joesph. [facility] 02/04/23 with Dr. Dorisann showed a 55% proximal diagonal lesion, 30% mid RCA lesion and otherwise no significant obstruction, but did show an elevated left ventricular end-diastolic pressure with hypertension. Of note, the first diagonal lesion was thought to be 90% obstructed on prior cardiac catheterization in 2020 but then reduced to 50 to 60%.   He was then admitted to [facility] for acute CHF in 02/2023. Echo 02/28/23 showed EF 60-65%, mild RVE, mild MR, mild to mod TR, mild AS (mean gradient 20mm hg, AVA 1.46cm2) with severe bioprosthetic AR with a PHT of . Subsequent TEE 03/04/23 confirmed severe central AI. S/p successful VIV TAVR via the TF approach by Dr. Cash & Lucas. Post op echos have shown normal LV function and normal TAVR function with a mean gradient of ~ 12 mm hg. He has decided to stay with Dr. Emmer with [facility] for his cardiology care.   Today the patient presents to clinic for follow up. He is here alone. Has a list of 17 things to talk about including toe fungus,  financial issues and anger issues. Golfing three days a wekk with no limitation. No CP or SOB. No LE edema, orthopnea or PND. No dizziness or syncope. No blood in stool or urine. No palpitations. Not taking any lasix  at all. He suffers from frequent nocturia and incontinence. Also wonders if he can use testosterone.    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 Lucas   Sleep disorder    Snoring      Current Medications: Current Meds  Medication Sig   acetaminophen  (TYLENOL ) 500 MG tablet Take 1,000 mg by mouth as needed for mild pain or moderate pain.   aspirin  EC 81 MG tablet Take 81 mg by mouth in the morning. Swallow whole.   atorvastatin  (LIPITOR) 80 MG tablet Take 80 mg by mouth at bedtime.   Cholecalciferol (VITAMIN D3) 125 MCG (5000 UT) CAPS Take 5,000 Units by mouth in the morning.   clonazePAM  (KLONOPIN ) 0.5  MG tablet Take 0.25 mg by mouth every 4 (four) hours.   hydrALAZINE  (APRESOLINE ) 25 MG tablet Take 1 tablet (25 mg total) by mouth in the morning and at bedtime.   isosorbide  mononitrate (IMDUR ) 30 MG 24 hr tablet Take 0.5 tablets (15 mg total) by mouth daily.   metoprolol  succinate (TOPROL -XL) 25 MG 24 hr tablet Take 0.5 tablets (12.5 mg total) by mouth at bedtime.   ranolazine  (RANEXA ) 1000 MG SR tablet Take 1,000 mg by mouth in the morning and at bedtime.   ZYRTEC ALLERGY 10 MG tablet Take 10 mg by mouth See admin instructions. Take 10 mg by mouth in the evening-bedtime, along with the 2nd daily dose of Hydralazine       ROS:   Please see the history of present illness.    All other systems reviewed and are negative.  EKGs       Risk  Assessment/Calculations:           Physical Exam:    VS:  BP (!) 120/56 (BP Location: Right Arm, Patient Position: Sitting, Cuff Size: Normal)   Pulse (!) 54   Ht 5' 7 (1.702 m)   Wt 158 lb 12.8 oz (72 kg)   SpO2 97%   BMI 24.87 kg/m     Wt Readings from Last 3 Encounters:  03/12/24 158 lb 12.8 oz (72 kg)  04/11/23 154 lb 3.2 oz (69.9 kg)  03/21/23 148 lb 6.4 oz (67.3 kg)     GEN: Well nourished, well developed in no acute distress NECK: No JVD CARDIAC: RRR, soft flow murmur @ RUSB. No rubs, gallops RESPIRATORY:  Clear to auscultation without rales, wheezing or rhonchi  ABDOMEN: Soft, non-tender, non-distended EXTREMITIES:  No edema; No deformity.   ASSESSMENT:    1. S/P TAVR (transcatheter aortic valve replacement)   2. Chronic heart failure with preserved ejection fraction (HCC)   3. Coronary artery disease involving native coronary artery of native heart without angina pectoris   4. Essential hypertension   5. Pulmonary nodule   6. Nocturia     PLAN:    In order of problems listed above:  Severe AS s/p VIV TAVR:  -- Echo today shows EF 60%, mild RVE/dysfunction, normally functioning TAVR with a mean gradient of 17 mm hg and no PVL. Gradients are mildly elevated from previous (12 --> 17 mm hg). Will repeat in echo in 3 months to follow trend. If gradients continue to rise, will set up for cardiac CT to rule out HALT/HAM.  -- NYHA class I symptoms.  -- Continue Aspirin  81mg  daily.  -- SBE discussed. He is edentulous and needs to get fit for dentures.  -- Continue regular follow up with Dr. Delford.    Chronic HFpEF:  -- Resolved since VIV TAVR. -- No longer requiring any diuretic therapy.  -- Appears euvolemic.    CAD:  -- LHC 02/04/23 with Dr. Dorisann which showed a 55% proximal diagonal lesion, 30% mid RCA lesion and otherwise no significant obstruction.  -- No chest pain. -- Continue aspirin  81mg  daily. -- Continue atorvastatin  80mg  daily.  -- Continue  ranolazine  SR 1000 mg BID -- Continue Imdur  15mg  daily.     HTN:  -- Bp well controlled.  -- Continue hydralazine  25mg  BID. -- Continue imdur  15mg  daily.  -- Continue metoprolol  succinate 12.5mg  daily.   Pulmonary nodule:  -- Noted on pre TAVR CT.  -- Never followed up on, will get scheduled now.   Nocturia/incontinence:  -- Will send  to urology. He also would like to discuss TRT.    Medication Adjustments/Labs and Tests Ordered: Current medicines are reviewed at length with the patient today.  Concerns regarding medicines are outlined above.  Orders Placed This Encounter  Procedures   CT Chest Wo Contrast   Ambulatory referral to Urology   ECHOCARDIOGRAM COMPLETE   No orders of the defined types were placed in this encounter.   Patient Instructions  Medication Instructions:  Your physician recommends that you continue on your current medications as directed. Please refer to the Current Medication list given to you today.  *If you need a refill on your cardiac medications before your next appointment, please call your pharmacy*  Lab Work: NONE If you have labs (blood work) drawn today and your tests are completely normal, you will receive your results only by: MyChart Message (if you have MyChart) OR A paper copy in the mail If you have any lab test that is abnormal or we need to change your treatment, we will call you to review the results.  Testing/Procedures: Your physician has requested that you have an echocardiogram 3 MONTHS. Echocardiography is a painless test that uses sound waves to create images of your heart. It provides your doctor with information about the size and shape of your heart and how well your heart's chambers and valves are working. This procedure takes approximately one hour. There are no restrictions for this procedure. Please do NOT wear cologne, perfume, aftershave, or lotions (deodorant is allowed). Please arrive 15 minutes prior to your  appointment time.  Please note: We ask at that you not bring children with you during ultrasound (echo/ vascular) testing. Due to room size and safety concerns, children are not allowed in the ultrasound rooms during exams. Our front office staff cannot provide observation of children in our lobby area while testing is being conducted. An adult accompanying a patient to their appointment will only be allowed in the ultrasound room at the discretion of the ultrasound technician under special circumstances. We apologize for any inconvenience.   Non-Cardiac CT scanning, (CAT scanning), is a noninvasive, special x-ray that produces cross-sectional images of the body using x-rays and a computer. CT scans help physicians diagnose and treat medical conditions. For some CT exams, a contrast material is used to enhance visibility in the area of the body being studied. CT scans provide greater clarity and reveal more details than regular x-ray exams.    Follow-Up: At [facility], you and your health needs are our priority.  As part of our continuing mission to provide you with exceptional heart care, our providers are all part of one team.  This team includes your primary Cardiologist (physician) and Advanced Practice Providers or APPs (Physician Assistants and Nurse Practitioners) who all work together to provide you with the care you need, when you need it.  Your next appointment:   Your physician recommends that you schedule a follow-up appointment in: 3 MONTHS WITH DR. DELFORD    We recommend signing up for the patient portal called MyChart.  Sign up information is provided on this After Visit Summary.  MyChart is used to connect with patients for Virtual Visits (Telemedicine).  Patients are able to view lab/test results, encounter notes, upcoming appointments, etc.  Non-urgent messages can be sent to your provider as well.   To learn more about what you can do with MyChart, go to  [email or web address].      Signed, Lamarr Hummer, PA-C  03/12/2024 2:18  PM    [facility]

## 2024-03-14 ENCOUNTER — Ambulatory Visit: Payer: Medicare HMO

## 2024-03-14 ENCOUNTER — Other Ambulatory Visit (HOSPITAL_COMMUNITY): Payer: Medicare HMO

## 2024-03-26 ENCOUNTER — Ambulatory Visit (HOSPITAL_COMMUNITY)
Admission: RE | Admit: 2024-03-26 | Discharge: 2024-03-26 | Disposition: A | Discharge status: Home / Self Care | Source: Ambulatory Visit | Attending: Physician Assistant | Admitting: Physician Assistant

## 2024-03-26 ENCOUNTER — Encounter (HOSPITAL_COMMUNITY): Payer: Self-pay

## 2024-03-26 DIAGNOSIS — I700 Atherosclerosis of aorta: Secondary | ICD-10-CM | POA: Diagnosis not present

## 2024-03-26 DIAGNOSIS — Z952 Presence of prosthetic heart valve: Secondary | ICD-10-CM | POA: Diagnosis present

## 2024-03-26 DIAGNOSIS — R918 Other nonspecific abnormal finding of lung field: Secondary | ICD-10-CM | POA: Diagnosis present

## 2024-03-29 ENCOUNTER — Ambulatory Visit: Payer: Self-pay | Admitting: Physician Assistant

## 2024-05-15 ENCOUNTER — Other Ambulatory Visit (HOSPITAL_COMMUNITY)

## 2024-06-12 ENCOUNTER — Ambulatory Visit (HOSPITAL_COMMUNITY)
Admission: RE | Admit: 2024-06-12 | Discharge: 2024-06-12 | Disposition: A | Discharge status: Home / Self Care | Source: Ambulatory Visit | Attending: Internal Medicine | Admitting: Internal Medicine

## 2024-06-12 DIAGNOSIS — Z952 Presence of prosthetic heart valve: Secondary | ICD-10-CM | POA: Diagnosis present

## 2024-06-12 LAB — ECHOCARDIOGRAM COMPLETE
AR max vel: 1.97 cm2
AV Area VTI: 2.07 cm2
AV Area mean vel: 1.97 cm2
AV Mean grad: 15 mmHg
AV Peak grad: 27.2 mmHg
Ao pk vel: 2.61 m/s
Area-P 1/2: 3.31 cm2
S' Lateral: 2.7 cm

## 2024-06-12 NOTE — Progress Notes (Signed)
 Cardiology Office Note:    Date:  06/25/2024   ID:  Randy Duncan, DOB September 08, 1946, MRN [id number]  PCP:  Pura Lenis, MD  [facility] Cardiologist:  Maude Emmer, MD  (previously followed at [facility] but now follows with us ). [facility] Structural heart: Lonni Cash, MD [facility] Electrophysiologist:  None   Referring MD: Pura Lenis, MD    History of Present Illness:    Randy Duncan is a 78 y.o. 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    S/p ascending aorta repair and AVR with a 21 mm Edwards Magna stented bioprosthetic valve in 2008 at [facility] with Dr. Joesph. [facility] 02/04/23 with Dr. Dorisann showed a 55% proximal diagonal lesion, 30% mid RCA lesion and otherwise no significant obstruction, but did show an elevated left ventricular end-diastolic pressure with hypertension. Of note, the first diagonal lesion was thought to be 90% obstructed on prior cardiac catheterization in 2020 but then reduced to 50 to 60%.   He was then admitted to [facility] for acute CHF in 02/2023. Echo 02/28/23 showed EF 60-65%, mild RVE, mild MR, mild to mod TR, mild AS (mean gradient 20mm hg, AVA 1.46cm2) with severe bioprosthetic AR with a PHT of . Subsequent TEE 03/04/23 confirmed severe central AI. S/p successful VIV TAVR via the TF approach by Dr. Cash & [facility] using a 23 mm Sapien 3 valve. Post op echos have shown normal LV function and normal TAVR function with a mean gradient of ~ 12 mm hg.   Has no signs of CHF, angina or syncope. He suffers from frequent nocturia and incontinence. Also wonders if he can use testosterone. 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 Lucas   Sleep disorder    Snoring      Current Medications: Current Meds  Medication Sig   acetaminophen  (TYLENOL ) 500 MG tablet Take 1,000 mg by mouth as needed for mild pain or moderate pain.   aspirin  EC 81 MG tablet Take 81 mg by mouth in the morning. Swallow whole.   atorvastatin  (LIPITOR) 80 MG tablet Take 80 mg by mouth at bedtime.   Cholecalciferol (VITAMIN D3) 125 MCG (5000 UT) CAPS Take 5,000 Units by mouth in the morning.   clonazePAM  (KLONOPIN ) 0.5 MG tablet Take 0.25 mg by mouth every 4 (four) hours.   hydrALAZINE  (APRESOLINE ) 25 MG tablet TAKE 1 TABLET (25 MG) BY MOUTH IN THE MORNING AND AT BEDTIME   isosorbide  mononitrate (IMDUR ) 30 MG 24 hr tablet Take 0.5 tablets (15 mg total) by mouth daily.   metoprolol  succinate (TOPROL -XL) 25 MG 24 hr tablet Take 0.5 tablets (  12.5 mg total) by mouth at bedtime.   ranolazine  (RANEXA ) 1000 MG SR tablet Take 1,000 mg by mouth in the morning and at bedtime.   ZYRTEC ALLERGY 10 MG tablet Take 10 mg by mouth See admin instructions. Take 10 mg by mouth in the evening-bedtime, along with the 2nd daily dose of Hydralazine       ROS:   Please see the history of present illness.    All other systems reviewed and are negative.  EKGs     03/21/23 NSR rate 61 normal intervals      Physical Exam:    VS:  BP 122/60 (BP Location: Left Arm, Patient Position: Sitting)   Pulse 60   Ht 5' 7 (1.702 m)   Wt 157 lb 6.4 oz (71.4 kg)   SpO2 97%   BMI 24.65 kg/m     Wt Readings from Last 3 Encounters:  06/25/24 157 lb 6.4 oz (71.4 kg)  03/12/24 158 lb 12.8 oz (72 kg)  04/11/23 154 lb 3.2 oz (69.9 kg)     Affect  appropriate Elderly male  HEENT: normal Neck supple with no adenopathy JVP normal no bruits no thyromegaly Lungs clear with no wheezing and good diaphragmatic motion Heart:  S1/S SEM murmur,  no AR audible prior sternotimy Abdomen: benighn, BS positve, no tenderness, no AAA no bruit.  No HSM or HJR Distal pulses intact with no bruits No edema Neuro non-focal Skin warm and dry No muscular weakness     PLAN:    In order of problems listed above:  Severe AS s/p VIV TAVR:  -- Echo 06/12/24  shows EF 60-65%, mild RVE/dysfunction, 23 mm Sapien 3 valve with no PVL or central AR mean gradient 15 peak 27 mmHg with AVA 2.0 cm2 and DVI 0.54   -- NYHA class I symptoms.  -- Continue Aspirin  81mg  daily.  -- SBE discussed. He is edentulous and needs to get fit for dentures.     Chronic HFpEF:  -- Resolved since VIV TAVR. -- No longer requiring any diuretic therapy.  -- Appears euvolemic.    CAD:  -- LHC 02/04/23 with Dr. Dorisann which showed a 55% proximal diagonal lesion, 30% mid RCA lesion and otherwise no significant obstruction.  -- No chest pain. -- Continue aspirin  81mg  daily. -- Continue atorvastatin  80mg  daily.  -- Continue ranolazine  SR 1000 mg BID -- Continue Imdur  15mg  daily.     HTN:  -- Bp well controlled.  -- Continue hydralazine  25mg  BID. -- Continue imdur  15mg  daily.  -- Continue metoprolol  succinate 12.5mg  daily.   Pulmonary nodule:  -- Noted on pre TAVR CT.  -- Stable < 5 mm on CT 03/28/24  - no need for f/u per radiology   Nocturia/incontinence:  -- Start Detrol F/U Dr Carolee in Urology  Neuro: - seems to have cognitive decline - f/u primary ? alzheimer's    Medication Adjustments/Labs and Tests Ordered: Current medicines are reviewed at length with the patient today.  Concerns regarding medicines are outlined above.  Orders Placed This Encounter  Procedures   EKG 12-Lead   No orders of the defined types were placed in this encounter.   There are no  Patient Instructions on file for this visit.   Signed, Maude Emmer, MD  06/25/2024 10:13 AM    [facility]

## 2024-06-22 ENCOUNTER — Other Ambulatory Visit: Payer: Self-pay | Admitting: Physician Assistant

## 2024-06-25 ENCOUNTER — Encounter: Payer: Self-pay | Admitting: Cardiovascular Disease

## 2024-06-25 ENCOUNTER — Ambulatory Visit: Attending: Cardiovascular Disease | Admitting: Cardiovascular Disease

## 2024-06-25 VITALS — BP 122/60 | HR 60 | Ht 67.0 in | Wt 157.4 lb

## 2024-06-25 DIAGNOSIS — R351 Nocturia: Secondary | ICD-10-CM

## 2024-06-25 DIAGNOSIS — I5032 Chronic diastolic (congestive) heart failure: Secondary | ICD-10-CM

## 2024-06-25 DIAGNOSIS — Z952 Presence of prosthetic heart valve: Secondary | ICD-10-CM

## 2024-06-25 NOTE — Patient Instructions (Signed)
 Medication Instructions:  Your physician recommends that you continue on your current medications as directed. Please refer to the Current Medication list given to you today.  *If you need a refill on your cardiac medications before your next appointment, please call your pharmacy*  Lab Work: none If you have labs (blood work) drawn today and your tests are completely normal, you will receive your results only by: MyChart Message (if you have MyChart) OR A paper copy in the mail If you have any lab test that is abnormal or we need to change your treatment, we will call you to review the results.  Testing/Procedures: none  Follow-Up: At [facility], you and your health needs are our priority.  As part of our continuing mission to provide you with exceptional heart care, our providers are all part of one team.  This team includes your primary Cardiologist (physician) and Advanced Practice Providers or APPs (Physician Assistants and Nurse Practitioners) who all work together to provide you with the care you need, when you need it.  Your next appointment:   1 year(s)  Provider:   Maude Emmer, MD    We recommend signing up for the patient portal called MyChart.  Sign up information is provided on this After Visit Summary.  MyChart is used to connect with patients for Virtual Visits (Telemedicine).  Patients are able to view lab/test results, encounter notes, upcoming appointments, etc.  Non-urgent messages can be sent to your provider as well.   To learn more about what you can do with MyChart, go to [email or web address].   Other Instructions none

## 2024-07-18 ENCOUNTER — Encounter: Payer: Self-pay | Admitting: Neurology

## 2024-12-11 ENCOUNTER — Institutional Professional Consult (permissible substitution): Admitting: Neurology
